# Patient Record
Sex: Male | Born: 1950 | Race: White | Hispanic: No | Marital: Single | Smoking: Former smoker
Health system: Southern US, Community
[De-identification: ages and names within clinical notes are randomized; demographics above are authoritative.]

## PROBLEM LIST (undated history)

## (undated) DIAGNOSIS — E119 Type 2 diabetes mellitus without complications: Secondary | ICD-10-CM

## (undated) DIAGNOSIS — K219 Gastro-esophageal reflux disease without esophagitis: Secondary | ICD-10-CM

## (undated) DIAGNOSIS — J449 Chronic obstructive pulmonary disease, unspecified: Secondary | ICD-10-CM

## (undated) DIAGNOSIS — J8410 Pulmonary fibrosis, unspecified: Secondary | ICD-10-CM

## (undated) DIAGNOSIS — R0600 Dyspnea, unspecified: Secondary | ICD-10-CM

## (undated) DIAGNOSIS — M069 Rheumatoid arthritis, unspecified: Secondary | ICD-10-CM

## (undated) DIAGNOSIS — I10 Essential (primary) hypertension: Secondary | ICD-10-CM

## (undated) HISTORY — PX: NECK SURGERY: SHX720

## (undated) HISTORY — DX: Dyspnea, unspecified: R06.00

## (undated) HISTORY — DX: Rheumatoid arthritis, unspecified: M06.9

## (undated) HISTORY — PX: EYE SURGERY: SHX253

## (undated) HISTORY — DX: Type 2 diabetes mellitus without complications: E11.9

## (undated) HISTORY — DX: Pulmonary fibrosis, unspecified: J84.10

---

## 2003-03-13 ENCOUNTER — Ambulatory Visit (HOSPITAL_COMMUNITY): Admission: RE | Admit: 2003-03-13 | Discharge: 2003-03-13 | Payer: Self-pay | Admitting: Orthopedic Surgery

## 2003-03-27 DIAGNOSIS — M069 Rheumatoid arthritis, unspecified: Secondary | ICD-10-CM

## 2003-03-27 HISTORY — DX: Rheumatoid arthritis, unspecified: M06.9

## 2003-04-30 ENCOUNTER — Inpatient Hospital Stay (HOSPITAL_COMMUNITY): Admission: RE | Admit: 2003-04-30 | Discharge: 2003-05-02 | Payer: Self-pay | Admitting: Neurosurgery

## 2003-05-10 ENCOUNTER — Ambulatory Visit (HOSPITAL_COMMUNITY): Admission: RE | Admit: 2003-05-10 | Discharge: 2003-05-10 | Payer: Self-pay | Admitting: Neurosurgery

## 2004-06-22 ENCOUNTER — Ambulatory Visit (HOSPITAL_BASED_OUTPATIENT_CLINIC_OR_DEPARTMENT_OTHER): Admission: RE | Admit: 2004-06-22 | Discharge: 2004-06-22 | Payer: Self-pay | Admitting: Orthopedic Surgery

## 2004-06-22 ENCOUNTER — Ambulatory Visit (HOSPITAL_COMMUNITY): Admission: RE | Admit: 2004-06-22 | Discharge: 2004-06-22 | Payer: Self-pay | Admitting: Orthopedic Surgery

## 2004-06-22 ENCOUNTER — Encounter (INDEPENDENT_AMBULATORY_CARE_PROVIDER_SITE_OTHER): Payer: Self-pay | Admitting: *Deleted

## 2004-07-03 ENCOUNTER — Ambulatory Visit: Payer: Self-pay | Admitting: Infectious Diseases

## 2007-12-19 ENCOUNTER — Ambulatory Visit (HOSPITAL_COMMUNITY): Admission: RE | Admit: 2007-12-19 | Discharge: 2007-12-19 | Payer: Self-pay | Admitting: Internal Medicine

## 2007-12-30 ENCOUNTER — Ambulatory Visit: Payer: Self-pay | Admitting: Internal Medicine

## 2007-12-30 DIAGNOSIS — J8410 Pulmonary fibrosis, unspecified: Secondary | ICD-10-CM | POA: Insufficient documentation

## 2008-02-12 ENCOUNTER — Ambulatory Visit: Payer: Self-pay | Admitting: Internal Medicine

## 2008-03-17 ENCOUNTER — Telehealth (INDEPENDENT_AMBULATORY_CARE_PROVIDER_SITE_OTHER): Payer: Self-pay | Admitting: *Deleted

## 2008-03-26 HISTORY — PX: OTHER SURGICAL HISTORY: SHX169

## 2008-06-24 ENCOUNTER — Telehealth (INDEPENDENT_AMBULATORY_CARE_PROVIDER_SITE_OTHER): Payer: Self-pay | Admitting: *Deleted

## 2008-07-29 ENCOUNTER — Ambulatory Visit: Payer: Self-pay | Admitting: Internal Medicine

## 2008-07-29 ENCOUNTER — Encounter: Payer: Self-pay | Admitting: Internal Medicine

## 2008-08-19 ENCOUNTER — Encounter (INDEPENDENT_AMBULATORY_CARE_PROVIDER_SITE_OTHER): Payer: Self-pay | Admitting: Orthopedic Surgery

## 2008-08-19 ENCOUNTER — Ambulatory Visit (HOSPITAL_BASED_OUTPATIENT_CLINIC_OR_DEPARTMENT_OTHER): Admission: RE | Admit: 2008-08-19 | Discharge: 2008-08-19 | Payer: Self-pay | Admitting: Orthopedic Surgery

## 2009-05-26 ENCOUNTER — Encounter: Payer: Self-pay | Admitting: Internal Medicine

## 2009-06-10 ENCOUNTER — Ambulatory Visit: Payer: Self-pay | Admitting: Internal Medicine

## 2009-08-02 ENCOUNTER — Encounter: Payer: Self-pay | Admitting: Internal Medicine

## 2009-10-10 ENCOUNTER — Ambulatory Visit: Payer: Self-pay | Admitting: Internal Medicine

## 2009-10-10 DIAGNOSIS — R05 Cough: Secondary | ICD-10-CM | POA: Insufficient documentation

## 2010-01-03 ENCOUNTER — Telehealth (INDEPENDENT_AMBULATORY_CARE_PROVIDER_SITE_OTHER): Payer: Self-pay | Admitting: *Deleted

## 2010-01-04 ENCOUNTER — Encounter: Payer: Self-pay | Admitting: Internal Medicine

## 2010-01-10 ENCOUNTER — Ambulatory Visit: Payer: Self-pay | Admitting: Internal Medicine

## 2010-04-04 ENCOUNTER — Encounter: Payer: Self-pay | Admitting: Internal Medicine

## 2010-04-18 ENCOUNTER — Encounter: Payer: Self-pay | Admitting: Internal Medicine

## 2010-04-18 ENCOUNTER — Ambulatory Visit
Admission: RE | Admit: 2010-04-18 | Discharge: 2010-04-18 | Payer: Self-pay | Source: Home / Self Care | Attending: Internal Medicine | Admitting: Internal Medicine

## 2010-04-25 NOTE — Assessment & Plan Note (Signed)
Summary: Pulmonary/ ext ov    Copy to:  Dr. Perrin Maltese Primary Provider/Referring Provider:  Perrin Maltese  CC:  Followup per Dr Corliss Skains.  Pt states no complaints today.  Breathing fine.  Marland Kitchen  History of Present Illness: 60   yowm  with RA quit smoking  11/09   wt between 200 -215 when  quit smoking.   60 December 30, 2007  seen at Dr Ernestene Mention request for ? PF  after being seen for a "cold" now back to normal. denies doe/  problems at work climbing up and down step but not aerobically active and has learned to take his time.     Jul 29, 2008 ov denies limiting doe no cough, arthtriits is  controlled with as needed indomethacin "a couple of times a year"    June 10, 2009 cc cough Followup per Dr Corliss Skains.  Pt states no complaints today.  Breathing fine.  Coughing is sporadic day and night with no real pattern.  Pt denies any significant sore throat, dysphagia, itching, sneezing,  nasal congestion or excess secretions,  fever, chills, sweats, unintended wt loss, pleuritic or exertional cp, hempoptysis, change in activity tolerance  orthopnea pnd or leg swelling. Pt also denies any obvious fluctuation in symptoms with weather or environmental change or other alleviating or aggravating factors.       Current Medications (verified): 1)  Humira 20 Mg/0.49ml Kit (Adalimumab) .... As Directed Every 2 Wks.  Allergies (verified): No Known Drug Allergies  Past History:  Past Medical History: Rhematoid arthritis 2005.........................................................Marland KitchenDeveshwar Pulmonary fibrosis ? 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 Nl external ear canals without cough reflex Neck without JVD/Nodes/TM Lungs subtle bronhovesicular changes in bases with minimal insp crackles  without cough on insp or exp maneuvers RRR no s3 or murmur or increase in P2. No edema Abd soft and benign with nl excursion in the supine position. 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  See instructions for specific recommendations   Medications Added to Medication List This Visit: 1)  Humira 20 Mg/0.20ml Kit (Adalimumab) .... As directed every 2 wks.  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 Patient Level IV (02725) - Signed

## 2010-04-25 NOTE — Assessment & Plan Note (Signed)
Summary: Pulmonary/ ext ov with sats = 86% p one lap just started pred    Copy to:  Dr. Perrin Mccarthy Primary Provider/Referring Provider:  Perrin Mccarthy  CC:  Cough- the same.  History of Present Illness: 59   yowm  with RA quit smoking  11/09   wt between 200 -215 when  quit smoking.   December 30, 2007  seen at Dr [name] request for ? PF  after being seen for a "cold" now back to normal. denies doe/  problems at work climbing up and down step but not aerobically active and has learned to take his time.     Jul 29, 2008 ov denies limiting doe no cough, arthtriits is  controlled with as needed indomethacin "a couple of times a year"    June 10, 2009 cc cough Followup per Dr [name].  Pt states no complaints today.  Breathing fine.  Coughing is sporadic day and night with no real pattern.    October 10, 2009 Followup with PFT's.  Pt states breathing is fine.  He states that he coughs "all the time"- non prod.  no real pattern to cough but mostly daytime.  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 says no one ever explains anything to him (see quote from instructions July 18).  Pt denies any significant sore throat, dysphagia, itching, sneezing,  nasal congestion or excess secretions,  fever, chills, sweats, unintended wt loss, pleuritic or exertional cp, hempoptysis, change in activity tolerance  orthopnea pnd or leg swelling   Current Medications (verified): 1)  Humira 20 Mg/0.57ml Kit (Adalimumab) .... As  Directed Every 2 Wks. 2)  Azathioprine 50 Mg Tabs (Azathioprine) .Marland Kitchen.. 1 Two Times A Day 3)  Prednisone 5 Mg Tabs (Prednisone) .Marland Kitchen.. 1 Once Daily  Allergies (verified): No Known Drug Allergies  Past History:  Past Medical History: Rhematoid arthritis 2005.........................................................Marland KitchenDeveshwar Pulmonary fibrosis ? secondary to RA...................................Marland KitchenWert    - CT chest 05/10/2007    - CT chest 12/19/2007    - PFTs 02/12/08 vital capacity 3.45 or 76% predicted DLCO 69% predicted    - PFT's   07/29/08  vital capacity 3.33  or 74%                DLC0   55%    - PFT's   10/10/09 vital capacity2.72 or 75% pred and DLC0   44% with desat x 1 lap to 85%    - Desat tp 86 with pulse 110 p one lap  Vital Signs:  Patient profile:   60 year old male Weight:      220 pounds O2 Sat:      93 % on Room air Temp:     97.7 degrees F oral Pulse rate:   96 / minute BP sitting:   130 / 90  (left arm)  Vitals Entered By: [name] RCP, LPN (January 10, 2010 11:41 AM)  O2 Flow:  Room air  Serial Vital Signs/Assessments:  Comments: 11:59 AM Ambulatory Pulse Oximetry  Resting; HR__94___    02 Sat__105___  Lap1 (185 feet)   HR__110___   02 Sat__86%ra___ Lap2 (185 feet)   HR_____   02 Sat_____    Lap3 (185 feet)   HR_____   02 Sat_____  ___Test Completed without Difficulty _x__Test Stopped due to: pt c/o feeling SOB and o2 sats dropped- sat increased form 86%ra to 94%ra after resting approx 20 sec.   By: [name]   CC: Cough- the same Comments Medications reviewed with patient [name] RCP, LPN  January 10, 2010 11:41 AM    Physical Exam  Additional Exam:  obese stoic wm answers questions with yes and no only or grunts with unusual affect, and  to answer a single question asked in a straightforward manner, tending to go off on tangents or answer questions with ambiguous medical terms or diagnoses and seemed upset when asked the same  question more than once for clarification.  wt 219 December 30, 2007  > 218 Jul 29, 2008 > 219 June 10, 2009 > 215 October 10, 2009 > 220 January 11, 2010  HEENT: nl dentition, turbinates, and orophanx. Nl external ear canals without cough reflex Neck without JVD/Nodes/TM Lungs subtle bronhovesicular changes in bases with minimal insp crackles  without cough on insp or exp maneuvers RRR no s3 or murmur or increase in P2. No edema Abd soft and benign with nl excursion in the supine position. 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) I had an extended discussion with the patient today lasting 15 to 20 minutes of a 25 minute visit on the following issues:   Discussed in detail all the  indications, usual  risks and alternatives  relative to the benefits with patient who agrees to proceed with conservative fu,  though he seemed to have a great deal of difficulty absorbing these details requiring [place] to go over the same issues multiple times On this visit he denied we had the same conversation last time, denied he'd been told anything about having ild related to RA (given written comments as well which calls into question whetether he has occult dementia or illiteracy).  For now will step up f/u to q 3 months with 02 sats on each ov  (slt better today and too early to tell about response to pred trial)  Problem # 2:  COUGH (ICD-786.2)  Not typical of ILD since no relation to inspiratory activity at rest or with ex  The most common causes of chronic cough in immunocompetent adults include: upper airway cough syndrome (UACS), previously referred to as postnasal drip syndrome,  caused by variety of rhinosinus conditions; (2) asthma; (3) GERD; (4) chronic bronchitis from cigarette smoking or other inhaled environmental irritants; (5)  nonasthmatic eosinophilic bronchitis; and (6) bronchiectasis. These conditions, singly or in combination, have accounted for up to 94% of the causes of chronic cough in prospective studies.   Try max gerd rx if prednisone has no effect on cough  Orders: Est. Patient Level IV (45409)  Medications Added to Medication List This Visit: 1)  Prednisone 5 Mg Tabs (Prednisone) .Marland Kitchen.. 1 once daily  Other Orders: Pulse Oximetry, Ambulatory (81191)  Patient Instructions: 1)  See first if the prednisone helps (2 weeks) and if not  I recommend you take prilosec 20 mg  Take one 30-60 min before first and last meals of the day and pepcid 20 mg at bedtime for at least two weeks if not responding  2)  Keep follow up with Deveswar 3)  January 2011 follow up office visit with cxr and pft's

## 2010-04-25 NOTE — Letter (Signed)
Summary: [facility]  [facility]   Imported By: Lester [place] 01/19/2010 08:52:50  _____________________________________________________________________  External Attachment:    Type:   Image     Comment:   External Document

## 2010-04-25 NOTE — Miscellaneous (Signed)
Summary: Orders Update pft charges  Clinical Lists Changes  Orders: Added new Service order of Carbon Monoxide diffusing w/capacity (94720) - Signed Added new Service order of Lung Volumes (94240) - Signed Added new Service order of Spirometry (Pre & Post) (94060) - Signed 

## 2010-04-25 NOTE — Assessment & Plan Note (Signed)
Summary: Pulmonary/ ext summary f/u ov for RA/PFnow desat walking 185   Copy to:  Dr. Perrin Maltese Primary Provider/Referring Provider:  Perrin Maltese  CC:  Followup with PFT's.  Pt states breathing is fine.  He states that he coughs "all the time"- non prod .  History of Present Illness: 43   yowm  with RA quit smoking  11/09   wt between 200 -215 when  quit smoking.   December 30, 2007  seen at Dr Ernestene Mention request for ? PF  after being seen for a "cold" now back to normal. denies doe/  problems at work climbing up and down step but not aerobically active and has learned to take his time.     Jul 29, 2008 ov denies limiting doe no cough, arthtriits is  controlled with as needed indomethacin "a couple of times a year"    June 10, 2009 cc cough Followup per Dr Corliss Skains.  Pt states no complaints today.  Breathing fine.  Coughing is sporadic day and night with no real pattern.    October 10, 2009 Followup with PFT's.  Pt states breathing is fine.  He states that he coughs "all the time"- non prod.  no real pattern to cough but mostly daytime. Pt denies any significant sore throat, dysphagia, itching, sneezing,  nasal congestion or excess secretions,  fever, chills, sweats, unintended wt loss, pleuritic or exertional cp, hempoptysis, change in activity tolerance  orthopnea pnd or leg swelling Pt also denies any obvious fluctuation in symptoms with weather or environmental change or other alleviating or aggravating factors.       Current Medications (verified): 1)  Humira 20 Mg/0.41ml Kit (Adalimumab) .... As Directed Every 2 Wks. 2)  Azathioprine 50 Mg Tabs (Azathioprine) .Marland Kitchen.. 1 Two Times A Day  Allergies (verified): No Known Drug Allergies  Past History:  Past Medical History: Rhematoid arthritis 2005.........................................................Marland KitchenDeveshwar Pulmonary fibrosis ? secondary to RA...................................Marland KitchenWert    - CT chest 05/10/2007    - CT chest 12/19/2007    - PFTs  02/12/08 vital capacity 3.45 or 76% predicted DLCO 69% predicted    - PFT's   07/29/08  vital capacity 3.33  or 74%                DLC0   55%    - PFT's   10/10/09 vital capacity2.72 or 75% pred and DLC0   44% with desat x 1 lap  Vital Signs:  Patient profile:   60 year old male Weight:      215 pounds O2 Sat:      95 % on Room air Temp:     98.0 degrees F oral Pulse rate:   111 / minute BP sitting:   120 / 72  (left arm)  Vitals Entered By: Vernie Murders (October 10, 2009 4:28 PM)  O2 Flow:  Room air  Serial Vital Signs/Assessments:  Comments: 4:58 PM Ambulatory Pulse Oximetry  Resting; HR__99___    02 Sat__91%ra___  Lap1 (185 feet)   HR_113____   02 Sat__85%ra___ Lap2 (185 feet)   HR_____   02 Sat_____    Lap3 (185 feet)   HR_____   02 Sat_____  ___Test Completed without Difficulty _x__Test Stopped due to: decreased o2 sats- pt SOB, placed on o2 2lpm and sat increased to 93%2lpm   By: Vernie Murders    Physical Exam  Additional Exam:  obese stoic wm answers questions with yes and no only or grunts with unusual affect wt 219 December 30, 2007  > 218 Jul 29, 2008 > 219 June 10, 2009 > 215 October 10, 2009  HEENT: nl dentition, turbinates, and orophanx. Nl external ear canals without cough reflex Neck without JVD/Nodes/TM Lungs subtle bronhovesicular changes in bases with minimal insp crackles  without cough on insp or exp maneuvers RRR no s3 or murmur or increase in P2. No edema Abd soft and benign with nl excursion in the supine position. No bruits or organomegaly Ext warm without calf tenderness, cyanosis clubbing or extensor nodules    CXR  Procedure date:  10/10/2009  Findings:        CHEST - 2 VIEW 10/10/2009:   Comparison: Two-view chest x-ray 07/29/2008 and 04/30/2003.  CT chest 05/10/2003 and 12/19/2007   Findings: Severe diffuse interstitial lung disease, basilar predominant, with associated low lung volumes consistent with pulmonary fibrosis.  Findings  have progressed since the examination from 07/2008.  No confluent airspace consolidation.  No pleural effusions.  Heart  mildly enlarged but stable dating back to 04/2003.  Hilar and mediastinal contours otherwise unremarkable. 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   IMPRESSION: Interstitial pulmonary fibrosis, progressive since May, 2010.  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)  I had an extended discussion with the patient today lasting 15 to 20 minutes of a 25 minute visit on the following issues:   Discussed in detail all the  indications, usual  risks and alternatives  relative to the benefits with patient who agrees to proceed with conservative fu,  though he seemed to have a great deal of difficulty absorbing these details requiring [place] to go over the same issues multiple times On this visit he denied we had the same conversation last time, denied he'd been told anything about having ild related to RA (given written comments as well which calls into question whetether he has occult dementia or illiteracy).  For now will step up f/u to q 3 months with 02 sats on each ov and let Dr Corliss Skains know he's slowly deteriorating ? will need a steroid trial at some point?   Problem # 2:  COUGH (ICD-786.2) Not typical of ILD since no relation to inspiratory activity.  Medications Added to Medication List This Visit: 1)  Azathioprine 50 Mg Tabs (Azathioprine) .Marland Kitchen.. 1 two times a day  Other Orders: T-2 View CXR (71020TC) Est. 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

## 2010-04-25 NOTE — Letter (Signed)
Summary: [facility]  [facility]   Imported By: [name] 08/10/2009 09:52:21  _____________________________________________________________________  External Attachment:    Type:   Image     Comment:   External Document

## 2010-04-25 NOTE — Letter (Signed)
Summary: [facility]  [facility]   Imported By: Sherian Rein 08/26/2009 09:53:26  _____________________________________________________________________  External Attachment:    Type:   Image     Comment:   External Document

## 2010-04-25 NOTE — Progress Notes (Signed)
Summary: needs rov with walking sats-  ---- Converted from flag ---- ---- 10/10/2009 7:30 PM, Nyoka Cowden MD wrote: f/u ov with walking sat due ------------------------------  ATC pt NA LMOMTCB Vernie Murders  January 04, 2010 12:48 PM Spoke with pt and advised to keep planned followup with MW for 01/10/10 at 11:45 am.  Pt states that he will keep appt. Vernie Murders  January 04, 2010 4:11 PM

## 2010-04-27 NOTE — Letter (Signed)
Summary: [facility]  [address]   [place], [place] [address]   Phone: [phone]  Fax: [phone]    04/18/2010  LADDIE MATH [address] [place], [place]  [address]  Dear Mr. BRIGG, CAPE Prilosec 20 mg Take  one 30-60 min before first meal of the day  and pepcid at bedtime.  Take these automatically daily to reduce risk of acid reflux worsening your fibrosis.  Your exercise oxygen level is no worse so ok to do paced exercise at the level you did today.  Return to office in 3 months, sooner if needed       Sincerely,   Sandrea Hughs MD

## 2010-04-27 NOTE — Letter (Signed)
Summary: [facility]  [facility]   Imported By: Lennie Odor 04/13/2010 09:21:35  _____________________________________________________________________  External Attachment:    Type:   Image     Comment:   External Document

## 2010-04-27 NOTE — Assessment & Plan Note (Signed)
Summary: Pulmonary/ f/u PF   Vital Signs:  Patient profile:   60 year old male Weight:      209 pounds BMI:     30.98 O2 Sat:      92 % on Room air Temp:     98.4 degrees F oral Pulse rate:   115 / minute BP sitting:   122 / 88  (right arm)  Vitals Entered By: Vernie Murders (April 18, 2010 5:03 PM)  O2 Flow:  Room air  Serial Vital Signs/Assessments:  Comments: 5:04 PM Ambulatory Pulse Oximetry  Resting; HR__107___    02 Sat__95%ra___  Lap1 (185 feet)   HR__115___   02 Sat__91%ra___ Lap2 (185 feet)   HR__119___   02 Sat__87%ra___    Lap3 (185 feet)   HR_____   02 Sat_____  ___Test Completed without Difficulty _x__Test Stopped due HK:[id number] o2 sats   By: Vernie Murders    Copy to:  Dr. Perrin Maltese Primary Provider/Referring Provider:  Guest  CC:  Cough- no change.  History of Present Illness: 66   yowm  with RA quit smoking  11/09   wt between 200 -215 when  quit smoking.   December 30, 2007  seen at Dr Ernestene Mention request for ? PF  after being seen for a "cold" now back to normal. denies doe/  problems at work climbing up and down step but not aerobically active and has learned to take his time.     Jul 29, 2008 ov denies limiting doe no cough, arthtriits is  controlled with as needed indomethacin "a couple of times a year"    June 10, 2009 cc cough Followup per Dr Corliss Skains.  Pt states no complaints today.  Breathing fine.  Coughing is sporadic day and night with no real pattern.    October 10, 2009 Followup with PFT's.  Pt states breathing is fine.  He states that he coughs "all the time"- non prod.  no real pattern to cough but mostly daytime.  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 says no one ever explains anything to him (see quote from instructions July 18).   rec trial of prednisone > no better, trial of gerd rx > no better  April 19, 2010 ov cough the same, ex tol the same. Pt denies any significant sore throat, dysphagia, itching, sneezing,  nasal congestion or excess secretions,  fever, chills, sweats, unintended wt loss, pleuritic or exertional cp, hempoptysis, variability in activity tolerance  orthopnea pnd or leg swelling   Current Medications (verified): 1)  Humira 20 Mg/0.78ml Kit (Adalimumab) .... As Directed Every 2 Wks.  Allergies (verified): No Known Drug Allergies  Past History:  Past Medical History: Rhematoid arthritis 2005.........................................................Marland KitchenDeveshwar Pulmonary fibrosis ? secondary to RA...................................Marland KitchenWert    - CT chest 05/10/2007    - CT chest 12/19/2007    - PFTs 02/12/08 vital capacity 3.45 or 76% predicted DLCO 69% predicted    -  PFT's   07/29/08  vital capacity 3.33  or 74%                DLC0   55%    - PFT's   10/10/09 vital capacity2.72 or 75% pred and DLC0   44% with desat x 1 lap to 85%    - April 18, 2010 desats p 2 laps RA to 87%  Physical Exam  Additional Exam:  obese stoic wm answers questions with yes and no only or grunts with unusual affect, and  to answer a single question asked in a straightforward manner, tending to go off on tangents or answer questions with ambiguous medical terms or diagnoses and seemed upset when asked the same question more than once for clarification.  wt 219 December 30, 2007  > 218 Jul 29, 2008 > 219 June 10, 2009  > 220 January 11, 2010 > 209 April 19, 2010  HEENT: nl dentition, turbinates, and orophanx. Nl external ear canals  without cough reflex Neck without JVD/Nodes/TM Lungs subtle bronhovesicular changes in bases with minimal insp crackles  without cough on insp or exp maneuvers RRR no s3 or murmur or increase in P2. No edema Abd soft and benign with nl excursion in the supine position. 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  This may not be cause and effect, but given how universally unhelpful all the otherstudy drugs have been for pf,   rec start  rx ppi / diet/ lifestyle modification.    Other Orders: Est. Patient Level III (04540) Pulse Oximetry, Ambulatory (98119)  Patient Instructions: 1)  Restart Prilosec 20 mg Take  one 30-60 min before first meal of the day  2)  and pepcid at bedtime.  Take these automatically daily to reduce risk of acid reflux worsening your fibrosis. 3)  Your exercise oxygen level is no worse so ok to do paced exercise at the level you did today. 4)  Return to office in 3 months, sooner if needed    Orders Added: 1)  Est. Patient Level III [14782] 2)  Pulse Oximetry, Ambulatory [95621]

## 2010-06-19 ENCOUNTER — Encounter: Payer: Self-pay | Admitting: Internal Medicine

## 2010-06-23 ENCOUNTER — Encounter: Payer: Self-pay | Admitting: Internal Medicine

## 2010-06-23 ENCOUNTER — Ambulatory Visit (INDEPENDENT_AMBULATORY_CARE_PROVIDER_SITE_OTHER): Payer: Managed Care, Other (non HMO) | Admitting: Internal Medicine

## 2010-06-23 VITALS — BP 112/80 | HR 96 | Temp 97.6°F | Ht 69.0 in | Wt 207.8 lb

## 2010-06-23 DIAGNOSIS — J8410 Pulmonary fibrosis, unspecified: Secondary | ICD-10-CM

## 2010-06-23 NOTE — Patient Instructions (Signed)
Pace yourself when walking and let us know if you loose ground.  Otherwise we'll see you in 3 months with PFT's

## 2010-06-23 NOTE — Assessment & Plan Note (Addendum)
I had an extended discussion with the patient today lasting 15 to 20 minutes of a 25 minute visit on the following issues:   Pt continues to show extremely limited insight into his condition stating "I'm only here because you told me to come back" despite now for the 3rd time reviewing concept of lung as an innocent bystander in setting of what appears to be Humira responsive RA with if anything improvement in symptoms and activity sats on present regimen but continued need to follow with serial cxr's pft's and sats and look for any evidence of decomp related to RA activity vs infection.

## 2010-06-23 NOTE — Progress Notes (Signed)
Subjective:    Patient ID: [name], male    DOB: 1950-07-04, 60 y.o.   MRN: [id number]  HPI   76 yowm with RA quit smoking 11/09 wt between 200 -215 when quit smoking.   December 30, 2007 seen at Dr Ernestene Mention request for ? PF after being seen for a "cold" now back to normal. denies doe/ problems at work climbing up and down step but not aerobically active and has learned to take his time.   Jul 29, 2008 ov denies limiting doe no cough, arthtriits is controlled with as needed indomethacin "a couple of times a year"   June 10, 2009 cc cough Followup per Dr Corliss Skains. Pt states no complaints today. Breathing fine. Coughing is sporadic day and night with no real pattern.   October 10, 2009 Followup with PFT's. Pt states breathing is fine. He states that he coughs "all the time"- non prod. no real pattern to cough but mostly daytime. 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 says no one ever explains anything to him (see quote from instructions July 18). rec trial of prednisone > no better, trial of gerd rx > no better   April 19, 2010 ov cough the same, ex tol the same rec no change rx > rec max gerd rx for cough > did not follow recs  06/23/2010 ov cc cough and doe no change in baseline.  No excess mucus, no noct resp c/o's.    Pt denies any significant sore throat, dysphagia, itching, sneezing,  nasal congestion or excess/ purulent secretions,  fever, chills, sweats, unintended wt loss, pleuritic or exertional cp, hempoptysis, orthopnea pnd or leg swelling.    Also denies any obvious fluctuation of symptoms with weather or  environmental changes or other aggravating or alleviating factors.       Past Medical History:  Rhematoid arthritis 2005.........................................................Marland KitchenDeveshwar  Pulmonary fibrosis ? secondary to RA...................................Marland KitchenWert  - CT chest 05/10/2007  - CT chest 12/19/2007  - PFTs 02/12/08 vital capacity 3.45 or 76% predicted DLCO 69% predicted  - PFT's 07/29/08 vital capacity 3.33 or 74% DLC0 55%  - PFT's 10/10/09 vital capacity2.72 or 75% pred and DLC0 44% with desat x 1 lap to 85%  - April 18, 2010 desats p 2 laps RA to 87% > repeat 06/23/2010 no desat p 2 laps, desat on 3rd          Review of Systems     Objective:   Physical Exam obese stoic wm answers questions with yes and no only or grunts with unusual affect,  tion.  wt 219 December 30, 2007 > 218 Jul 29, 2008 > 219 June 10, 2009 > 207 06/23/2010  HEENT: nl dentition, turbinates, and orophanx. Nl external ear canals without cough reflex  Neck without JVD/Nodes/TM  Lungs subtle bronhovesicular changes in bases with minimal insp crackles without cough on insp or exp maneuvers  RRR no s3 or murmur or increase in P2. No edema  Abd soft and benign with nl excursion in the supine position. No bruits or organomegaly  Ext warm without calf tenderness, cyanosis clubbing  or extensor nodules         Assessment & Plan:

## 2010-06-28 ENCOUNTER — Other Ambulatory Visit: Payer: Self-pay | Admitting: Family Medicine

## 2010-06-28 ENCOUNTER — Ambulatory Visit
Admission: RE | Admit: 2010-06-28 | Discharge: 2010-06-28 | Disposition: A | Discharge status: Home / Self Care | Payer: Managed Care, Other (non HMO) | Source: Ambulatory Visit | Attending: Family Medicine | Admitting: Family Medicine

## 2010-06-28 DIAGNOSIS — M0510 Rheumatoid lung disease with rheumatoid arthritis of unspecified site: Secondary | ICD-10-CM

## 2010-06-28 MED ORDER — IOHEXOL 300 MG/ML  SOLN
75.0000 mL | Freq: Once | INTRAMUSCULAR | Status: AC | PRN
  Administered 2010-06-28: 75 mL via INTRAVENOUS

## 2010-07-04 LAB — AFB CULTURE WITH SMEAR (NOT AT ARMC)
Acid Fast Smear: NONE SEEN
Acid Fast Smear: NONE SEEN

## 2010-07-04 LAB — FUNGUS CULTURE W SMEAR
Fungal Smear: NONE SEEN
Fungal Smear: NONE SEEN

## 2010-07-04 LAB — ANAEROBIC CULTURE: Gram Stain: NONE SEEN

## 2010-07-04 LAB — TISSUE CULTURE: Gram Stain: NONE SEEN

## 2010-07-04 LAB — POCT HEMOGLOBIN-HEMACUE: Hemoglobin: 15.4 g/dL (ref 13.0–17.0)

## 2010-08-08 NOTE — Op Note (Signed)
[name], [name]               ACCOUNT NO.:  [id number]   MEDICAL RECORD NO.:  [id number]          PATIENT TYPE:  AMB   LOCATION:  DSC                          FACILITY:  [facility]   PHYSICIAN:  Katy Fitch. Sypher, M.D. DATE OF BIRTH:  1950/08/26   DATE OF PROCEDURE:  08/19/2008  DATE OF DISCHARGE:                               OPERATIVE REPORT   PREOPERATIVE DIAGNOSES:  1. Mass, right olecranon, dorsal surface #1 with background history of      inflammatory lung disease and possible chronic arthritis.  2. Chronic right olecranon bursitis.   POSTOPERATIVE DIAGNOSIS:  1. Mass, right olecranon, dorsal surface #1 with background history of      inflammatory lung disease and possible chronic arthritis.  2. Chronic right olecranon bursitis.  3. Identification of subcutaneous abscess and granulomatous mass      overlying dorsal right olecranon and chronic fibrinous bursitis,      right olecranon bursa.   OPERATION:  1. Incision and drainage of abscess with cultures for aerobic,      anaerobic, and AFB.  2. Is resection of granulomatous mass, sent for histopathologic      evaluation and AFB smear.  3. Excision of olecranon bursa with culture and histopathologic      evaluation.   OPERATIONS:  Katy Fitch. Sypher, MD.   ASSISTANT:  Marveen Reeks Dasnoit, PA-C   ANESTHESIA:  Lidocaine 2% field block of right elbow supplemented by  sedation.   SUPERVISING ANESTHESIOLOGIST:  Zenon Mayo, MD   INDICATIONS:  [name] is a 60 year old gentleman well acquainted  with our practice.  He has a history of inflammatory lung disease and a  history of probable gout.  He has had a chronic right olecranon bursitis  and developed a mass in the subcutaneous region overlying the bursa.  He  had previously had nodules excised from his elbow that were  nondiagnostic.  At the present time, he appears to have 2 pathologic  prostheses.  He has an underlying olecranon bursitis what appears to be  an infected skin lesion and abscess.  His abscess was cool and might  represent an atypical infection.   We recommended incision and drainage of the abscess, culture, and  olecranon bursectomy in an effort to identify the pathologic process and  resolve the pathologic process.   Preoperatively, he was advised to undergo culture biopsy and bursal  debridement, questions were invited and answered in detail.   Preoperatively, he was evaluated by Dr. Sampson Goon and Dr. Michelle Piper.  He  was recommended local anesthesia and sedation and accepted the same.   He was brought to room 8 at this time.   PROCEDURE:  Maricela Kawahara was brought to the operating room and placed  in supine position upon the table.   Following sedation, the right arm was prepped with Betadine soap  solution and sterilely draped.   Lidocaine 2% was infiltrated beneath the olecranon bursa and beneath the  skin surrounding the abscess.   The arm was then exsanguinated with an Esmarch bandage arterial  tourniquet inflated to 250 mmHg.  Procedure commenced  with an elliptical  resection of the abscess on block.  This was taken down to the level the  bursa, however, the bursa was not entered.  The tissue containing the  abscess was removed from the operative field and placed on the table,  incised and purulent material recovered.  This cultured for aerobic,  anaerobic, and AFB.  The abscess and the granuloma surrounding it was  then bisected, park put in formalin for histopathologic evaluation.  Part put in a sterile cup for culture.  We then cleared the field and  with new instruments, meticulously resected the olecranon bursa.  This  was noted to be granulomatous as well with hypertrophic nodules within  the bursa.  This was sent for histopathologic evaluation and culture.   The wound was then inspected for bleeding points, followed by repair of  the skin with subcutaneous suture of 3-0 Vicryl and intradermal 3-0  Prolene  with Steri-Strips.   A compressive dressing was applied with Steri-Strips, sterile gauze, and  Tegaderm followed by Ace wrap.  There were no apparent complications.      Katy Fitch Sypher, M.D.  Electronically Signed     RVS/MEDQ  D:  08/19/2008  T:  08/20/2008  Job:  [id number]   cc:   Pollyann Savoy, M.D.  Jonita Albee, M.D.  Charlaine Dalton. Sherene Sires, MD, FCCP

## 2010-08-11 NOTE — H&P (Signed)
NAME:  [name], [name]                         ACCOUNT NO.:  [id number]   MEDICAL RECORD NO.:  [id number]                   PATIENT TYPE:  INP   LOCATION:  NA                                   FACILITY:  [facility]   PHYSICIAN:  Hilda Lias, M.D.                DATE OF BIRTH:  [birth year]   DATE OF ADMISSION:  DATE OF DISCHARGE:                                HISTORY & PHYSICAL   HISTORY OF PRESENT ILLNESS:  This is a gentleman who came to see me because  of weakness in the left hand. This had been going on since November last  year, and he complains of numbness and weakness, mostly involved in the  fourth and fifth finger of the left hand. He had a complete workup, and MRI  was obtained, and because of the findings, he was sent to [place] for evaluation.  He has mild pain. Most of the problem is weakness.   PAST MEDICAL HISTORY:  Negative.   ALLERGIES:  He is not allergic to medication.   SOCIAL HISTORY:  He smoked half a pack of cigarettes a day. He drinks  socially.   FAMILY HISTORY:  Negative.   REVIEW OF SYSTEMS:  Negative except for neck pain.   PHYSICAL EXAMINATION:  HEENT:  Normal.  NECK:  Normal.  LUNGS:  Clear.  HEART:  Sounds normal.  ABDOMEN:  Normal.  EXTREMITIES:  Normal pulses.  NEUROLOGICAL:  Mental status normal. Cranial nerves normal. Strength:  He  has normal deltoid. I can break both resistances of both biceps, especially  the left one. He has a normal thenar muscle but he has quite a bit of  weakness of the hypothenar muscle in the right side as well in the left. The  worse is in the left one. Tinel's negative bilaterally. Sensation:  He  complains of numbness of the fourth and fifth fingers of the left hand.  Reflexes:  No Babinski.   STUDIES:  The cervical MRI and the x-ray show that he has a herniated disk  at the C7-T1 central and to the left and spondylosis at the level of 5-6/6-7  with compromise bilaterally. He has a mild herniated disk at the level of  4-  5.   CLINICAL IMPRESSION:  Herniated disk C7-T1 central and to the left with  spondylosis 5-6/6-7.   RECOMMENDATIONS:  I talked to the patient at length. The patient wanted to  proceed with surgery. We are concerned about the amount of weakness of the  __________ level. He knows about the risks of surgery such as infection, CSF  leak, worsening pain, paralysis, need for further surgery, damage to the  esophagus, damage to the trachea, damage to the vertebral artery, and also  the possibility of damage of the lung because of C7-T1.  Hilda Lias, M.D.    EB/MEDQ  D:  04/30/2003  T:  04/30/2003  Job:  [id number]

## 2010-08-11 NOTE — Op Note (Signed)
[name]               ACCOUNT NO.:  [id number]   MEDICAL RECORD NO.:  [id number]          PATIENT TYPE:  AMB   LOCATION:  DSC                          FACILITY:  [facility]   PHYSICIAN:  Katy Fitch. Sypher Montez Hageman., M.D.DATE OF BIRTH:  [birth year]   DATE OF PROCEDURE:  06/22/2004  DATE OF DISCHARGE:                                 OPERATIVE REPORT   PREOPERATIVE DIAGNOSIS:  Irregular, firm mass left elbow and proximal ulnar  border of forearm deep to scar, rule out epidermal inclusion cyst versus  rheumatoid nodule for of granuloma annulare.   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   OPERATION:  Excision of a 4x4 centimeter irregular subdermal and fascial  adherent infiltrative mass ulnar border of proximal left forearm.   OPERATING SURGEON:  Katy Fitch. Sypher, M.D.   ASSISTANT:  Marveen Reeks. Dasnoit, P.A.C.   ANESTHESIA:  General by LMA .   SUPERVISING ANESTHESIOLOGIST:  Sheldon Silvan, M.D.   INDICATIONS:  [name] is a 60 year old man who presented for  evaluation of a mass on the proximal ulnar border of his left forearm.   This had formed insidiously and was deep to a forearm skin scar. Due to the  irregular appearance of this and its firmness, our initial impression that  this could represent a epidermal inclusion cyst, however, given the location  along the ulnar border of the forearm, this could also represent a variant  of rheumatoid nodule or granuloma annulare.   [name] requested resection. We advised to removal for excisional biopsy.   After informed consent, he is brought to the operating room at this time. He  understands that prognosis is dependent on diagnosis and as of yet we do not  have a firm diagnosis.   PROCEDURE:  [name] is brought to operating room and placed in supine  position on the operating table.   Following induction general anesthesia by LMA, left arm was prepped with  Betadine soap and solution and  sterilely  draped.   Following exsanguination of left arm with an Esmarch bandage, an arterial  tourniquet proximal brachium is inflated to 230 mmHg.   Procedure commenced with a 5 cm longitudinal incision over the apex of the  mass. Subcutaneous tissues were carefully divided revealing a very bland  grayish yellow mass that was adherent to the deep surface of the dermis.  This had the appearance of an infiltrative inflammatory type mass. It was  not particularly hypervascular. It was, however, densely fibrotic and  adherent to both the dermis and the periosteum of the ulnar subcutaneous  border as well as the fascia of the extensor carpi ulnaris and flexor carpi  ulnaris.   With great difficulty, this was circumferentially dissected, sparing as many  neurovascular structures as possible. This was ultimately removed directly  from the periosteum of the ulna and the adjacent fascial surfaces of the  extensor carpi ulnaris and flexor carpi ulnaris. Care was taken to palpate  the position of the ulnar nerve while dissecting in the region of the flexor  carpi ulnaris.   The mass did not  appear to be contiguous with the olecranon bursa. This did  have a central necrotic region that was consistent with a probable granuloma  annulare.   The entire mass was placed in formalin abdomen passed off for pathologic  exam. Bleeding points were electrocauterized with bipolar current followed  by repair of the wound with subdermal suture of 3-0 Vicryl and intradermal 3-  0 Prolene with Steri-Strips.   A compressive applied with sterile gauze, Webril and Ace wrap. There were no  apparent complications.   [name] is  provided prescriptions for Percocet 5 mg one or two tablets  p.o. q.4-6h. p.r.n. pain 30 tablets without refill. Also Motrin 600 mg one  p.o. q.6h. p.r.n. pain 30 tablets without refill and Keflex 500 mg one p.o.  q.8h. x4 days as a prophylactic antibiotic.      RVS/MEDQ  D:  06/22/2004   T:  06/22/2004  Job:  [id number]

## 2010-08-11 NOTE — Op Note (Signed)
NAME:  [name], [name]                         ACCOUNT NO.:  [id number]   MEDICAL RECORD NO.:  [id number]                   PATIENT TYPE:  INP   LOCATION:  3025                                 FACILITY:  [facility]   PHYSICIAN:  Hilda Lias, M.D.                DATE OF BIRTH:  January 09, 1951   DATE OF PROCEDURE:  04/30/2003  DATE OF DISCHARGE:                                 OPERATIVE REPORT   PREOPERATIVE DIAGNOSIS:  Chronic C7, C6 and C8 radiculopathy secondary to 5-  6, 6-7, 7-1 herniated disks, spondylosis.   POSTOPERATIVE DIAGNOSIS:  Chronic C7, C6 and C8 radiculopathy secondary to 5-  6, 6-7, 7-1 herniated disks, spondylosis.   PROCEDURE:  Anterior 5-6, 6-7, 7-1 diskectomy, decompression of spinal cord,  bilateral foraminotomies, partial corpectomy of C6. Allograft and interbody  fusion. Plate from C5 to thoracic 1. Microscope.   SURGEON:  Hilda Lias, M.D.   INDICATIONS FOR PROCEDURE:  Mr. [name] is a 32 -year-old gentleman who was  seen in my  office because of neck  pain with atrophy of the left triceps  and the hypothenar muscle. The patient by x-ray had severe case of  spondylosis and herniated disk at the level of 5-6, 6-7 and 7-1 with worse  being at the level of C6-7 because of spondylosis. The patient wanted to  proceed with surgery and the risks were explained, including  the  possibility of requiring surgery posterior. The risks were explained in the  history and physical.   DESCRIPTION OF PROCEDURE:  The patient was taken to the operating room and  after adequate sedation, the neck  was prepped with Betadine. A longitudinal  incision was made through the skin  and platysma.   We dissected all the  way to the cervical area. X-ray showed that we were at  the level of the 5-6. From then on we opened the anterior ligament. We  brought the microscope and went down for removal of the disk, decompressing  the spinal cord and bilateral foraminotomy. At the level of  C7-T1,  essentially we did the same procedure with removal of fragments going mostly  to the right side, compromising the C8 nerve root. The nerve root was found  to be swollen and reddish.   The most difficult part was the 6-7 where the patient had a right anterior  osteophyte. We had to drill our way into the disk  and we had to do a  partial corpectomy at C6. The patient had quite a bit of spondylosis with a  large bone spur central and to the left, displacing the C7 nerve root  upward. The C7 nerve root was found to be thin and whitish.   Having good decompression, we drilled the endplate and a piece of bone graft  __________ cm in height __________ was inserted between  the disk space.  This were followed by a plate using  cortical screws. A lateral C-spine  showed  the upper pole of C5-6 normal. The direction was difficult to  visualize. From then on the area was irrigated, a drain was placed and  the  wound was closed with Vicryl and Steri-Strips.                                               Hilda Lias, M.D.    EB/MEDQ  D:  04/30/2003  T:  05/01/2003  Job:  [id number]

## 2010-08-11 NOTE — Discharge Summary (Signed)
NAME:  [name], [name]                         ACCOUNT NO.:  [id number]   MEDICAL RECORD NO.:  [id number]                   PATIENT TYPE:  INP   LOCATION:  3025                                 FACILITY:  [facility]   PHYSICIAN:  Hilda Lias, M.D.                DATE OF BIRTH:  [birth year]   DATE OF ADMISSION:  04/30/2003  DATE OF DISCHARGE:  05/02/2003                                 DISCHARGE SUMMARY   ADMITTING DIAGNOSIS:  Cervical spondylosis, C5 to T1.   DISCHARGE DIAGNOSIS:  Cervical spondylosis, C5 to T1.   PROCEDURE:  Anterior cervical diskectomy and arthrodesis, C5 to T1.   COMPLICATIONS:  None.   DISCHARGE STATUS:  Alive and well.   MEDICATIONS:  Percocet for pain.   HOSPITAL COURSE:  Mr. Justiniano was admitted to the hospital secondary to  cervical stenosis and spondylosis from C5 to T1.  He had an uncomplicated  procedure.  Postoperatively, a drain was placed.  I removed that on May 02, 2003.  The patient's wound is clean, flat, dry, and without signs of  infection.  He is afebrile at discharge.  Voice is normal.  Strength is  excellent in the upper extremities.  Grip is good.  Triceps excellent, as is  biceps function.  He will be discharged home today.   DISCHARGE INSTRUCTIONS:  He will be given an instruction sheet per Dr.  Jeral Fruit for postoperative neck operations.      Coletta Memos, M.D.                        Hilda Lias, M.D.    KC/MEDQ  D:  05/02/2003  T:  05/02/2003  Job:  [id number]

## 2011-02-27 ENCOUNTER — Other Ambulatory Visit: Payer: Self-pay | Admitting: Orthopedic Surgery

## 2011-03-08 ENCOUNTER — Ambulatory Visit (HOSPITAL_BASED_OUTPATIENT_CLINIC_OR_DEPARTMENT_OTHER): Payer: Managed Care, Other (non HMO) | Admitting: Certified Registered Nurse Anesthetist

## 2011-03-08 ENCOUNTER — Encounter (HOSPITAL_BASED_OUTPATIENT_CLINIC_OR_DEPARTMENT_OTHER): Payer: Self-pay | Admitting: Orthopedic Surgery

## 2011-03-08 ENCOUNTER — Encounter (HOSPITAL_BASED_OUTPATIENT_CLINIC_OR_DEPARTMENT_OTHER): Payer: Self-pay | Admitting: Certified Registered Nurse Anesthetist

## 2011-03-08 ENCOUNTER — Encounter (HOSPITAL_BASED_OUTPATIENT_CLINIC_OR_DEPARTMENT_OTHER): Payer: Self-pay | Admitting: *Deleted

## 2011-03-08 ENCOUNTER — Other Ambulatory Visit: Payer: Self-pay | Admitting: Orthopedic Surgery

## 2011-03-08 ENCOUNTER — Ambulatory Visit (HOSPITAL_BASED_OUTPATIENT_CLINIC_OR_DEPARTMENT_OTHER)
Admission: RE | Admit: 2011-03-08 | Discharge: 2011-03-08 | Disposition: A | Discharge status: Home / Self Care | Payer: Managed Care, Other (non HMO) | Source: Ambulatory Visit | Attending: Orthopedic Surgery | Admitting: Orthopedic Surgery

## 2011-03-08 ENCOUNTER — Encounter (HOSPITAL_BASED_OUTPATIENT_CLINIC_OR_DEPARTMENT_OTHER)
Admission: RE | Disposition: A | Discharge status: Home / Self Care | Payer: Self-pay | Source: Ambulatory Visit | Attending: Orthopedic Surgery

## 2011-03-08 DIAGNOSIS — M069 Rheumatoid arthritis, unspecified: Secondary | ICD-10-CM | POA: Insufficient documentation

## 2011-03-08 DIAGNOSIS — I10 Essential (primary) hypertension: Secondary | ICD-10-CM | POA: Insufficient documentation

## 2011-03-08 DIAGNOSIS — Z01812 Encounter for preprocedural laboratory examination: Secondary | ICD-10-CM | POA: Insufficient documentation

## 2011-03-08 DIAGNOSIS — J8410 Pulmonary fibrosis, unspecified: Secondary | ICD-10-CM | POA: Insufficient documentation

## 2011-03-08 HISTORY — DX: Essential (primary) hypertension: I10

## 2011-03-08 HISTORY — DX: Gastro-esophageal reflux disease without esophagitis: K21.9

## 2011-03-08 HISTORY — PX: MASS EXCISION: SHX2000

## 2011-03-08 SURGERY — EXCISION MASS
Anesthesia: Monitor Anesthesia Care | Site: Arm Lower | Laterality: Right | Wound class: Clean

## 2011-03-08 MED ORDER — METOCLOPRAMIDE HCL 5 MG/ML IJ SOLN: 10.0000 mg | Freq: Once | INTRAMUSCULAR | Status: DC | PRN

## 2011-03-08 MED ORDER — MIDAZOLAM HCL 5 MG/5ML IJ SOLN
INTRAMUSCULAR | Status: DC | PRN
  Administered 2011-03-08: 1 mg via INTRAVENOUS

## 2011-03-08 MED ORDER — CHLORHEXIDINE GLUCONATE 4 % EX LIQD: 60.0000 mL | Freq: Once | CUTANEOUS | Status: DC

## 2011-03-08 MED ORDER — LIDOCAINE HCL (CARDIAC) 20 MG/ML IV SOLN
INTRAVENOUS | Status: DC | PRN
  Administered 2011-03-08: 60 mg via INTRAVENOUS

## 2011-03-08 MED ORDER — LACTATED RINGERS IV SOLN
INTRAVENOUS | Status: DC
  Administered 2011-03-08: 09:00:00 via INTRAVENOUS

## 2011-03-08 MED ORDER — ONDANSETRON HCL 4 MG/2ML IJ SOLN
INTRAMUSCULAR | Status: DC | PRN
  Administered 2011-03-08: 4 mg via INTRAVENOUS

## 2011-03-08 MED ORDER — ACETAMINOPHEN 10 MG/ML IV SOLN
1000.0000 mg | Freq: Once | INTRAVENOUS | Status: AC
  Administered 2011-03-08: 1000 mg via INTRAVENOUS

## 2011-03-08 MED ORDER — MIDAZOLAM HCL 2 MG/2ML IJ SOLN: 0.5000 mg | INTRAMUSCULAR | Status: DC | PRN

## 2011-03-08 MED ORDER — MORPHINE SULFATE 2 MG/ML IJ SOLN: 0.0500 mg/kg | INTRAMUSCULAR | Status: DC | PRN

## 2011-03-08 MED ORDER — OXYCODONE-ACETAMINOPHEN 5-325 MG PO TABS: 1.0000 | ORAL_TABLET | ORAL | Status: AC | PRN

## 2011-03-08 MED ORDER — PROPOFOL 10 MG/ML IV EMUL
INTRAVENOUS | Status: DC | PRN
  Administered 2011-03-08: 25 ug/kg/min via INTRAVENOUS

## 2011-03-08 MED ORDER — CEFAZOLIN SODIUM 1-5 GM-% IV SOLN
1.0000 g | INTRAVENOUS | Status: AC
  Administered 2011-03-08: 2 g via INTRAVENOUS

## 2011-03-08 MED ORDER — FENTANYL CITRATE 0.05 MG/ML IJ SOLN: 25.0000 ug | INTRAMUSCULAR | Status: DC | PRN

## 2011-03-08 MED ORDER — FENTANYL CITRATE 0.05 MG/ML IJ SOLN
INTRAMUSCULAR | Status: DC | PRN
  Administered 2011-03-08: 50 ug via INTRAVENOUS

## 2011-03-08 MED ORDER — LIDOCAINE HCL (PF) 2 % IJ SOLN
INTRAMUSCULAR | Status: DC | PRN
  Administered 2011-03-08: 4.5 mL

## 2011-03-08 SURGICAL SUPPLY — 55 items
BANDAGE ACE 4 STERILE (GAUZE/BANDAGES/DRESSINGS) ×1 IMPLANT
BANDAGE ADHESIVE 1X3 (GAUZE/BANDAGES/DRESSINGS) IMPLANT
BANDAGE ELASTIC 3 VELCRO ST LF (GAUZE/BANDAGES/DRESSINGS) IMPLANT
BLADE MINI RND TIP GREEN BEAV (BLADE) IMPLANT
BLADE SURG 15 STRL LF DISP TIS (BLADE) ×1 IMPLANT
BLADE SURG 15 STRL SS (BLADE) ×2
BNDG CMPR 9X4 STRL LF SNTH (GAUZE/BANDAGES/DRESSINGS) ×1
BNDG CMPR MD 5X2 ELC HKLP STRL (GAUZE/BANDAGES/DRESSINGS)
BNDG COHESIVE 1X5 TAN STRL LF (GAUZE/BANDAGES/DRESSINGS) ×2 IMPLANT
BNDG ELASTIC 2 VLCR STRL LF (GAUZE/BANDAGES/DRESSINGS) IMPLANT
BNDG ESMARK 4X9 LF (GAUZE/BANDAGES/DRESSINGS) ×1 IMPLANT
BRUSH SCRUB EZ PLAIN DRY (MISCELLANEOUS) ×2 IMPLANT
CLOSURE STERI STRIP 1/2 X4 (GAUZE/BANDAGES/DRESSINGS) ×1 IMPLANT
CLOTH BEACON ORANGE TIMEOUT ST (SAFETY) ×2 IMPLANT
CORDS BIPOLAR (ELECTRODE) ×1 IMPLANT
COVER MAYO STAND STRL (DRAPES) ×2 IMPLANT
COVER TABLE BACK 60X90 (DRAPES) ×2 IMPLANT
CUFF TOURNIQUET SINGLE 18IN (TOURNIQUET CUFF) IMPLANT
DECANTER SPIKE VIAL GLASS SM (MISCELLANEOUS) IMPLANT
DRAIN PENROSE 1/2X12 LTX STRL (WOUND CARE) IMPLANT
DRAIN PENROSE 1/4X12 LTX STRL (WOUND CARE) IMPLANT
DRAPE EXTREMITY T 121X128X90 (DRAPE) ×2 IMPLANT
DRAPE SURG 17X23 STRL (DRAPES) ×2 IMPLANT
DRSG TEGADERM 4X4.75 (GAUZE/BANDAGES/DRESSINGS) ×1 IMPLANT
GAUZE XEROFORM 1X8 LF (GAUZE/BANDAGES/DRESSINGS) IMPLANT
GLOVE BIOGEL M 6.5 STRL (GLOVE) ×1 IMPLANT
GLOVE BIOGEL PI IND STRL 8 (GLOVE) ×1 IMPLANT
GLOVE BIOGEL PI INDICATOR 8 (GLOVE) ×1
GLOVE INDICATOR 7.0 STRL GRN (GLOVE) ×2 IMPLANT
GLOVE ORTHO TXT STRL SZ7.5 (GLOVE) ×2 IMPLANT
GOWN PREVENTION PLUS XLARGE (GOWN DISPOSABLE) ×2 IMPLANT
GOWN STRL REIN XL XLG (GOWN DISPOSABLE) ×4 IMPLANT
NDL BLUNT 17GA (NEEDLE) ×1 IMPLANT
NEEDLE 27GAX1X1/2 (NEEDLE) ×1 IMPLANT
NEEDLE BLUNT 17GA (NEEDLE) ×2 IMPLANT
PACK BASIN DAY SURGERY FS (CUSTOM PROCEDURE TRAY) ×2 IMPLANT
PAD CAST 3X4 CTTN HI CHSV (CAST SUPPLIES) IMPLANT
PADDING CAST COTTON 3X4 STRL (CAST SUPPLIES)
PADDING UNDERCAST 2  STERILE (CAST SUPPLIES) IMPLANT
SPLINT PLASTER CAST XFAST 3X15 (CAST SUPPLIES) IMPLANT
SPLINT PLASTER XTRA FASTSET 3X (CAST SUPPLIES)
SPONGE GAUZE 4X4 12PLY (GAUZE/BANDAGES/DRESSINGS) IMPLANT
STOCKINETTE 4X48 STRL (DRAPES) ×2 IMPLANT
STRIP CLOSURE SKIN 1/2X4 (GAUZE/BANDAGES/DRESSINGS) ×1 IMPLANT
SUT ETHILON 5 0 P 3 18 (SUTURE) ×1
SUT MERSILENE 4 0 P 3 (SUTURE) IMPLANT
SUT NYLON ETHILON 5-0 P-3 1X18 (SUTURE) ×1 IMPLANT
SUT PROLENE 3 0 PS 2 (SUTURE) ×1 IMPLANT
SYR 20CC LL (SYRINGE) ×2 IMPLANT
SYR 3ML 23GX1 SAFETY (SYRINGE) IMPLANT
SYR CONTROL 10ML LL (SYRINGE) ×1 IMPLANT
TOWEL OR 17X24 6PK STRL BLUE (TOWEL DISPOSABLE) ×2 IMPLANT
TRAY DSU PREP LF (CUSTOM PROCEDURE TRAY) ×2 IMPLANT
UNDERPAD 30X30 INCONTINENT (UNDERPADS AND DIAPERS) ×2 IMPLANT
WATER STERILE IRR 1000ML POUR (IV SOLUTION) IMPLANT

## 2011-03-08 NOTE — H&P (Signed)
    RE: [name]. Natal     Seen by: [name], [name]., MD   OFFICE VISIT   02-26-11    DOB: 10/28/1950  [name] presents with a mass on the dorsal surface of his proximal right ulna. This is consistent with a probable rheumatoid nodule. [name] has background rheumatoid arthritis and is under the care of Dr. Corliss Mccarthy. He is on HUMARA and Celebrex.   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 He had I&D of his bursa followed by biopsy of granulomatous bursitis with ultimate identification of atypical organisms on his pathologic report. We referred him for an ID consult.   He now requests that we remove the mass on his forearm. I have pointed out to him that more likely than not this represents a rheumatoid nodule. He is adamant that he would like to have it removed for a tissue diagnosis.  His past history is updated. He has no drug allergies. Current medications include HUMARA 40 mg daily, Celebrex 200 mg daily, Lisinopril 10 mg daily and Pantoprozole 20 mg daily. Prior surgery includes the aforementioned elbow surgery and neck surgery in 2004. His social history reveals he is single. He is a nonsmoker and does not use alcoholic beverages. His family history is detailed and noncontributory. A 14 system review of systems is detailed and positive for rheumatoid arthritis.   Physical exam reveals a pleasant 60 year old gentleman. The aforementioned mass is obvious at the proximal olecranon. He has signs of rheumatoid nodules with small nodules along the ulnar border of his left forearm.   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   Plan: I have advised [name] that I would be willing to comply and biopsy his mass for diagnosis and hopeful resolution. If this turns out to be a rheumatoid nodule, he will likely develop more over time. He understands  this. He may benefit from modifying his arthritis medication under Dr. Fatima Mccarthy supervision. We will schedule him for excisional biopsy in December 2012. Questions regarding the surgery were invited and answered in detail.    RVS/phe  T: 03-01-11  H&P documentation: 03/09/2011  -History and Physical Reviewed  -Patient has been re-examined  -No change in the plan of care  Riley Forster, MD

## 2011-03-08 NOTE — Anesthesia Preprocedure Evaluation (Addendum)
Anesthesia Evaluation  Patient identified by MRN, date of birth, ID band Patient awake    Reviewed: Allergy & Precautions, H&P , NPO status , Patient's Chart, lab work & pertinent test results, reviewed documented beta blocker date and time   Airway Mallampati: II TM Distance: >3 FB Neck ROM: full    Dental   Pulmonary shortness of breath and with exertion,          Cardiovascular Exercise Tolerance: Poor hypertension, On Medications     Neuro/Psych Negative Neurological ROS  Negative Psych ROS   GI/Hepatic negative GI ROS, Neg liver ROS, Medicated,  Endo/Other  Negative Endocrine ROS  Renal/GU negative Renal ROS  Genitourinary negative   Musculoskeletal   Abdominal   Peds  Hematology negative hematology ROS (+)   Anesthesia Other Findings See surgeon's H&P   Reproductive/Obstetrics negative OB ROS                          Anesthesia Physical Anesthesia Plan  ASA: III  Anesthesia Plan: MAC   Post-op Pain Management:    Induction: Intravenous  Airway Management Planned: Simple Face Mask  Additional Equipment:   Intra-op Plan:   Post-operative Plan:   Informed Consent: I have reviewed the patients History and Physical, chart, labs and discussed the procedure including the risks, benefits and alternatives for the proposed anesthesia with the patient or authorized representative who has indicated his/her understanding and acceptance.     Plan Discussed with: CRNA and Surgeon  Anesthesia Plan Comments:         Anesthesia Quick Evaluation

## 2011-03-08 NOTE — Op Note (Signed)
Op note dictated 03/08/11  [id number]

## 2011-03-08 NOTE — Op Note (Signed)
NAMETYSIN, SALADA               ACCOUNT NO.:  [id number]  MEDICAL RECORD NO.:  [id number]  LOCATION:                                 FACILITY:  PHYSICIAN:  Katy Fitch. Zafira Munos, M.D.      DATE OF BIRTH:  DATE OF PROCEDURE:  03/08/2011 DATE OF DISCHARGE:                              OPERATIVE REPORT   PREOPERATIVE DIAGNOSIS:  Large firm mass, dorsal surface of right proximal ulna/olecranon region rule out rheumatoid nodule.  POSTOPERATIVE DIAGNOSIS:  Probable granuloma annulare versus rheumatoid nodule.  OPERATION:  Excision of a 3.5 cm x 3.5 cm x 1.5 cm deep mass dorsal surface of right proximal ulna and olecranon.  OPERATING SURGEON:  Katy Fitch. Roseana Rhine, MD  ASSISTANT:  Annye Rusk, PA-C  ANESTHESIA:  IV sedation supplemented by 7 mL of 2% plain lidocaine.  SUPERVISING ANESTHESIOLOGIST:  Janetta Hora. Gelene Mink, MD  INDICATIONS:  [name] is a 60 year old gentleman who receives his primary care from Dr. Murray Hodgkins.  He has history of rheumatoid arthritis, pulmonary fibrosis, hypertension and other medical predicaments.  He presented requesting management of an enlarging mass on the dorsal surface of his right proximal forearm.  With his history of rheumatoid arthritis more likely than not this represents a rheumatoid nodule.  We advised Mr. Cassatt that it would be acceptable to observe this.  He stated that this was uncomfortable, bothersome, and unsightly and wanted it removed.  We agreed to proceed with removal for confirmation of diagnosis.  He understands that if this is granuloma annulare or a rheumatoid nodule that there is a significant chance of recurrence in the same region as these are hematogenous in origin.  After informed consent, he is brought to the operating room at this time.  Preoperatively, he was reminded of the potential risks and benefits of surgery.  Questions were invited and answered in detail.  Dr. Gelene Mink provided detailed  anesthesia informed consent.  In view of his history of pulmonary fibrosis, sedation and local anesthesia was recommended and accepted by Mr. Intriago.  PROCEDURE:  Geofrey Silliman was brought to room 1 of the [facility] and placed in supine position on the operating table.  Under Dr. Thornton Dales direct supervision, IV sedation was provided.  The right proximal olecranon region was then clipped, scrubbed, prepped with Betadine, and a total of 7 mL of 2% plain lidocaine infiltrated for regional/field block anesthesia including the periosteum of the olecranon.  After routine surgical time-out, the right arm was then exsanguinated with Esmarch bandage, and arterial tourniquet inflated to 250 mmHg due to mild systolic hypertension.  Procedure commenced with a 5.6 mm longitudinal incision.  This was taken through the dermis followed by identification of a yellowish gray mass adherent to the fascia of the flexor carpi ulnaris and extensor carpi ulnaris.  With some difficulty, the mass was separated from the fascia of the flexor carpi ulnaris, the periosteum of the ulna, and the fascia of the extensor carpi ulnaris.  There were several fingers of infiltration that were most notable on the volar surface of the forearm. These were removed with scissors dissection and a rongeur.  The entire mass was placed in formalin  and passed off the pathologic evaluation.  The wounds were inspected for bleeding points which were electrocauterized with bipolar current.  The wound was then closed in layers with subcutaneous 3-0 Vicryl and intradermal 3-0 Prolene segmental sutures.  The tourniquet was released and hemostasis was achieved by direct pressure.  Mr. Schafer wound was then dressed with Steri-Strips, sterile gauze, a Tegaderm and Ace wrap.  There were no apparent complications.  For aftercare, he is provided prescriptions for Percocet 5 mg 1 p.o. q.4-6 h. p.r.n. pain, 30 tablets without  refill.  He also was provided 1 g of Ancef as an IV prophylactic antibiotic.     Katy Fitch Shakenya Stoneberg, M.D.     RVS/MEDQ  D:  03/08/2011  T:  03/08/2011  Job:  [id number]  cc:   Lenon Curt. Chilton Si, M.D.

## 2011-03-08 NOTE — H&P (Signed)
[name] is an 60 y.o. male.   Chief Complaint: Complaining of mass aspect right ulna HPI: [name] presents with a mass on the dorsal surface of his proximal right ulna. This is consistent with a probable rheumatoid nodule. [name] has background rheumatoid arthritis and is under the care of Dr. Corliss Skains. He is on HUMARA and Celebrex.   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 He had I&D of his bursa followed by biopsy of granulomatous bursitis with ultimate identification of atypical organisms on his pathologic report. We referred him for an ID consult.   He now requests that we remove the mass on his forearm. I have pointed out to him that more likely than not this represents a rheumatoid nodule. He is adamant that he would like to have it removed for a tissue diagnosis.  Past Medical History  Diagnosis Date  . Rheumatoid arthritis 2005  . Pulmonary fibrosis   . Hypertension   . GERD (gastroesophageal reflux disease)     Past Surgical History  Procedure Date  . Excision mass r lateral arm 2010 2010    History reviewed. No pertinent family history. Social History:  reports that he quit smoking about 3 years ago. He does not have any smokeless tobacco history on file. He reports that he does not drink alcohol. His drug history not on file.  Allergies: No Known Allergies  Medications Prior to Admission  Medication Dose Route Frequency Provider Last Rate Last Dose  . ceFAZolin (ANCEF) IVPB 1 g/50 mL premix  1 g Intravenous 60 min Pre-Op       . chlorhexidine (HIBICLENS) 4 % liquid 4 application  60 mL Topical Once        Medications Prior to Admission  Medication Sig Dispense Refill  . Adalimumab (HUMIRA) 20 MG/0.4ML KIT As directed every 2 wks      . celecoxib (CELEBREX) 200 MG capsule Take 200 mg by mouth 2 (two) times daily.        Marland Kitchen lisinopril (PRINIVIL,ZESTRIL) 10 MG tablet Take 10 mg by mouth daily.         . pantoprazole (PROTONIX) 20 MG tablet Take 20 mg by mouth daily.          No results found for this or any previous visit (from the past 48 hour(s)).  No results found.   Pertinent items are noted in HPI.  Blood pressure 152/111, pulse 86, temperature 97.7 F (36.5 C), temperature source Oral, resp. rate 20, height 5\' 9"  (1.753 m), weight 95.255 kg (210 lb).  General appearance: alert Head: Normocephalic, without obvious abnormality Neck: supple, symmetrical, trachea midline Resp: decreased breath sounds throughout all lung fields Cardio: regular rate and rhythm, S1, S2 normal, no murmur, click, rub or gallop GI: normal findings: bowel sounds normal Extremities:aforementioned mass is obvious at the proximal olecranon. He has signs of rheumatoid nodules with small nodules along the ulnar border forearm.   Pulses: 2+ and symmetric Skin: normal Neurologic: Grossly normal    Assessment/Plan Impression rheumatoid nodule at proximal aspect of right ulna  Plan: Patient to be taken to the operating room to undergo excisional biopsy of mass of the right proximal ulna . The procedure risks and postoperative course were discussed with the patient and he was in agreement with this plan.  DASNOIT,[name] 03/08/2011, 9:08 AM    H&P documentation: 03/08/2011  -History and Physical Reviewed  -Patient has been re-examined  -  No change in the plan of care  Wyn Forster, MD

## 2011-03-08 NOTE — Brief Op Note (Signed)
03/08/2011  10:40 AM  PATIENT:  Oda Kilts  60 y.o. male  PRE-OPERATIVE DIAGNOSIS:  mass right arm dorsal surface of olecranon  POST-OPERATIVE DIAGNOSIS:   probable rheumatoid nodule  PROCEDURE:  Procedure(s): EXCISION MASS 3.5cm x 3.5 cm proximal dorsal surface of olecranon  SURGEON:  Surgeon(s): Wyn Forster., MD  PHYSICIAN ASSISTANT:   ASSISTANTS: Mallory Shirk.A-C   ANESTHESIA:   IV sedation  EBL:  Total I/O In: 100 [I.V.:100] Out: -   BLOOD ADMINISTERED:none  DRAINS: none   LOCAL MEDICATIONS USED:  LIDOCAINE 7 CC  SPECIMEN:  Excision  DISPOSITION OF SPECIMEN:  PATHOLOGY  COUNTS:  YES  TOURNIQUET:   Total Tourniquet Time Documented: Upper Arm (Right) - 20 minutes  DICTATION: .Other Dictation: Dictation Number [id number]  PLAN OF CARE: Discharge to home after PACU  PATIENT DISPOSITION:  PACU - hemodynamically stable.

## 2011-03-08 NOTE — Anesthesia Postprocedure Evaluation (Signed)
  Anesthesia Post Note  Patient: [name]  Procedure(s) Performed:  EXCISION MASS - excision rheumatoid nodule right ulna  Anesthesia type: General  Patient location: PACU  Post pain: Pain level controlled  Post assessment: Patient's Cardiovascular Status Stable  Last Vitals:  Filed Vitals:   03/08/11 1135  BP: 161/94  Pulse: 78  Temp: 36.4 C  Resp: 22    Post vital signs: Reviewed and stable  Level of consciousness: alert  Complications: No apparent anesthesia complications

## 2011-03-08 NOTE — Transfer of Care (Signed)
Immediate Anesthesia Transfer of Care Note  Patient: [name]  Procedure(s) Performed:  EXCISION MASS - excision rheumatoid nodule right ulna  Patient Location: PACU  Anesthesia Type: MAC  Level of Consciousness: awake, alert  and patient cooperative  Airway & Oxygen Therapy: Patient Spontanous Breathing and Patient connected to face mask oxygen  Post-op Assessment: Report given to PACU RN, Post -op Vital signs reviewed and stable and Patient moving all extremities  Post vital signs: Reviewed and stable  Complications: No apparent anesthesia complications

## 2011-03-09 ENCOUNTER — Encounter (HOSPITAL_BASED_OUTPATIENT_CLINIC_OR_DEPARTMENT_OTHER): Payer: Self-pay | Admitting: Orthopedic Surgery

## 2011-04-26 ENCOUNTER — Other Ambulatory Visit: Payer: Self-pay | Admitting: Family Medicine

## 2011-04-26 MED ORDER — LISINOPRIL 10 MG PO TABS: 10.0000 mg | ORAL_TABLET | Freq: Every day | ORAL | Status: DC

## 2011-12-03 ENCOUNTER — Other Ambulatory Visit: Payer: Self-pay | Admitting: Physician Assistant

## 2011-12-03 NOTE — Telephone Encounter (Signed)
Chart pulled to PA pool at nurse station [phone]

## 2012-04-11 ENCOUNTER — Other Ambulatory Visit: Payer: Self-pay | Admitting: Family Medicine

## 2012-06-02 ENCOUNTER — Ambulatory Visit (INDEPENDENT_AMBULATORY_CARE_PROVIDER_SITE_OTHER): Payer: BC Managed Care – PPO | Admitting: Emergency Medicine

## 2012-06-02 ENCOUNTER — Ambulatory Visit: Payer: BC Managed Care – PPO

## 2012-06-02 VITALS — BP 130/78 | HR 104 | Temp 98.2°F | Resp 20 | Ht 69.5 in | Wt 182.4 lb

## 2012-06-02 DIAGNOSIS — J8410 Pulmonary fibrosis, unspecified: Secondary | ICD-10-CM

## 2012-06-02 MED ORDER — CLARITHROMYCIN ER 500 MG PO TB24: 1000.0000 mg | ORAL_TABLET | Freq: Every day | ORAL | Status: DC

## 2012-06-02 MED ORDER — IPRATROPIUM BROMIDE 0.02 % IN SOLN
0.5000 mg | Freq: Once | RESPIRATORY_TRACT | Status: AC
  Administered 2012-06-02: 0.5 mg via RESPIRATORY_TRACT

## 2012-06-02 MED ORDER — LISINOPRIL 10 MG PO TABS: 10.0000 mg | ORAL_TABLET | Freq: Every day | ORAL | Status: DC

## 2012-06-02 MED ORDER — ALBUTEROL SULFATE (2.5 MG/3ML) 0.083% IN NEBU: 2.5000 mg | INHALATION_SOLUTION | Freq: Four times a day (QID) | RESPIRATORY_TRACT | Status: DC | PRN

## 2012-06-02 MED ORDER — ALBUTEROL SULFATE (2.5 MG/3ML) 0.083% IN NEBU
2.5000 mg | INHALATION_SOLUTION | Freq: Once | RESPIRATORY_TRACT | Status: AC
  Administered 2012-06-02: 2.5 mg via RESPIRATORY_TRACT

## 2012-06-02 NOTE — Progress Notes (Addendum)
[facility] [address] [place] [address] [phone]  Date:  06/02/2012   Name:  [name]   DOB:  06-Apr-1950   MRN:  [id number]  PCP:  No primary provider on file.    Chief Complaint: Cough and Headache   History of Present Illness:  [name] is a 62 y.o. very pleasant male patient who presents with the following:  1 week history of cough productive purulent sputum, scant.  Has a mucoid nasal discharge.  Exertional shortness of breath. No wheezing.  No hemoptysis.  No fever or chills.  No chest pain, nausea or vomiting. Feels fatigued.  Denies other complaint or health concern today. No improvement with over the counter medications or other home remedies.   Patient Active Problem List  Diagnosis  . PULMONARY FIBROSIS ILD POST INFLAMMATORY CHRONIC  . COUGH    Past Medical History  Diagnosis Date  . Rheumatoid arthritis 2005  . Pulmonary fibrosis   . Hypertension   . GERD (gastroesophageal reflux disease)     Past Surgical History  Procedure Laterality Date  . Excision mass r lateral arm 2010  2010  . Mass excision  03/08/2011    Procedure: EXCISION MASS;  Surgeon: Wyn Forster., MD;  Location: [facility];  Service: Orthopedics;  Laterality: Right;  excision rheumatoid nodule right ulna    History  Substance Use Topics  . Smoking status: Former Smoker -- 0.50 packs/day for 30 years    Quit date: 01/25/2008  . Smokeless tobacco: Not on file  . Alcohol Use: No    No family history on file.  No Known Allergies  Medication list has been reviewed and updated.  Current Outpatient Prescriptions on File Prior to Visit  Medication Sig Dispense Refill  . Adalimumab (HUMIRA) 20 MG/0.4ML KIT As directed every 2 wks      . celecoxib (CELEBREX) 200 MG capsule Take 200 mg by mouth 2 (two) times daily.        Marland Kitchen lisinopril (PRINIVIL,ZESTRIL) 10 MG tablet TAKE 1 TABLET EVERY DAY  30 tablet  0   No current  facility-administered medications on file prior to visit.    Review of Systems:  As per HPI, otherwise negative.    Physical Examination: Filed Vitals:   06/02/12 0940  BP: 130/78  Pulse: 104  Temp: 98.2 F (36.8 C)  Resp: 20   Filed Vitals:   06/02/12 0940  Height: 5' 9.5" (1.765 m)  Weight: 182 lb 6.4 oz (82.736 kg)   Body mass index is 26.56 kg/(m^2). Ideal Body Weight: Weight in (lb) to have BMI = 25: 171.4  GEN: WDWN, NAD, Non-toxic, A & O x 3 HEENT: Atraumatic, Normocephalic. Neck supple. No masses, No LAD. Ears and Nose: No external deformity. CV: RRR, No M/G/R. No JVD. No thrill. No extra heart sounds. PULM: , bilateral wheezes and crackles, no rhonchi. No retractions. No resp. distress. No accessory muscle use. ABD: S, NT, ND, +BS. No rebound. No HSM. EXTR: No c/c/e NEURO Normal gait.  PSYCH: Normally interactive. Conversant. Not depressed or anxious appearing.  Calm demeanor.    Assessment and Plan: Acute exacerbation chronic bronchitis Pulmonary fibrosis biaxin albuterol  Carmelina Dane, MD  [facility] reading (PRIMARY) by  Dr. Dareen Piano.  Pulmonary fibrosis.

## 2012-06-02 NOTE — Patient Instructions (Addendum)

## 2012-11-26 ENCOUNTER — Other Ambulatory Visit: Payer: Self-pay | Admitting: Emergency Medicine

## 2012-12-05 ENCOUNTER — Encounter: Payer: Self-pay | Admitting: *Deleted

## 2012-12-28 ENCOUNTER — Other Ambulatory Visit: Payer: Self-pay | Admitting: Physician Assistant

## 2013-01-10 ENCOUNTER — Other Ambulatory Visit: Payer: Self-pay | Admitting: Physician Assistant

## 2013-01-21 ENCOUNTER — Encounter (HOSPITAL_COMMUNITY): Payer: Self-pay | Admitting: Emergency Medicine

## 2013-01-21 ENCOUNTER — Emergency Department (HOSPITAL_COMMUNITY): Payer: BC Managed Care – PPO

## 2013-01-21 ENCOUNTER — Ambulatory Visit (INDEPENDENT_AMBULATORY_CARE_PROVIDER_SITE_OTHER): Payer: BC Managed Care – PPO | Admitting: Emergency Medicine

## 2013-01-21 ENCOUNTER — Emergency Department (HOSPITAL_COMMUNITY)
Admission: EM | Admit: 2013-01-21 | Discharge: 2013-01-21 | Disposition: A | Discharge status: Home / Self Care | Payer: BC Managed Care – PPO | Attending: Emergency Medicine | Admitting: Emergency Medicine

## 2013-01-21 ENCOUNTER — Encounter: Payer: Self-pay | Admitting: Emergency Medicine

## 2013-01-21 VITALS — BP 80/62 | HR 124 | Temp 97.3°F | Resp 18 | Ht 68.5 in | Wt 187.0 lb

## 2013-01-21 DIAGNOSIS — Z8719 Personal history of other diseases of the digestive system: Secondary | ICD-10-CM | POA: Insufficient documentation

## 2013-01-21 DIAGNOSIS — Z8709 Personal history of other diseases of the respiratory system: Secondary | ICD-10-CM | POA: Insufficient documentation

## 2013-01-21 DIAGNOSIS — I959 Hypotension, unspecified: Secondary | ICD-10-CM

## 2013-01-21 DIAGNOSIS — R000 Tachycardia, unspecified: Secondary | ICD-10-CM

## 2013-01-21 DIAGNOSIS — R05 Cough: Secondary | ICD-10-CM | POA: Insufficient documentation

## 2013-01-21 DIAGNOSIS — J8410 Pulmonary fibrosis, unspecified: Secondary | ICD-10-CM

## 2013-01-21 DIAGNOSIS — Z791 Long term (current) use of non-steroidal anti-inflammatories (NSAID): Secondary | ICD-10-CM | POA: Insufficient documentation

## 2013-01-21 DIAGNOSIS — M069 Rheumatoid arthritis, unspecified: Secondary | ICD-10-CM | POA: Insufficient documentation

## 2013-01-21 DIAGNOSIS — Z792 Long term (current) use of antibiotics: Secondary | ICD-10-CM | POA: Insufficient documentation

## 2013-01-21 DIAGNOSIS — Z87891 Personal history of nicotine dependence: Secondary | ICD-10-CM | POA: Insufficient documentation

## 2013-01-21 DIAGNOSIS — R059 Cough, unspecified: Secondary | ICD-10-CM | POA: Insufficient documentation

## 2013-01-21 DIAGNOSIS — I951 Orthostatic hypotension: Secondary | ICD-10-CM

## 2013-01-21 DIAGNOSIS — Z79899 Other long term (current) drug therapy: Secondary | ICD-10-CM | POA: Insufficient documentation

## 2013-01-21 DIAGNOSIS — E119 Type 2 diabetes mellitus without complications: Secondary | ICD-10-CM

## 2013-01-21 LAB — CBC WITH DIFFERENTIAL/PLATELET
Basophils Absolute: 0.1 10*3/uL (ref 0.0–0.1)
Basophils Relative: 1 % (ref 0–1)
Eosinophils Absolute: 0.2 10*3/uL (ref 0.0–0.7)
Eosinophils Relative: 2 % (ref 0–5)
HCT: 41.3 % (ref 39.0–52.0)
Hemoglobin: 14.5 g/dL (ref 13.0–17.0)
Lymphocytes Relative: 27 % (ref 12–46)
Lymphs Abs: 2.9 10*3/uL (ref 0.7–4.0)
MCH: 31.7 pg (ref 26.0–34.0)
MCHC: 35.1 g/dL (ref 30.0–36.0)
MCV: 90.2 fL (ref 78.0–100.0)
Monocytes Absolute: 0.6 10*3/uL (ref 0.1–1.0)
Monocytes Relative: 6 % (ref 3–12)
Platelets: 264 10*3/uL (ref 150–400)
RBC: 4.58 MIL/uL (ref 4.22–5.81)
RDW: 12.5 % (ref 11.5–15.5)
WBC: 11 10*3/uL — ABNORMAL HIGH (ref 4.0–10.5)

## 2013-01-21 LAB — BASIC METABOLIC PANEL
BUN: 15 mg/dL (ref 6–23)
Calcium: 9.4 mg/dL (ref 8.4–10.5)
Chloride: 96 mEq/L (ref 96–112)
Creatinine, Ser: 0.78 mg/dL (ref 0.50–1.35)
GFR calc Af Amer: >90 mL/min (ref >90)
Potassium: 3.8 mEq/L (ref 3.5–5.1)

## 2013-01-21 LAB — TROPONIN I: Troponin I: <0.3 ng/mL (ref <0.30)

## 2013-01-21 MED ORDER — METFORMIN HCL 500 MG PO TABS: 500.0000 mg | ORAL_TABLET | Freq: Two times a day (BID) | ORAL | Status: DC

## 2013-01-21 MED ORDER — SODIUM CHLORIDE 0.9 % IV BOLUS (SEPSIS)
1000.0000 mL | Freq: Once | INTRAVENOUS | Status: AC
  Administered 2013-01-21: 1000 mL via INTRAVENOUS

## 2013-01-21 NOTE — ED Notes (Signed)
Bed: WA17 Expected date:  Expected time:  Means of arrival:  Comments: ems 

## 2013-01-21 NOTE — Progress Notes (Signed)
Subjective:    Patient ID: [name], male    DOB: 05/19/1950, 62 y.o.   MRN: [id number]  HPI This chart was scribed for Viviann Spare Joniel Graumann-MD, by Ladona Ridgel Day, Scribe. This patient was seen in room 14 and the patient's care was started at 2:24 PM.   HPI Comments:  [name] is a 62 y.o. male who presents to the [facility] for a checkup on his blood pressure and refill on his BP medicine. He reports no recent illnesses but states he does have a mild cough. He took his blood pressure medicine today. He states feels hydrated well and reports that he usually is somewhat tachycardic. He has been taking his blood pressure medicine for about 6 months.   He usually sees Dr. Chilton Si  Medical Hx of pulmonary fibrosis and RA  Currently on lisinopril, celebrex, humira   Past Medical History  Diagnosis Date  . Rheumatoid arthritis(714.0) 2005  . Pulmonary fibrosis   . Hypertension   . GERD (gastroesophageal reflux disease)     Past Surgical History  Procedure Laterality Date  . Excision mass r lateral arm 2010  2010  . Mass excision  03/08/2011    Procedure: EXCISION MASS;  Surgeon: Wyn Forster., MD;  Location: [facility];  Service: Orthopedics;  Laterality: Right;  excision rheumatoid nodule right ulna    History reviewed. No pertinent family history.  History   Social History  . Marital Status: Single    Spouse Name: N/A    Number of Children: N/A  . Years of Education: N/A   Occupational History  . 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 Smoking status: Former Smoker -- 0.50 packs/day for 30 years    Quit date: 01/25/2008  . Smokeless tobacco: Not on file  . Alcohol Use: No  . Drug Use: Not on file  . Sexual Activity: Not on file   Other Topics Concern  . Not on file   Social History Narrative  . No narrative on file    No Known Allergies  Patient Active Problem List   Diagnosis Date Noted  . COUGH 10/10/2009   . PULMONARY FIBROSIS ILD POST INFLAMMATORY CHRONIC 12/30/2007    Results for orders placed during the hospital encounter of 03/08/11  GLUCOSE, CAPILLARY      Result Value Range   Glucose-Capillary 121 (*) 70 - 99 mg/dL  POCT HEMOGLOBIN-HEMACUE      Result Value Range   Hemoglobin 13.0  13.0 - 17.0 g/dL    1. Tachycardia     No orders of the defined types were placed in this encounter.    BP 80/62  Pulse 124  Temp(Src) 97.3 F (36.3 C) (Oral)  Resp 18  Ht 5' 8.5" (1.74 m)  Wt 187 lb (84.823 kg)  BMI 28.02 kg/m2  SpO2 90%        Review of Systems     Objective:   Physical Exam  Nursing note and vitals reviewed. Constitutional: He is oriented to person, place, and time. He appears well-developed and well-nourished. No distress.  HENT:  Head: Normocephalic and atraumatic.  Eyes: EOM are normal.  Neck: Neck supple. No tracheal deviation present.  Musculoskeletal: Normal range of motion.  Neurological: He is alert and oriented to person, place, and time.  Skin: Skin is warm and dry.  Psychiatric: He has a normal mood and affect. His behavior is normal.   Chest exam reveals  dry rales are present in the apices bilaterally. These dry rales are symmetrical. Breath sounds are symmetrical. His cardiac exam reveals no gallop but there is a significant tachycardia.  EKG there T wave changes laterally there is no sign of LVH on this tracing.     Assessment & Plan:   patient presents with evaluation for refill of his blood pressure medications. On arrival he was tachycardic. He has a mild hypoxia consistent with his pulmonary fibrosis. On orthostatic testing he is definitely orthostatic with a drop in blood pressure to a systolic pressure of 80 . His EKG shows some lateral T changes. He denies chest pain or shortness of breath he did have a near syncopal episode about a month ago but denies any recent episodes

## 2013-01-21 NOTE — ED Notes (Signed)
Patient transported to X-ray 

## 2013-01-21 NOTE — ED Provider Notes (Signed)
CSN: [id number]     Arrival date & time 01/21/13  1531 History   First MD Initiated Contact with Patient 01/21/13 1552     Chief Complaint  Patient presents with  . Tachycardia  . Hypotension    HPI  [name] is a 62 y.o. male with a PMH of RA, pulmonary fibrosis, HTN, and GERD who presents to the ED for evaluation of tachycardia and hypotention.  History was provided by the patient.  Patient states he was at the [facility] center today for a refill of his lisinopril.  He states he has been out of his BP medications for the past 6 months.  He was found to be tachycardic and hypotensive on exam and sent to the ED.  He denies any symptoms currently.  He denies any SOB or chest pain.  He has a chronic everyday non-productive cough.  He uses an inhaler as needed with no increase in use over the past few days.  He has pulmonary fibrosis from his RA.  He take Humira.  He is a former smoker.  He otherwise has been well with no fevers, chills, change in appetite/activity, congestion, abdominal pain, nausea, emesis, diarrhea, constipation, dysuria, leg edema, calf tenderness/pain.  He denies any hx of cardiac disease or clotting/DVT/PE.     Past Medical History  Diagnosis Date  . Rheumatoid arthritis(714.0) 2005  . Pulmonary fibrosis   . Hypertension   . GERD (gastroesophageal reflux disease)    Past Surgical History  Procedure Laterality Date  . Excision mass r lateral arm 2010  2010  . Mass excision  03/08/2011    Procedure: EXCISION MASS;  Surgeon: Wyn Forster., MD;  Location: [facility];  Service: Orthopedics;  Laterality: Right;  excision rheumatoid nodule right ulna   History reviewed. No pertinent family history. History  Substance Use Topics  . Smoking status: Former Smoker -- 0.50 packs/day for 30 years    Quit date: 01/25/2008  . Smokeless tobacco: Not on file  . Alcohol Use: No    Review of Systems  Constitutional: Negative for fever, chills, activity  change, appetite change and fatigue.  HENT: Negative for congestion, ear pain, rhinorrhea and sore throat.   Eyes: Negative for visual disturbance.  Respiratory: Negative for cough, shortness of breath and wheezing.   Cardiovascular: Negative for chest pain, palpitations and leg swelling.  Gastrointestinal: Negative for nausea, vomiting, abdominal pain, diarrhea and constipation.  Genitourinary: Negative for dysuria.  Musculoskeletal: Negative for back pain, gait problem, myalgias and neck pain.  Skin: Negative for color change, rash and wound.  Neurological: Negative for dizziness, weakness, light-headedness, numbness and headaches.    Allergies  Review of patient's allergies indicates no known allergies.  Home Medications   Current Outpatient Rx  Name  Route  Sig  Dispense  Refill  . Adalimumab (HUMIRA) 20 MG/0.4ML KIT      As directed every 2 wks         . albuterol (PROVENTIL) (2.5 MG/3ML) 0.083% nebulizer solution   Nebulization   Take 3 mLs (2.5 mg total) by nebulization every 6 (six) hours as needed for wheezing.   150 mL   1   . celecoxib (CELEBREX) 200 MG capsule   Oral   Take 200 mg by mouth 2 (two) times daily.           . clarithromycin (BIAXIN XL) 500 MG 24 hr tablet   Oral   Take 2 tablets (1,000 mg total)  by mouth daily.   20 tablet   0   . lisinopril (PRINIVIL,ZESTRIL) 10 MG tablet      TAKE 1 TABLET (10 MG TOTAL) BY MOUTH DAILY. NEEDS OFFICE VISIT!!   7 tablet   0    BP 128/88  Pulse 113  Temp(Src) 97.6 F (36.4 C) (Oral)  Resp 27  SpO2 98%  Filed Vitals:   01/21/13 1638 01/21/13 1640 01/21/13 1700 01/21/13 1800  BP: 116/72 101/66 103/68 105/71  Pulse: 106 113 88   Temp:      TempSrc:      Resp:   18 18  SpO2:   92% 94%     Physical Exam  Nursing note and vitals reviewed. Constitutional: He is oriented to person, place, and time. He appears well-developed and well-nourished. No distress.  HENT:  Head: Normocephalic and  atraumatic.  Right Ear: External ear normal.  Left Ear: External ear normal.  Nose: Nose normal.  Mouth/Throat: Oropharynx is clear and moist. No oropharyngeal exudate.  Eyes: Conjunctivae are normal. Pupils are equal, round, and reactive to light. Right eye exhibits no discharge. Left eye exhibits no discharge.  Neck: Normal range of motion. Neck supple.  Cardiovascular: Normal rate, regular rhythm, normal heart sounds and intact distal pulses.  Exam reveals no gallop and no friction rub.   No murmur heard. Dorsalis pedis pulses present bilaterally  Pulmonary/Chest: No respiratory distress. He has no wheezes. He has no rales. He exhibits no tenderness.  Coarse breath sounds throughout.  No wheezing.  Mild tachypnea.    Abdominal: Soft. Bowel sounds are normal. He exhibits no distension and no mass. There is no tenderness. There is no rebound and no guarding.  Musculoskeletal: Normal range of motion. He exhibits no edema and no tenderness.  No pedal edema or calf tenderness bilaterally  Neurological: He is alert and oriented to person, place, and time.  Skin: Skin is warm and dry. He is not diaphoretic.    ED Course  Procedures (including critical care time) Labs Review Labs Reviewed - No data to display Imaging Review No results found.  EKG Interpretation     Ventricular Rate:  110 PR Interval:  172 QRS Duration: 78 QT Interval:  312 QTC Calculation: 422 R Axis:   59 Text Interpretation:  Sinus tachycardia Probable left atrial enlargement Nonspecific T abnormalities, diffuse leads           Results for orders placed during the hospital encounter of 01/21/13  CBC WITH DIFFERENTIAL      Result Value Range   WBC 11.0 (*) 4.0 - 10.5 K/uL   RBC 4.58  4.22 - 5.81 MIL/uL   Hemoglobin 14.5  13.0 - 17.0 g/dL   HCT 16.1  09.6 - 04.5 %   MCV 90.2  78.0 - 100.0 fL   MCH 31.7  26.0 - 34.0 pg   MCHC 35.1  30.0 - 36.0 g/dL   RDW 40.9  81.1 - 91.4 %   Platelets 264  150 - 400  K/uL   Neutrophils Relative % 65  43 - 77 %   Neutro Abs 7.1  1.7 - 7.7 K/uL   Lymphocytes Relative 27  12 - 46 %   Lymphs Abs 2.9  0.7 - 4.0 K/uL   Monocytes Relative 6  3 - 12 %   Monocytes Absolute 0.6  0.1 - 1.0 K/uL   Eosinophils Relative 2  0 - 5 %   Eosinophils Absolute 0.2  0.0 - 0.7 K/uL  Basophils Relative 1  0 - 1 %   Basophils Absolute 0.1  0.0 - 0.1 K/uL  BASIC METABOLIC PANEL      Result Value Range   Sodium 130 (*) 135 - 145 mEq/L   Potassium 3.8  3.5 - 5.1 mEq/L   Chloride 96  96 - 112 mEq/L   CO2 23  19 - 32 mEq/L   Glucose, Bld 372 (*) 70 - 99 mg/dL   BUN 15  6 - 23 mg/dL   Creatinine, Ser 1.61  0.50 - 1.35 mg/dL   Calcium 9.4  8.4 - 09.6 mg/dL   GFR calc non Af Amer >90  >90 mL/min   GFR calc Af Amer >90  >90 mL/min  TROPONIN I      Result Value Range   Troponin I <0.30  <0.30 ng/mL  LACTIC ACID, PLASMA      Result Value Range   Lactic Acid, Venous 2.0  0.5 - 2.2 mmol/L      DG Chest 2 View (Final result)  Result time: 01/21/13 16:58:03    Final result by Rad Results In Interface (01/21/13 16:58:03)    Narrative:   CLINICAL DATA: Hypertension, shortness of breath. Pulmonary fibrosis.  EXAM: CHEST 2 VIEW  COMPARISON: 06/02/2012  FINDINGS: Severe chronic interstitial lung disease/fibrosis. No definite acute superimposed process. Heart is normal size. No effusions or acute bony abnormality.  IMPRESSION: Severe chronic lung disease/ fibrosis. No definite acute process.   Electronically Signed By: Charlett Nose M.D. On: 01/21/2013 16:58    MDM   1. Tachycardia   2. Orthostatic hypotension   3. Diabetes mellitus, new onset     [name] is a 62 y.o. male with a PMH of RA, pulmonary fibrosis, HTN, and GERD who presents to the ED for evaluation of tachycardia and hypotention.  Chest x-ray, lactic acid, troponin, CBC, BMP, and orthostatic vitals ordered.  1L normal saline will be given.     Rechecks  5:48 PM = Patient resting  comfortably.  Asymptomatic.    6:20 PM = Spoke with patient about discharge and new onset diabetes.  Went over diet, new medication, and answered questions about DM diagnosis.     Etiology of tachycardia possibly due to dehydration.  Patient's HR improved after a normal saline bolus in the ED.  His orthostatic vital signs were positive.  Previous records were reviewed which shows that he has had tachycardia in the past.  Patient had no complaints or pain.  His EKG was negative for acute ischemic changes, chest x-ray negative for an acute cardiopulmonary process, and troponin was negative.  His chest x-ray shows severe chronic lung disease which is likely due to his pulmonary fibrosis.  His labs revealed a glucose of 372 with no anion gap (11).  Patient likely has DM.  He was started on metformin and instructed to follow-up with a PCP as soon as possible. BP medications not restarted as he is hypotensive and normotensive.  Patient instructed to follow-up with PCP regarding this as well.  Patient was instructed to return to the ED if he has any syncope, dizziness, lightheadedness, chest pain, shortness of breath, fever, difficulty breathing, weakness, or other concerns.  Patient in agreement with discharge and plan.     Final impressions: 1. Tachycardia  2. Orthostatic hypotension 3. DM, new onset     Luiz Iron PA-C   This patient was discussed with Dr. Clayborne Dana, PA-C  01/23/13 0922 

## 2013-01-21 NOTE — Progress Notes (Deleted)
Subjective:    Patient ID: [name], male    DOB: 02/06/1951, 62 y.o.   MRN: [id number]  HPI This chart was scribed for Viviann Spare Daub-MD, by Ladona Ridgel Madie Cahn, Scribe. This patient was seen in room 14 and the patient's care was started at 2:24 PM.  HPI Comments: [name] is a 62 y.o. male who presents to the [facility] for a checkup on his blood pressure and refill on his BP medicine. He reports no recent illnesses but states he does have a mild cough. He took his blood pressure medicine today. He states feels hydrated well and reports that he usually is somewhat tachycardic. He has been taking his blood pressure medicine for about 6 months.   He usually sees Dr. [facility] Hx of pulmonary fibrosis and RA Currently on lisinopril, celebrex, humira  Past Medical History  Diagnosis Date  . Rheumatoid arthritis(714.0) 2005  . Pulmonary fibrosis   . Hypertension   . GERD (gastroesophageal reflux disease)     Past Surgical History  Procedure Laterality Date  . Excision mass r lateral arm 2010  2010  . Mass excision  03/08/2011    Procedure: EXCISION MASS;  Surgeon: Wyn Forster., MD;  Location: [facility];  Service: Orthopedics;  Laterality: Right;  excision rheumatoid nodule right ulna    History reviewed. No pertinent family history.  History   Social History  . Marital Status: Single    Spouse Name: N/A    Number of Children: N/A  . Years of Education: N/A   Occupational History  . 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 Smoking status: Former Smoker -- 0.50 packs/Gloriann Riede for 30 years    Quit date: 01/25/2008  . Smokeless tobacco: Not on file  . Alcohol Use: No  . Drug Use: Not on file  . Sexual Activity: Not on file   Other Topics Concern  . Not on file   Social History Narrative  . No narrative on file    No Known Allergies  Patient Active Problem List   Diagnosis Date Noted  . COUGH 10/10/2009  .  PULMONARY FIBROSIS ILD POST INFLAMMATORY CHRONIC 12/30/2007    Results for orders placed during the hospital encounter of 03/08/11  GLUCOSE, CAPILLARY      Result Value Range   Glucose-Capillary 121 (*) 70 - 99 mg/dL  POCT HEMOGLOBIN-HEMACUE      Result Value Range   Hemoglobin 13.0  13.0 - 17.0 g/dL    No diagnosis found.  No orders of the defined types were placed in this encounter.      Review of Systems  Triage Vitals: BP 110/62  Pulse 123  Temp(Src) 97.3 F (36.3 C) (Oral)  Resp 18  Ht 5' 8.5" (1.74 m)  Wt 187 lb (84.823 kg)  BMI 28.02 kg/m2  SpO2 90%     Objective:   Physical Exam  Nursing note and vitals reviewed. Constitutional: He is oriented to person, place, and time. He appears well-developed and well-nourished. No distress.  HENT:  Head: Normocephalic and atraumatic.  Eyes: EOM are normal.  Neck: Neck supple. No tracheal deviation present.  Cardiovascular: Normal rate.   Pulmonary/Chest: Effort normal. No respiratory distress.  Musculoskeletal: Normal range of motion.  Neurological: He is alert and oriented to person, place, and time.  Skin: Skin is warm and dry.  Psychiatric: He has a normal mood and affect. His behavior is normal.  Assessment & Plan:

## 2013-01-21 NOTE — ED Notes (Addendum)
Pt sent here form [facility] via [facility].  Pt seen at [facility] for a refill on his bp meds (lisinopril) .Pt himself has no complaints. According to [facility] they found tachycardia and orthostatic hypotension. PT is alert and oriented and NAD noted. Breaths sounds clear

## 2013-01-22 ENCOUNTER — Telehealth: Payer: Self-pay

## 2013-01-22 NOTE — Telephone Encounter (Signed)
Opened in error/rcoe

## 2013-01-23 NOTE — ED Provider Notes (Signed)
Medical screening examination/treatment/procedure(s) were performed by non-physician practitioner and as supervising physician I was immediately available for consultation/collaboration.    Temima Kutsch L Philmore Lepore, MD 01/23/13 1216 

## 2013-01-26 ENCOUNTER — Ambulatory Visit (INDEPENDENT_AMBULATORY_CARE_PROVIDER_SITE_OTHER): Payer: BC Managed Care – PPO | Admitting: Family Medicine

## 2013-01-26 VITALS — BP 106/64 | HR 120 | Temp 97.4°F | Resp 20 | Ht 70.0 in | Wt 187.0 lb

## 2013-01-26 DIAGNOSIS — I10 Essential (primary) hypertension: Secondary | ICD-10-CM

## 2013-01-26 DIAGNOSIS — E785 Hyperlipidemia, unspecified: Secondary | ICD-10-CM

## 2013-01-26 DIAGNOSIS — I498 Other specified cardiac arrhythmias: Secondary | ICD-10-CM

## 2013-01-26 DIAGNOSIS — R000 Tachycardia, unspecified: Secondary | ICD-10-CM

## 2013-01-26 DIAGNOSIS — E119 Type 2 diabetes mellitus without complications: Secondary | ICD-10-CM

## 2013-01-26 DIAGNOSIS — Z79899 Other long term (current) drug therapy: Secondary | ICD-10-CM

## 2013-01-26 LAB — POCT UA - MICROSCOPIC ONLY
Casts, Ur, LPF, POC: NEGATIVE
Crystals, Ur, HPF, POC: NEGATIVE
Yeast, UA: NEGATIVE

## 2013-01-26 LAB — POCT URINALYSIS DIPSTICK
Blood, UA: NEGATIVE
Nitrite, UA: NEGATIVE
Spec Grav, UA: 1.02
Urobilinogen, UA: 0.2
pH, UA: 5.5

## 2013-01-26 LAB — POCT GLYCOSYLATED HEMOGLOBIN (HGB A1C): Hemoglobin A1C: 12.8

## 2013-01-26 LAB — GLUCOSE, POCT (MANUAL RESULT ENTRY): POC Glucose: 149 mg/dl — AB (ref 70–99)

## 2013-01-26 MED ORDER — METFORMIN HCL 500 MG PO TABS: 1000.0000 mg | ORAL_TABLET | Freq: Two times a day (BID) | ORAL | Status: DC

## 2013-01-26 NOTE — Progress Notes (Signed)
[address]   [place], [place]  [address]   [phone] Subjective:    Patient ID: [name], male    DOB: 22-Apr-1950, 62 y.o.   MRN: [id number]  Chief Complaint  Patient presents with  . Follow-up   This chart was scribed for Norberto Sorenson, MD by Blanchard Kelch, ED Scribe. The patient was seen in room 8. Patient's care was started at 5:08 PM.   HPI  [name] is a 62 y.o. male who presents to office for a follow up for new diagnosis of DM. At the last visit here 5 days prev he was found to be tachycardic to 120s with orthostatic hypotension (BP 80/62) as well as mildly hypoxemia so he was transferred to ER for further evaluation. He was asymptomatic and had come to clinic for a routine refill of his Lisinopril.   He was given IV fluids in the ER and cbg was found to be 370. Therefore,  his symptoms were attributed to dehydration from new onset of Diabetes. He was started on Metformin 500 BID and told to follow up w/ PCP. His Lisinopril was held.He states that he has been doing well on the medication. He denies any side effects from the medication, including nausea or diarrhea.   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 He still has a meter, strips, and lancets at home from this and knows how to check his cbgs. However, after he was taken off prednisone, he states that the DM resolved.   His last meal was this morning approx 8 hrs ago.   He denies any dizziness, palpitations, shortness of breath, chest pain, abdominal pain, changes in eating habits or abormal urinary symptoms.   Past Medical History  Diagnosis Date  . Rheumatoid arthritis(714.0) 2005  . Pulmonary fibrosis   . Hypertension   . GERD (gastroesophageal reflux disease)    No Known Allergies Current Outpatient Prescriptions on File Prior to Visit  Medication Sig Dispense Refill  . Adalimumab (HUMIRA) 20 MG/0.4ML KIT As directed  every 2 wks      . celecoxib (CELEBREX) 200 MG capsule Take 200 mg by mouth daily.       . metFORMIN (GLUCOPHAGE) 500 MG tablet Take 1 tablet (500 mg total) by mouth 2 (two) times daily with a meal.  60 tablet  0  . albuterol (PROVENTIL) (2.5 MG/3ML) 0.083% nebulizer solution Take 3 mLs (2.5 mg total) by nebulization every 6 (six) hours as needed for wheezing.  150 mL  1  . lisinopril (PRINIVIL,ZESTRIL) 10 MG tablet Take 10 mg by mouth daily.       No current facility-administered medications on file prior to visit.     Review of Systems  Constitutional: Negative for fever and chills.  HENT: Negative for congestion and rhinorrhea.   Eyes: Negative for visual disturbance.  Respiratory: Negative for cough and shortness of breath.   Cardiovascular: Negative for chest pain and palpitations.  Gastrointestinal: Negative for nausea, abdominal pain and diarrhea.  Genitourinary: Negative for dysuria, urgency, frequency and hematuria.  Musculoskeletal: Negative for gait problem.  Skin: Negative for rash.  Neurological: Negative for dizziness and light-headedness.  Hematological: Negative for adenopathy.  Psychiatric/Behavioral: Negative for confusion.      BP 106/64  Pulse 120  Temp(Src) 97.4 F (36.3 C)  Resp 20  Ht 5\' 10"  (1.778 m)  Wt 187 lb (84.823 kg)  BMI 26.83 kg/m2  SpO2 94% Objective:   Physical Exam  Constitutional: He is oriented to person, place, and time. He appears well-developed and well-nourished. No distress.  Strong odor of urine  HENT:  Head: Normocephalic and atraumatic.  Eyes: Conjunctivae and EOM are normal. Pupils are equal, round, and reactive to light. No scleral icterus.  Neck: Normal range of motion. Neck supple. No thyromegaly present.  Cardiovascular: Regular rhythm, normal heart sounds and intact distal pulses.  Tachycardia present.   Pulmonary/Chest: Effort normal. No respiratory distress. He has rales.  Bibasilar inspiratory and expiratory rales    Musculoskeletal: Normal range of motion. He exhibits no edema.  Lymphadenopathy:    He has no cervical adenopathy.  Neurological: He is alert and oriented to person, place, and time.  Skin: Skin is warm and dry. He is not diaphoretic.  Psychiatric: He has a normal mood and affect. His behavior is normal.   Results for orders placed in visit on 01/26/13  GLUCOSE, POCT (MANUAL RESULT ENTRY)      Result Value Range   POC Glucose 149 (*) 70 - 99 mg/dl  POCT GLYCOSYLATED HEMOGLOBIN (HGB A1C)      Result Value Range   Hemoglobin A1C 12.8    POCT URINALYSIS DIPSTICK      Result Value Range   Color, UA yellow     Clarity, UA clear     Glucose, UA 100     Bilirubin, UA neg     Ketones, UA trace     Spec Grav, UA 1.020     Blood, UA neg     pH, UA 5.5     Protein, UA trace     Urobilinogen, UA 0.2     Nitrite, UA neg     Leukocytes, UA Negative        Assessment & Plan:   Type II or unspecified type diabetes mellitus without mention of complication, not stated as uncontrolled - Plan: Ambulatory referral to diabetic education, POCT glucose (manual entry), POCT glycosylated hemoglobin (Hb A1C), POCT UA - Microscopic Only, Lipid panel, TSH, Comprehensive metabolic panel, Microalbumin, urine, POCT urinalysis dipstick.  Patient given paper prescription for glucometer strips and lancets. Increase metformin from 500 bid to 1000 bid. Start checking cbgs qam fasting and 2 hrs postprandial, record and bring to f/u in 3-4d. Refer for diabetic education.  Essential hypertension, benign - Plan: POCT glucose (manual entry), POCT glycosylated hemoglobin (Hb A1C), POCT UA - Microscopic Only, Lipid panel, TSH, Comprehensive metabolic panel, Microalbumin, urine, POCT urinalysis dipstick  Sinus tachycardia - Plan: POCT glucose (manual entry), POCT glycosylated hemoglobin (Hb A1C), POCT UA - Microscopic Only, Lipid panel, TSH, Comprehensive metabolic panel, Microalbumin, urine, POCT urinalysis dipstick -  suspect pt is still dehydrated from his hyperglycemia. + orthostatic VS today but pt asymptomatic. Advised IVF today but pt declines, he will push fluids - esp water intake - and recheck in 3-4d.  Cont to hold lisinopril until dehydration is completely resolved.   Encounter for long-term (current) use of other medications - Plan: POCT glucose (manual entry), POCT glycosylated hemoglobin (Hb A1C), POCT UA - Microscopic Only, Lipid panel, TSH, Comprehensive metabolic panel, Microalbumin, urine, POCT urinalysis dipstick  Meds ordered this encounter  Medications  . metFORMIN (GLUCOPHAGE) 500 MG tablet    Sig: Take 2 tablets (1,000 mg total) by mouth 2 (two) times daily with a meal.    Dispense:  60 tablet    Refill:  0    Order Specific  Question:  Supervising Provider    Answer:  Eber Hong D [[id number]]    I personally performed the services described in this documentation, which was scribed in my presence. The recorded information has been reviewed and considered, and addended by me as needed.  Norberto Sorenson, MD MPH

## 2013-01-26 NOTE — Patient Instructions (Addendum)
Increase your metformin to 2 tabs in the morning with breakfast and 2 tabs with supper. Recheck here in 4 days as you are still dehydrated due to your high sugars. You need to drink tons of water to correct the dehydration and we will need to keep a very close eye on your and your blood work until this resolves.  If you develop any symptoms of chest pain, palpitations, headaches, lightheadedness, or feeling like you are going to pass out, please return to clinic immediately for further evaluation. Continue to NOT take your lisinopril for the current time. Diabetes, Frequently Asked Questions WHAT IS DIABETES? Most of the food we eat is turned into glucose (sugar). Our bodies use it for energy. The pancreas makes a hormone called insulin. It helps glucose get into the cells of our bodies. When you have diabetes, your body either does not make enough insulin or cannot use its own insulin as well as it should. This causes sugars to build up in your blood. WHAT ARE THE SYMPTOMS OF DIABETES?  Frequent urination.  Excessive thirst.  Unexplained weight loss.  Extreme hunger.  Blurred vision.  Tingling or numbness in hands or feet.  Feeling very tired much of the time.  Dry, itchy skin.  Sores that are slow to heal.  Yeast infections. WHAT ARE THE TYPES OF DIABETES? Type 1 Diabetes   About 10% of affected people have this type.  Usually occurs before the age of 4.  Usually occurs in thin to normal weight people. Type 2 Diabetes  About 90% of affected people have this type.  Usually occurs after the age of 64.  Usually occurs in overweight people.  More likely to have:  A family history of diabetes.  A history of diabetes during pregnancy (gestational diabetes).  High blood pressure.  High cholesterol and triglycerides. Gestational Diabetes  Occurs in about 4% of pregnancies.  Usually goes away after the baby is born.  More likely to occur in women with:  Family  history of diabetes.  Previous gestational diabetes.  Obese.  Over 48 years old. WHAT IS PRE-DIABETES? Pre-diabetes means your blood glucose is higher than normal, but lower than the diabetes range. It also means you are at risk of getting type 2 diabetes and heart disease. If you are told you have pre-diabetes, have your blood glucose checked again in 1 to 2 years. WHAT IS THE TREATMENT FOR DIABETES? Treatment is aimed at keeping blood glucose near normal levels at all times. Learning how to manage this yourself is important in treating diabetes. Depending on the type of diabetes you have, your treatment will include one or more of the following:  Monitoring your blood glucose.  Meal planning.  Exercise.  Oral medicine (pills) or insulin. CAN DIABETES BE PREVENTED? With type 1 diabetes, prevention is more difficult, because the triggers that cause it are not yet known. With type 2 diabetes, prevention is more likely, with lifestyle changes:  Maintain a healthy weight.  Eat healthy.  Exercise. IS THERE A CURE FOR DIABETES? No, there is no cure for diabetes. There is a lot of research going on that is looking for a cure, and progress is being made. Diabetes can be treated and controlled. People with diabetes can manage their diabetes and lead normal, active lives. SHOULD I BE TESTED FOR DIABETES? If you are at least 64 years old, you should be tested for diabetes. You should be tested again every 3 years. If you are 45 or  older and overweight, you may want to get tested more often. If you are younger than 45, overweight, and have one or more of the following risk factors, you should be tested:  Family history of diabetes.  Inactive lifestyle.  High blood pressure. WHAT ARE SOME OTHER SOURCES FOR INFORMATION ON DIABETES? The following organizations may help in your search for more information on diabetes: National Diabetes Education Program (NDEP) Internet:  SolarDiscussions.es American Diabetes Association Internet: http://www.diabetes.org  Juvenile Diabetes Foundation International Internet: [employer] Document Released: 03/15/2003 Document Revised: 06/04/2011 Document Reviewed: 01/07/2009 [facility] Patient Information 2014 [place], [place]. Blood Sugar Monitoring, Adult GLUCOSE METERS FOR SELF-MONITORING OF BLOOD GLUCOSE  It is important to be able to correctly measure your blood sugar (glucose). You can use a blood glucose monitor (a small battery-operated device) to check your glucose level at any time. This allows you and your caregiver to monitor your diabetes and to determine how well your treatment plan is working. The process of monitoring your blood glucose with a glucose meter is called self-monitoring of blood glucose (SMBG). When people with diabetes control their blood sugar, they have better health. To test for glucose with a typical glucose meter, place the disposable strip in the meter. Then place a small sample of blood on the "test strip." The test strip is coated with chemicals that combine with glucose in blood. The meter measures how much glucose is present. The meter displays the glucose level as a number. Several new models can record and store a number of test results. Some models can connect to personal computers to store test results or print them out.  Newer meters are often easier to use than older models. Some meters allow you to get blood from places other than your fingertip. Some new models have automatic timing, error codes, signals, or barcode readers to help with proper adjustment (calibration). Some meters have a large display screen or spoken instructions for people with visual impairments.  INSTRUCTIONS FOR USING GLUCOSE METERS  Wash your hands with soap and warm water, or clean the area with alcohol. Dry your hands completely.  Prick the side of your fingertip with a lancet (a sharp-pointed  tool used by hand).  Hold the hand down and gently milk the finger until a small drop of blood appears. Catch the blood with the test strip.  Follow the instructions for inserting the test strip and using the SMBG meter. Most meters require the meter to be turned on and the test strip to be inserted before applying the blood sample.  Record the test result.  Read the instructions carefully for both the meter and the test strips that go with it. Meter instructions are found in the user manual. Keep this manual to help you solve any problems that may arise. Many meters use "error codes" when there is a problem with the meter, the test strip, or the blood sample on the strip. You will need the manual to understand these error codes and fix the problem.  New devices are available such as laser lancets and meters that can test blood taken from "alternative sites" of the body, other than fingertips. However, you should use standard fingertip testing if your glucose changes rapidly. Also, use standard testing if:  You have eaten, exercised, or taken insulin in the past 2 hours.  You think your glucose is low.  You tend to not feel symptoms of low blood glucose (hypoglycemia).  You are ill or under stress.  Clean the  meter as directed by the manufacturer.  Test the meter for accuracy as directed by the manufacturer.  Take your meter with you to your caregiver's office. This way, you can test your glucose in front of your caregiver to make sure you are using the meter correctly. Your caregiver can also take a sample of blood to test using a routine lab method. If values on the glucose meter are close to the lab results, you and your caregiver will see that your meter is working well and you are using good technique. Your caregiver will advise you about what to do if the results do not match. FREQUENCY OF TESTING  Your caregiver will tell you how often you should check your blood glucose. This will  depend on your type of diabetes, your current level of diabetes control, and your types of medicines. The following are general guidelines, but your care plan may be different. Record all your readings and the time of day you took them for review with your caregiver.   Diabetes type 1.  When you are using insulin with good diabetic control (either multiple daily injections or via a pump), you should check your glucose 4 times a day.  If your diabetes is not well controlled, you may need to monitor more frequently, including before meals and 2 hours after meals, at bedtime, and occasionally between 2 a.m. and 3 a.m.  You should always check your glucose before a dose of insulin or before changing the rate on your insulin pump.  Diabetes type 2.  Guidelines for SMBG in diabetes type 2 are not as well defined.  If you are on insulin, follow the guidelines above.  If you are on medicines, but not insulin, and your glucose is not well controlled, you should test at least twice daily.  If you are not on insulin, and your diabetes is controlled with medicines or diet alone, you should test at least once daily, usually before breakfast.  A weekly profile will help your caregiver advise you on your care plan. The week before your visit, check your glucose before a meal and 2 hours after a meal at least daily. You may want to test before and after a different meal each day so you and your caregiver can tell how well controlled your blood sugars are throughout the course of a 24 hour period.  Gestational diabetes (diabetes during pregnancy).  Frequent testing is often necessary. Accurate timing is important.  If you are not on insulin, check your glucose 4 times a day. Check it before breakfast and 1 hour after the start of each meal.  If you are on insulin, check your glucose 6 times a day. Check it before each meal and 1 hour after the first bite of each meal.  General guidelines.  More  frequent testing is required at the start of insulin treatment. Your caregiver will instruct you.  Test your glucose any time you suspect you have low blood sugar (hypoglycemia).  You should test more often when you change medicines, when you have unusual stress or illness, or in other unusual circumstances. OTHER THINGS TO KNOW ABOUT GLUCOSE METERS  Measurement Range. Most glucose meters are able to read glucose levels over a broad range of values from as low as 0 to as high as 600 mg/dL. If you get an extremely high or low reading from your meter, you should first confirm it with another reading. Report very high or very low readings to your  caregiver.  Whole Blood Glucose versus Plasma Glucose. Some older home glucose meters measure glucose in your whole blood. In a lab or when using some newer home glucose meters, the glucose is measured in your plasma (one component of blood). The difference can be important. It is important for you and your caregiver to know whether your meter gives its results as "whole blood equivalent" or "plasma equivalent."  Display of High and Low Glucose Values. Part of learning how to operate a meter is understanding what the meter results mean. Know how high and low glucose concentrations are displayed on your meter.  Factors that Affect Glucose Meter Performance. The accuracy of your test results depends on many factors and varies depending on the brand and type of meter. These factors include:  Low red blood cell count (anemia).  Substances in your blood (such as uric acid, vitamin C, and others).  Environmental factors (temperature, humidity, altitude).  Name-brand versus generic test strips.  Calibration. Make sure your meter is set up properly. It is a good idea to do a calibration test with a control solution recommended by the manufacturer of your meter whenever you begin using a fresh bottle of test strips. This will help verify the accuracy of your  meter.  Improperly stored, expired, or defective test strips. Keep your strips in a dry place with the lid on.  Soiled meter.  Inadequate blood sample. NEW TECHNOLOGIES FOR GLUCOSE TESTING Alternative site testing Some glucose meters allow testing blood from alternative sites. These include the:  Upper arm.  Forearm.  Base of the thumb.  Thigh. Sampling blood from alternative sites may be desirable. However, it may have some limitations. Blood in the fingertips show changes in glucose levels more quickly than blood in other parts of the body. This means that alternative site test results may be different from fingertip test results, not because of the meter's ability to test accurately, but because the actual glucose concentration can be different.  Continuous Glucose Monitoring Devices to measure your blood glucose continuously are available, and others are in development. These methods can be more expensive than self-monitoring with a glucose meter. However, it is uncertain how effective and reliable these devices are. Your caregiver will advise you if this approach makes sense for you. IF BLOOD SUGARS ARE CONTROLLED, PEOPLE WITH DIABETES REMAIN HEALTHIER.  SMBG is an important part of the treatment plan of patients with diabetes mellitus. Below are reasons for using SMBG:   It confirms that your glucose is at a specific, healthy level.  It detects hypoglycemia and severe hyperglycemia.  It allows you and your caregiver to make adjustments in response to changes in lifestyle for individuals requiring medicine.  It determines the need for starting insulin therapy in temporary diabetes that happens during pregnancy (gestational diabetes). Document Released: 03/15/2003 Document Revised: 06/04/2011 Document Reviewed: 07/06/2010 [facility] Patient Information 2014 [place], [place]. Diabetes Meal Planning Guide The diabetes meal planning guide is a tool to help you plan your meals and  snacks. It is important for people with diabetes to manage their blood glucose (sugar) levels. Choosing the right foods and the right amounts throughout your day will help control your blood glucose. Eating right can even help you improve your blood pressure and reach or maintain a healthy weight. CARBOHYDRATE COUNTING MADE EASY When you eat carbohydrates, they turn to sugar. This raises your blood glucose level. Counting carbohydrates can help you control this level so you feel better. When you plan your  meals by counting carbohydrates, you can have more flexibility in what you eat and balance your medicine with your food intake. Carbohydrate counting simply means adding up the total amount of carbohydrate grams in your meals and snacks. Try to eat about the same amount at each meal. Foods with carbohydrates are listed below. Each portion below is 1 carbohydrate serving or 15 grams of carbohydrates. Ask your dietician how many grams of carbohydrates you should eat at each meal or snack. Grains and Starches  1 slice bread.   English muffin or hotdog/hamburger bun.   cup cold cereal (unsweetened).   cup cooked pasta or rice.   cup starchy vegetables (corn, potatoes, peas, beans, winter squash).  1 tortilla (6 inches).   bagel.  1 waffle or pancake (size of a CD).   cup cooked cereal.  4 to 6 small crackers. *Whole grain is recommended. Fruit  1 cup fresh unsweetened berries, melon, papaya, pineapple.  1 small fresh fruit.   banana or mango.   cup fruit juice (4 oz unsweetened).   cup canned fruit in natural juice or water.  2 tbs dried fruit.  12 to 15 grapes or cherries. Milk and Yogurt  1 cup fat-free or 1% milk.  1 cup soy milk.  6 oz light yogurt with sugar-free sweetener.  6 oz low-fat soy yogurt.  6 oz plain yogurt. Vegetables  1 cup raw or  cup cooked is counted as 0 carbohydrates or a "free" food.  If you eat 3 or more servings at 1 meal, count  them as 1 carbohydrate serving. Other Carbohydrates   oz chips or pretzels.   cup ice cream or frozen yogurt.   cup sherbet or sorbet.  2 inch square cake, no frosting.  1 tbs honey, sugar, jam, jelly, or syrup.  2 small cookies.  3 squares of graham crackers.  3 cups popcorn.  6 crackers.  1 cup broth-based soup.  Count 1 cup casserole or other mixed foods as 2 carbohydrate servings.  Foods with less than 20 calories in a serving may be counted as 0 carbohydrates or a "free" food. You may want to purchase a book or computer software that lists the carbohydrate gram counts of different foods. In addition, the nutrition facts panel on the labels of the foods you eat are a good source of this information. The label will tell you how big the serving size is and the total number of carbohydrate grams you will be eating per serving. Divide this number by 15 to obtain the number of carbohydrate servings in a portion. Remember, 1 carbohydrate serving equals 15 grams of carbohydrate. SERVING SIZES Measuring foods and serving sizes helps you make sure you are getting the right amount of food. The list below tells how big or small some common serving sizes are.  1 oz.........4 stacked dice.  3 oz........Marland KitchenDeck of cards.  1 tsp.......Marland KitchenTip of little finger.  1 tbs......Marland KitchenMarland KitchenThumb.  2 tbs.......Marland KitchenGolf ball.   cup......Marland KitchenHalf of a fist.  1 cup.......Marland KitchenA fist. SAMPLE DIABETES MEAL PLAN Below is a sample meal plan that includes foods from the grain and starches, dairy, vegetable, fruit, and meat groups. A dietician can individualize a meal plan to fit your calorie needs and tell you the number of servings needed from each food group. However, controlling the total amount of carbohydrates in your meal or snack is more important than making sure you include all of the food groups at every meal. You may interchange carbohydrate containing foods (  dairy, starches, and fruits). The meal plan  below is an example of a 2000 calorie diet using carbohydrate counting. This meal plan has 17 carbohydrate servings. Breakfast  1 cup oatmeal (2 carb servings).   cup light yogurt (1 carb serving).  1 cup blueberries (1 carb serving).   cup almonds. Snack  1 large apple (2 carb servings).  1 low-fat string cheese stick. Lunch  Chicken breast salad.  1 cup spinach.   cup chopped tomatoes.  2 oz chicken breast, sliced.  2 tbs low-fat [place] dressing.  12 whole-wheat crackers (2 carb servings).  12 to 15 grapes (1 carb serving).  1 cup low-fat milk (1 carb serving). Snack  1 cup carrots.   cup hummus (1 carb serving). Dinner  3 oz broiled salmon.  1 cup brown rice (3 carb servings). Snack  1  cups steamed broccoli (1 carb serving) drizzled with 1 tsp olive oil and lemon juice.  1 cup light pudding (2 carb servings). DIABETES MEAL PLANNING WORKSHEET Your dietician can use this worksheet to help you decide how many servings of foods and what types of foods are right for you.  BREAKFAST Food Group and Servings / Carb Servings Grain/Starches __________________________________ Dairy __________________________________________ Vegetable ______________________________________ Fruit ___________________________________________ Meat __________________________________________ Fat ____________________________________________ LUNCH Food Group and Servings / Carb Servings Grain/Starches ___________________________________ Dairy ___________________________________________ Fruit ____________________________________________ Meat ___________________________________________ Fat _____________________________________________ Laural Golden Food Group and Servings / Carb Servings Grain/Starches ___________________________________ Dairy ___________________________________________ Fruit ____________________________________________ Meat  ___________________________________________ Fat _____________________________________________ SNACKS Food Group and Servings / Carb Servings Grain/Starches ___________________________________ Dairy ___________________________________________ Vegetable _______________________________________ Fruit ____________________________________________ Meat ___________________________________________ Fat _____________________________________________ DAILY TOTALS Starches _________________________ Vegetable ________________________ Fruit ____________________________ Dairy ____________________________ Meat ____________________________ Fat ______________________________ Document Released: 12/07/2004 Document Revised: 06/04/2011 Document Reviewed: 10/18/2008 [employer] Patient Information 2014 [place] [place].

## 2013-01-27 DIAGNOSIS — I10 Essential (primary) hypertension: Secondary | ICD-10-CM | POA: Insufficient documentation

## 2013-01-27 DIAGNOSIS — E1165 Type 2 diabetes mellitus with hyperglycemia: Secondary | ICD-10-CM | POA: Insufficient documentation

## 2013-01-27 DIAGNOSIS — IMO0002 Reserved for concepts with insufficient information to code with codable children: Secondary | ICD-10-CM | POA: Insufficient documentation

## 2013-01-27 LAB — COMPREHENSIVE METABOLIC PANEL
ALT: 23 U/L (ref 0–53)
Albumin: 4.4 g/dL (ref 3.5–5.2)
CO2: 26 mEq/L (ref 19–32)
Calcium: 10.2 mg/dL (ref 8.4–10.5)
Chloride: 101 mEq/L (ref 96–112)
Creat: 0.79 mg/dL (ref 0.50–1.35)
Glucose, Bld: 147 mg/dL — ABNORMAL HIGH (ref 70–99)
Potassium: 5.1 mEq/L (ref 3.5–5.3)
Total Protein: 8.2 g/dL (ref 6.0–8.3)

## 2013-01-27 LAB — LIPID PANEL
HDL: 36 mg/dL — ABNORMAL LOW (ref >39)
Triglycerides: 177 mg/dL — ABNORMAL HIGH (ref <150)

## 2013-01-27 LAB — MICROALBUMIN, URINE: Microalb, Ur: 4.56 mg/dL — ABNORMAL HIGH (ref 0.00–1.89)

## 2013-01-27 LAB — TSH: TSH: 0.674 u[IU]/mL (ref 0.350–4.500)

## 2013-01-28 ENCOUNTER — Encounter: Payer: Self-pay | Admitting: Family Medicine

## 2013-01-28 DIAGNOSIS — E785 Hyperlipidemia, unspecified: Secondary | ICD-10-CM | POA: Insufficient documentation

## 2013-01-28 MED ORDER — PRAVASTATIN SODIUM 40 MG PO TABS: 40.0000 mg | ORAL_TABLET | Freq: Every day | ORAL | Status: DC

## 2013-01-28 NOTE — Addendum Note (Signed)
Addended by: Norberto Sorenson on: 01/28/2013 09:09 AM   Modules accepted: Orders

## 2013-01-29 ENCOUNTER — Ambulatory Visit (INDEPENDENT_AMBULATORY_CARE_PROVIDER_SITE_OTHER): Payer: BC Managed Care – PPO | Admitting: Family Medicine

## 2013-01-29 VITALS — BP 122/66 | HR 103 | Temp 97.8°F | Resp 20 | Ht 70.0 in | Wt 186.4 lb

## 2013-01-29 DIAGNOSIS — E785 Hyperlipidemia, unspecified: Secondary | ICD-10-CM

## 2013-01-29 DIAGNOSIS — IMO0001 Reserved for inherently not codable concepts without codable children: Secondary | ICD-10-CM

## 2013-01-29 DIAGNOSIS — I10 Essential (primary) hypertension: Secondary | ICD-10-CM

## 2013-01-29 DIAGNOSIS — J8410 Pulmonary fibrosis, unspecified: Secondary | ICD-10-CM

## 2013-01-29 DIAGNOSIS — R05 Cough: Secondary | ICD-10-CM

## 2013-01-29 DIAGNOSIS — R059 Cough, unspecified: Secondary | ICD-10-CM

## 2013-01-29 MED ORDER — LOSARTAN POTASSIUM 25 MG PO TABS: 25.0000 mg | ORAL_TABLET | Freq: Every day | ORAL | Status: DC

## 2013-01-29 MED ORDER — METFORMIN HCL 1000 MG PO TABS: 1000.0000 mg | ORAL_TABLET | Freq: Two times a day (BID) | ORAL | Status: DC

## 2013-01-29 NOTE — Patient Instructions (Addendum)
Start losartan instead of lisinopril as it is much less likely to make you cough and start the pravastatin cholesterol medication.  Come back in 3 months for a check-up and labs.  If you have any questions, please call.  Diabetes Meal Planning Guide The diabetes meal planning guide is a tool to help you plan your meals and snacks. It is important for people with diabetes to manage their blood glucose (sugar) levels. Choosing the right foods and the right amounts throughout your day will help control your blood glucose. Eating right can even help you improve your blood pressure and reach or maintain a healthy weight. CARBOHYDRATE COUNTING MADE EASY When you eat carbohydrates, they turn to sugar. This raises your blood glucose level. Counting carbohydrates can help you control this level so you feel better. When you plan your meals by counting carbohydrates, you can have more flexibility in what you eat and balance your medicine with your food intake. Carbohydrate counting simply means adding up the total amount of carbohydrate grams in your meals and snacks. Try to eat about the same amount at each meal. Foods with carbohydrates are listed below. Each portion below is 1 carbohydrate serving or 15 grams of carbohydrates. Ask your dietician how many grams of carbohydrates you should eat at each meal or snack. Grains and Starches  1 slice bread.   English muffin or hotdog/hamburger bun.   cup cold cereal (unsweetened).   cup cooked pasta or rice.   cup starchy vegetables (corn, potatoes, peas, beans, winter squash).  1 tortilla (6 inches).   bagel.  1 waffle or pancake (size of a CD).   cup cooked cereal.  4 to 6 small crackers. *Whole grain is recommended. Fruit  1 cup fresh unsweetened berries, melon, papaya, pineapple.  1 small fresh fruit.   banana or mango.   cup fruit juice (4 oz unsweetened).   cup canned fruit in natural juice or water.  2 tbs dried fruit.  12  to 15 grapes or cherries. Milk and Yogurt  1 cup fat-free or 1% milk.  1 cup soy milk.  6 oz light yogurt with sugar-free sweetener.  6 oz low-fat soy yogurt.  6 oz plain yogurt. Vegetables  1 cup raw or  cup cooked is counted as 0 carbohydrates or a "free" food.  If you eat 3 or more servings at 1 meal, count them as 1 carbohydrate serving. Other Carbohydrates   oz chips or pretzels.   cup ice cream or frozen yogurt.   cup sherbet or sorbet.  2 inch square cake, no frosting.  1 tbs honey, sugar, jam, jelly, or syrup.  2 small cookies.  3 squares of graham crackers.  3 cups popcorn.  6 crackers.  1 cup broth-based soup.  Count 1 cup casserole or other mixed foods as 2 carbohydrate servings.  Foods with less than 20 calories in a serving may be counted as 0 carbohydrates or a "free" food. You may want to purchase a book or computer software that lists the carbohydrate gram counts of different foods. In addition, the nutrition facts panel on the labels of the foods you eat are a good source of this information. The label will tell you how big the serving size is and the total number of carbohydrate grams you will be eating per serving. Divide this number by 15 to obtain the number of carbohydrate servings in a portion. Remember, 1 carbohydrate serving equals 15 grams of carbohydrate. SERVING SIZES Measuring foods  and serving sizes helps you make sure you are getting the right amount of food. The list below tells how big or small some common serving sizes are.  1 oz.........4 stacked dice.  3 oz........Marland KitchenDeck of cards.  1 tsp.......Marland KitchenTip of little finger.  1 tbs......Marland KitchenMarland KitchenThumb.  2 tbs.......Marland KitchenGolf ball.   cup......Marland KitchenHalf of a fist.  1 cup.......Marland KitchenA fist. SAMPLE DIABETES MEAL PLAN Below is a sample meal plan that includes foods from the grain and starches, dairy, vegetable, fruit, and meat groups. A dietician can individualize a meal plan to fit your calorie  needs and tell you the number of servings needed from each food group. However, controlling the total amount of carbohydrates in your meal or snack is more important than making sure you include all of the food groups at every meal. You may interchange carbohydrate containing foods (dairy, starches, and fruits). The meal plan below is an example of a 2000 calorie diet using carbohydrate counting. This meal plan has 17 carbohydrate servings. Breakfast  1 cup oatmeal (2 carb servings).   cup light yogurt (1 carb serving).  1 cup blueberries (1 carb serving).   cup almonds. Snack  1 large apple (2 carb servings).  1 low-fat string cheese stick. Lunch  Chicken breast salad.  1 cup spinach.   cup chopped tomatoes.  2 oz chicken breast, sliced.  2 tbs low-fat [place] dressing.  12 whole-wheat crackers (2 carb servings).  12 to 15 grapes (1 carb serving).  1 cup low-fat milk (1 carb serving). Snack  1 cup carrots.   cup hummus (1 carb serving). Dinner  3 oz broiled salmon.  1 cup brown rice (3 carb servings). Snack  1  cups steamed broccoli (1 carb serving) drizzled with 1 tsp olive oil and lemon juice.  1 cup light pudding (2 carb servings). DIABETES MEAL PLANNING WORKSHEET Your dietician can use this worksheet to help you decide how many servings of foods and what types of foods are right for you.  BREAKFAST Food Group and Servings / Carb Servings Grain/Starches __________________________________ Dairy __________________________________________ Vegetable ______________________________________ Fruit ___________________________________________ Meat __________________________________________ Fat ____________________________________________ LUNCH Food Group and Servings / Carb Servings Grain/Starches ___________________________________ Dairy ___________________________________________ Fruit ____________________________________________ Meat  ___________________________________________ Fat _____________________________________________ Laural Golden Food Group and Servings / Carb Servings Grain/Starches ___________________________________ Dairy ___________________________________________ Fruit ____________________________________________ Meat ___________________________________________ Fat _____________________________________________ SNACKS Food Group and Servings / Carb Servings Grain/Starches ___________________________________ Dairy ___________________________________________ Vegetable _______________________________________ Fruit ____________________________________________ Meat ___________________________________________ Fat _____________________________________________ DAILY TOTALS Starches _________________________ Vegetable ________________________ Fruit ____________________________ Dairy ____________________________ Meat ____________________________ Fat ______________________________ Document Released: 12/07/2004 Document Revised: 06/04/2011 Document Reviewed: 10/18/2008 [employer] Patient Information 2014 [employer]. Diabetes, Eating Away From Home Sometimes, you might eat in a restaurant or have meals that are prepared by someone else. You can enjoy eating out. However, the portions in restaurants may be much larger than needed. Listed below are some ideas to help you choose foods that will keep your blood glucose (sugar) in better control.  TIPS FOR EATING OUT  Know your meal plan and how many carbohydrate servings you should have at each meal. You may wish to carry a copy of your meal plan in your purse or wallet. Learn the foods included in each food group.  Make a list of restaurants near you that offer healthy choices. Take a copy of the carry-out menus to see what they offer. Then, you can plan what you will order ahead of time.  Become familiar with serving sizes by practicing them at home using measuring  cups  and spoons. Once you learn to recognize portion sizes, you will be able to correctly estimate the amount of total carbohydrate you are allowed to eat at the restaurant. Ask for a takeout box if the portion is more than you should have. When your food comes, leave the amount you should have on the plate, and put the rest in the takeout box before you start eating.  Plan ahead if your mealtime will be different from usual. Check with your caregiver to find out how to time meals and medicine if you are taking insulin.  Avoid high-fat foods, such as fried foods, cream sauces, high-fat salad dressings, or any added butter or margarine.  Do not be afraid to ask questions. Ask your server about the portion size, cooking methods, ingredients and if items can be substituted. Restaurants do not list all available items on the menu. You can ask for your main entree to be prepared using skim milk, oil instead of butter or margarine, and without gravy or sauces. Ask your waiter or waitress to serve salad dressings, gravy, sauces, margarine, and sour cream on the side. You can then add the amount your meal plan suggests.  Add more vegetables whenever possible.  Avoid items that are labeled "jumbo," "giant," "deluxe," or "supersized."  You may want to split an entre with someone and order an extra side salad.  Watch for hidden calories in foods like croutons, bacon, or cheese.  Ask your server to take away the bread basket or chips from your table.  Order a dinner salad as an appetizer. You can eat most foods served in a restaurant. Some foods are better choices than others. Breads and Starches  Recommended: All kinds of bread (wheat, rye, white, oatmeal, [place], [place], raisin), hard or soft dinner rolls, frankfurter or hamburger buns, small bagels, small corn or whole-wheat flour tortillas.  Avoid: Frosted or glazed breads, butter rolls, egg or cheese breads, croissants, sweet rolls, pastries,  coffee cake, glazed or frosted doughnuts, muffins. Crackers  Recommended: Animal crackers, graham, rye, saltine, oyster, and matzoth crackers. Bread sticks, melba toast, rusks, pretzels, popcorn (without fat), zwieback toast.  Avoid: High-fat snack crackers or chips. Buttered popcorn. Cereals  Recommended: Hot and cold cereals. Whole grains such as oatmeal or shredded wheat are good choices.  Avoid: Sugar-coated or granola type cereals. Potatoes/Pasta/Rice/Beans  Recommended: Order baked, boiled, or mashed potatoes, rice or noodles without added fat, whole beans. Order gravies, butter, margarine, or sauces on the side so you can control the amount you add.  Avoid: Hash browns or fried potatoes. Potatoes, pasta, or rice prepared with cream or cheese sauce. Potato or pasta salads prepared with large amounts of dressing. Fried beans or fried rice. Vegetables  Recommended: Order steamed, baked, boiled, or stewed vegetables without sauces or extra fat. Ask that sauce be served on the side. If vegetables are not listed on the menu, ask what is available.  Avoid: Vegetables prepared with cream, butter, or cheese sauce. Fried vegetables. Salad Bars  Recommended: Many of the vegetables at a salad bar are considered "free." Use lemon juice, vinegar, or low-calorie salad dressing (fewer than 20 calories per serving) as "free" dressings for your salad. Look for salad bar ingredients that have no added fat or sugar such as tomatoes, lettuce, cucumbers, broccoli, carrots, onions, and mushrooms.  Avoid: Prepared salads with large amounts of dressing, such as coleslaw, caesar salad, macaroni salad, bean salad, or carrot salad. Fruit  Recommended: Eat fresh fruit or fresh fruit  salad without added dressing. A salad bar often offers fresh fruit choices, but canned fruit at a restaurant is usually packed in sugar or syrup.  Avoid: Sweetened canned or frozen fruits, plain or sweetened fruit juice. Fruit  salads with dressing, sour cream, or sugar added to them. Meat and Meat Substitutes  Recommended: Order broiled, baked, roasted, or grilled meat, poultry, or fish. Trim off all visible fat. Do not eat the skin of poultry. The size stated on the menu is the raw weight. Meat shrinks by  in cooking (for example, 4 oz raw equals 3 oz cooked meat).  Avoid: Deep-fat fried meat, poultry, or fish. Breaded meats. Eggs  Recommended: Order soft, hard-cooked, poached, or scrambled eggs. Omelets may be okay, depending on what ingredients are added. Egg substitutes are also a good choice.  Avoid: Fried eggs, eggs prepared with cream or cheese sauce. Milk  Recommended: Order low-fat or fat-free milk according to your meal plan. Plain, nonfat yogurt or flavored yogurt with no sugar added may be used as a substitute for milk. Soy milk may also be used.  Avoid: Milk shakes or sweetened milk beverages. Soups and Combination Foods  Recommended: Clear broth or consomm are "free" foods and may be used as an appetizer. Broth-based soups with fat removed count as a starch serving and are preferred over cream soups. Soups made with beans or split peas may be eaten but count as a starch.  Avoid: Fatty soups, soup made with cream, cheese soup. Combination foods prepared with excessive amounts of fat or with cream or cheese sauces. Desserts and Sweets  Recommended: Ask for fresh fruit. Sponge or angel food cake without icing, ice milk, no sugar added ice cream, sherbet, or frozen yogurt may fit into your meal plan occasionally.  Avoid: Pastries, puddings, pies, cakes with icing, custard, gelatin desserts. Fats and Oils  Recommended: Choose healthy fats such as olive oil, canola oil, or tub margarine, reduced fat or fat-free sour cream, cream cheese, avocado, or nuts.  Avoid: Any fats in excess of your allowed portion. Deep-fried foods or any food with a large amount of fat. Note: Ask for all fats to be served  on the side, and limit your portion sizes according to your meal plan. Document Released: 03/12/2005 Document Revised: 06/04/2011 Document Reviewed: 09/30/2008 [facility] Patient Information 2014 [place], [place].

## 2013-01-29 NOTE — Progress Notes (Signed)
Subjective:    Patient ID: [name], male    DOB: 1950-06-05, 62 y.o.   MRN: [id number] Chief Complaint  Patient presents with  . Follow-up    diabetes    HPI   Has been checking cbgs 101-105 after dinner, 110-113 fasting qam. Tolerating 1000mg  bid of metformin w/o side effects.  Has lots of questions today about what he can and cannot eat.  Has not started pravastatin yet.  Doesn't want to restart lisinopril as it makes his chronic cough from pulmonary fibrosis much worse.  No lightheadedness or dizziness w/ position change. Has been drinking 3-4 20 oz bottles of water daily and trying to keep urine clear.  Past Medical History  Diagnosis Date  . Rheumatoid arthritis(714.0) 2005  . Pulmonary fibrosis   . Hypertension   . GERD (gastroesophageal reflux disease)   . Diabetes mellitus without complication    Current Outpatient Prescriptions on File Prior to Visit  Medication Sig Dispense Refill  . Adalimumab (HUMIRA) 20 MG/0.4ML KIT As directed every 2 wks      . albuterol (PROVENTIL) (2.5 MG/3ML) 0.083% nebulizer solution Take 3 mLs (2.5 mg total) by nebulization every 6 (six) hours as needed for wheezing.  150 mL  1  . celecoxib (CELEBREX) 200 MG capsule Take 200 mg by mouth daily.       . pravastatin (PRAVACHOL) 40 MG tablet Take 1 tablet (40 mg total) by mouth at bedtime.  90 tablet  1   No current facility-administered medications on file prior to visit.   No Known Allergies  Review of Systems  Constitutional: Negative for fever and chills.  Eyes: Negative for visual disturbance.  Respiratory: Positive for cough.   Cardiovascular: Negative for chest pain and leg swelling.  Musculoskeletal: Positive for arthralgias and joint swelling.  Neurological: Negative for dizziness, syncope, facial asymmetry, weakness, light-headedness and headaches.      BP 122/66  Pulse 103  Temp(Src) 97.8 F (36.6 C) (Oral)  Resp 20  Ht 5\' 10"  (1.778 m)  Wt 186 lb 6.4 oz (84.55 kg)   BMI 26.75 kg/m2  SpO2 98% Objective:   Physical Exam  Constitutional: He is oriented to person, place, and time. He appears well-developed and well-nourished. No distress.  HENT:  Head: Normocephalic and atraumatic.  Eyes: Conjunctivae are normal. Pupils are equal, round, and reactive to light. No scleral icterus.  Neck: Normal range of motion. Neck supple. No thyromegaly present.  Cardiovascular: Normal rate, regular rhythm, normal heart sounds and intact distal pulses.   Pulmonary/Chest: Effort normal and breath sounds normal. No respiratory distress.  Musculoskeletal: He exhibits no edema.  Lymphadenopathy:    He has no cervical adenopathy.  Neurological: He is alert and oriented to person, place, and time.  Skin: Skin is warm and dry. He is not diaphoretic.  Psychiatric: He has a normal mood and affect. His behavior is normal.      Assessment & Plan:   Type II or unspecified type diabetes mellitus without mention of complication, uncontrolled - new diagnosis, diet reviewed in detail today, diabetic education referral pending, pt checking sugars at home and recorded for my review today - all are around 100 - fantastic. Seems to be doing really well on metformin 1000 bid so recheck in 3 months. Urine microalb mildly elev so will restart arb (lisinopril worsened cough).  At f/u will need referral to optho, monofilament foot exam, and discussion of starting daily asa.  PULMONARY FIBROSIS ILD POST INFLAMMATORY CHRONIC  Other and unspecified hyperlipidemia - start statin as LDL 134, check lfts at f/u.  Essential hypertension, benign - start low-dose losartan. Cont to increase water as still mildly tachycardic though improving.  Cough - from pulmonary fibrosis but was worse when pt was on lisinopril - got much better now that he has been of lisinopril x 2 wks. Will try losartan instead.  Meds ordered this encounter  Medications  . metFORMIN (GLUCOPHAGE) 1000 MG tablet    Sig: Take 1  tablet (1,000 mg total) by mouth 2 (two) times daily with a meal.    Dispense:  180 tablet    Refill:  0    Order Specific Question:  Supervising Provider    Answer:  Eber Hong D [[id number]]  . losartan (COZAAR) 25 MG tablet    Sig: Take 1 tablet (25 mg total) by mouth daily.    Dispense:  90 tablet    Refill:  1    Norberto Sorenson, MD MPH

## 2013-02-05 ENCOUNTER — Other Ambulatory Visit: Payer: Self-pay | Admitting: Family Medicine

## 2013-04-01 ENCOUNTER — Ambulatory Visit: Payer: BC Managed Care – PPO

## 2013-04-08 ENCOUNTER — Ambulatory Visit: Payer: BC Managed Care – PPO

## 2013-04-15 ENCOUNTER — Ambulatory Visit: Payer: BC Managed Care – PPO

## 2013-04-17 ENCOUNTER — Ambulatory Visit (INDEPENDENT_AMBULATORY_CARE_PROVIDER_SITE_OTHER): Payer: BC Managed Care – PPO | Admitting: Family Medicine

## 2013-04-17 VITALS — BP 134/92 | HR 102 | Temp 97.5°F | Resp 20 | Ht 68.5 in | Wt 190.0 lb

## 2013-04-17 DIAGNOSIS — E785 Hyperlipidemia, unspecified: Secondary | ICD-10-CM

## 2013-04-17 DIAGNOSIS — J8410 Pulmonary fibrosis, unspecified: Secondary | ICD-10-CM

## 2013-04-17 DIAGNOSIS — Z79899 Other long term (current) drug therapy: Secondary | ICD-10-CM

## 2013-04-17 DIAGNOSIS — I10 Essential (primary) hypertension: Secondary | ICD-10-CM

## 2013-04-17 DIAGNOSIS — E119 Type 2 diabetes mellitus without complications: Secondary | ICD-10-CM

## 2013-04-17 DIAGNOSIS — E1165 Type 2 diabetes mellitus with hyperglycemia: Secondary | ICD-10-CM

## 2013-04-17 DIAGNOSIS — IMO0001 Reserved for inherently not codable concepts without codable children: Secondary | ICD-10-CM

## 2013-04-17 LAB — POCT GLYCOSYLATED HEMOGLOBIN (HGB A1C): Hemoglobin A1C: 6

## 2013-04-17 LAB — COMPREHENSIVE METABOLIC PANEL
ALT: 14 U/L (ref 0–53)
AST: 17 U/L (ref 0–37)
Albumin: 4 g/dL (ref 3.5–5.2)
Alkaline Phosphatase: 84 U/L (ref 39–117)
BILIRUBIN TOTAL: 0.4 mg/dL (ref 0.3–1.2)
BUN: 20 mg/dL (ref 6–23)
CALCIUM: 9.9 mg/dL (ref 8.4–10.5)
CHLORIDE: 103 meq/L (ref 96–112)
CO2: 26 meq/L (ref 19–32)
Creat: 0.89 mg/dL (ref 0.50–1.35)
Glucose, Bld: 110 mg/dL — ABNORMAL HIGH (ref 70–99)
POTASSIUM: 4.1 meq/L (ref 3.5–5.3)
Sodium: 139 mEq/L (ref 135–145)
Total Protein: 7.2 g/dL (ref 6.0–8.3)

## 2013-04-17 MED ORDER — PANTOPRAZOLE SODIUM 20 MG PO TBEC: 20.0000 mg | DELAYED_RELEASE_TABLET | Freq: Every day | ORAL | Status: DC

## 2013-04-17 MED ORDER — METFORMIN HCL 1000 MG PO TABS: 1000.0000 mg | ORAL_TABLET | Freq: Two times a day (BID) | ORAL | Status: DC

## 2013-04-17 NOTE — Progress Notes (Deleted)
Subjective:    Patient ID: [name], male    DOB: [birth year], 63 y.o.   MRN: [id number]  Chief Complaint  Patient presents with   Follow-up    DM   Medication Refill   This chart was scribed for Shawnee Knapp, MD by Zettie Pho, ED Scribe.   HPI [name] is a 63 y.o. Male with a history of type II diabetes mellitus, benign hypertension, and hyperlipidemia who presents to [facility] for a diabetes follow-up. Patient was diagnosed with Diabetes in November, 2014 (3 months prior), and was started on metformin 1000 mg BID twice daily. At that time, patient's hemoglobin A1c was 12.8. Patient states that his blood glucose usually runs around 100 in the morning and 125 in the early afternoon. He states that he does not check his blood glucose as regularly as he should, but tries to check it 3 times a day around mealtimes, but has not been consistently keeping track of his numbers when he does check. He states his lowest blood glucose measurement was down to 70 one evening and high at 150 at another time. He denies any dizziness, lightheadedness, or diaphoresis secondary to low blood sugar so no hypoglycemic episodes. He denies any issues with his feet.   He states that his vision was worsening about a year ago prior to his diabetes diagnosis and he started wearing glasses, but that his vision has since improved after managing his diabetes and he no longer needs to wear glasses. Patient was last seen by an optometrist about 2 months ago and his exam was normal.  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 He states that his cough has been improving since starting this medication.  He was also started on pravastatin due to an LDL of 134. Patient states that he has also started taking a baby aspirin daily, but has recently run out and plans to get some more soon.  He denies abdominal pain or diarrhea.  Patient reports that he has also run out of his Pantoprazol, which is effective at managing is GERD.   Patient is also requesting some free samples of Viagra.  Past Medical History  Diagnosis Date   Rheumatoid arthritis(714.0) 2005   Pulmonary fibrosis    Hypertension    GERD (gastroesophageal reflux disease)    Diabetes mellitus without complication    Current Outpatient Prescriptions on File Prior to Visit  Medication Sig Dispense Refill   Adalimumab (HUMIRA) 20 MG/0.4ML KIT As directed every 2 wks       celecoxib (CELEBREX) 200 MG capsule Take 200 mg by mouth daily.        losartan (COZAAR) 25 MG tablet Take 1 tablet (25 mg total) by mouth daily.  90 tablet  1   metFORMIN (GLUCOPHAGE) 1000 MG tablet Take 1 tablet (1,000 mg total) by mouth 2 (two) times daily with a meal.  180 tablet  0   pravastatin (PRAVACHOL) 40 MG tablet Take 1 tablet (40 mg total) by mouth at bedtime.  90 tablet  1   VENTOLIN HFA 108 (90 BASE) MCG/ACT inhaler USE 2 PUFFS EVERY 4 TO 6 HOURS AS NEEDED  8.5 each  5   No current facility-administered medications on file prior to visit.   No Known Allergies  Review of Systems  Constitutional: Negative for fever, chills, diaphoresis, appetite change and unexpected  weight change.  Respiratory: Positive for cough (improving).   Gastrointestinal: Negative for vomiting, abdominal pain, diarrhea and constipation.  Musculoskeletal: Negative for arthralgias, gait problem and joint swelling.  Skin: Negative for color change, rash and wound.  Neurological: Negative for dizziness, seizures, light-headedness and numbness.      Vitals: BP 134/92   Pulse 102   Temp(Src) 97.5 F (36.4 C)   Resp 20   Ht 5' 8.5" (1.74 m)   Wt 190 lb (86.183 kg)   BMI 28.47 kg/m2   SpO2 91% Objective:   Physical Exam  Nursing note and vitals reviewed. Constitutional: He is oriented to person, place, and time. He appears well-developed and  well-nourished. No distress.  HENT:  Head: Normocephalic and atraumatic.  Eyes: Conjunctivae are normal.  Neck: Normal range of motion. Neck supple. No thyromegaly present.  Cardiovascular: Regular rhythm and normal heart sounds.  Tachycardia present.   No murmur heard. Pulmonary/Chest: Effort normal. No respiratory distress. He has wheezes in the right lower field and the left lower field. He has rales.  Diffuse inspiratory and expiratory rales, heard best in right upper lobe. Bibasilar wheezes.  Abdominal: He exhibits no distension.  Musculoskeletal: Normal range of motion.  Lymphadenopathy:    He has no cervical adenopathy.  Neurological: He is alert and oriented to person, place, and time.  Skin: Skin is warm and dry.  Psychiatric: He has a normal mood and affect. His behavior is normal.      Results for orders placed in visit on 04/17/13  POCT GLYCOSYLATED HEMOGLOBIN (HGB A1C)      Result Value Range   Hemoglobin A1C 6.0     Assessment & Plan:  8:33 AM-  Type II or unspecified type diabetes mellitus without mention of complication, not stated as uncontrolled - Plan: [facility] Diabetes Foot Exam, POCT glycosylated hemoglobin (Hb A1C) - MUCH improved from last hgba1c at time of treatment of >12.  Continue on metformin, cont to work on diet and exercise. Could consider decreasing dose at f/u in 4 mos if a1c is even lower. Cont asa 2m qd.  PULMONARY FIBROSIS ILD POST INFLAMMATORY CHRONIC  Other and unspecified hyperlipidemia  Essential hypertension, benign  Encounter for long-term (current) use of other medications - Plan: Comprehensive metabolic panel  ED - pt wanted samples or coupons for viagra, cialis, or other. None avail, pt declined rx due to cost, encouraged pt to ask again at f/u.  Meds ordered this encounter  Medications   pantoprazole (PROTONIX) 20 MG tablet    Sig: Take 1 tablet (20 mg total) by mouth daily.    Dispense:  60 tablet    Refill:  3   metFORMIN  (GLUCOPHAGE) 1000 MG tablet    Sig: Take 1 tablet (1,000 mg total) by mouth 2 (two) times daily with a meal.    Dispense:  180 tablet    Refill:  1    Order Specific Question:  Supervising Provider    Answer:  MNoemi ChapelD [[id number]]   aspirin 81 MG tablet    Sig: Take 81 mg by mouth daily.   Discussed treatment plan with patient at bedside and patient verbalized agreement.   I personally performed the services described in this documentation, which was scribed in my presence. The recorded information has been reviewed and considered, and addended by me as needed.  EDelman Cheadle MD MPH

## 2013-04-17 NOTE — Patient Instructions (Signed)
Diabetes and Standards of Medical Care  Diabetes is complicated. You may find that your diabetes team includes a dietitian, nurse, diabetes educator, eye doctor, and more. To help everyone know what is going on and to help you get the care you deserve, the following schedule of care was developed to help keep you on track. Below are the tests, exams, vaccines, medicines, education, and plans you will need. HbA1c test This test shows how well you have controlled your glucose over the past 2 3 months. It is used to see if your diabetes management plan needs to be adjusted.   It is performed at least 2 times a year if you are meeting treatment goals.  It is performed 4 times a year if therapy has changed or if you are not meeting treatment goals. Blood pressure test  This test is performed at every routine medical visit. The goal is less than 140/90 mmHg for most people, but 130/80 mmHg in some cases. Ask your health care provider about your goal. Dental exam  Follow up with the dentist regularly. Eye exam  If you are diagnosed with type 1 diabetes as a child, get an exam upon reaching the age of 10 years or older and have had diabetes for 3 5 years. Yearly eye exams are recommended after that initial eye exam.  If you are diagnosed with type 1 diabetes as an adult, get an exam within 5 years of diagnosis and then yearly.  If you are diagnosed with type 2 diabetes, get an exam as soon as possible after the diagnosis and then yearly. Foot care exam  Visual foot exams are performed at every routine medical visit. The exams check for cuts, injuries, or other problems with the feet.  A comprehensive foot exam should be done yearly. This includes visual inspection as well as assessing foot pulses and testing for loss of sensation.  Check your feet nightly for cuts, injuries, or other problems with your feet. Tell your health care provider if anything is not healing. Kidney function test (urine  microalbumin)  This test is performed once a year.  Type 1 diabetes: The first test is performed 5 years after diagnosis.  Type 2 diabetes: The first test is performed at the time of diagnosis.  A serum creatinine and estimated glomerular filtration rate (eGFR) test is done once a year to assess the level of chronic kidney disease (CKD), if present. Lipid profile (cholesterol, HDL, LDL, triglycerides)  Performed every 5 years for most people.  The goal for LDL is less than 100 mg/dL. If you are at high risk, the goal is less than 70 mg/dL.  The goal for HDL is 40 mg/dL 50 mg/dL for men and 50 mg/dL 60 mg/dL for women. An HDL cholesterol of 60 mg/dL or higher gives some protection against heart disease.  The goal for triglycerides is less than 150 mg/dL. Influenza vaccine, pneumococcal vaccine, and hepatitis B vaccine  The influenza vaccine is recommended yearly.  The pneumococcal vaccine is generally given once in a lifetime. However, there are some instances when another vaccination is recommended. Check with your health care provider.  The hepatitis B vaccine is also recommended for adults with diabetes. Diabetes self-management education  Education is recommended at diagnosis and ongoing as needed. Treatment plan  Your treatment plan is reviewed at every medical visit. Document Released: 01/07/2009 Document Revised: 11/12/2012 Document Reviewed: 08/12/2012 Exit[employer] Patient Information 2014 [employer].  Diabetes Meal Planning Guide The diabetes meal planning   guide is a tool to help you plan your meals and snacks. It is important for people with diabetes to manage their blood glucose (sugar) levels. Choosing the right foods and the right amounts throughout your day will help control your blood glucose. Eating right can even help you improve your blood pressure and reach or maintain a healthy weight. CARBOHYDRATE COUNTING MADE EASY When you eat carbohydrates, they turn to  sugar. This raises your blood glucose level. Counting carbohydrates can help you control this level so you feel better. When you plan your meals by counting carbohydrates, you can have more flexibility in what you eat and balance your medicine with your food intake. Carbohydrate counting simply means adding up the total amount of carbohydrate grams in your meals and snacks. Try to eat about the same amount at each meal. Foods with carbohydrates are listed below. Each portion below is 1 carbohydrate serving or 15 grams of carbohydrates. Ask your dietician how many grams of carbohydrates you should eat at each meal or snack. Grains and Starches  1 slice bread.   English muffin or hotdog/hamburger bun.   cup cold cereal (unsweetened).   cup cooked pasta or rice.   cup starchy vegetables (corn, potatoes, peas, beans, winter squash).  1 tortilla (6 inches).   bagel.  1 waffle or pancake (size of a CD).   cup cooked cereal.  4 to 6 small crackers. *Whole grain is recommended. Fruit  1 cup fresh unsweetened berries, melon, papaya, pineapple.  1 small fresh fruit.   banana or mango.   cup fruit juice (4 oz unsweetened).   cup canned fruit in natural juice or water.  2 tbs dried fruit.  12 to 15 grapes or cherries. Milk and Yogurt  1 cup fat-free or 1% milk.  1 cup soy milk.  6 oz light yogurt with sugar-free sweetener.  6 oz low-fat soy yogurt.  6 oz plain yogurt. Vegetables  1 cup raw or  cup cooked is counted as 0 carbohydrates or a "free" food.  If you eat 3 or more servings at 1 meal, count them as 1 carbohydrate serving. Other Carbohydrates   oz chips or pretzels.   cup ice cream or frozen yogurt.   cup sherbet or sorbet.  2 inch square cake, no frosting.  1 tbs honey, sugar, jam, jelly, or syrup.  2 small cookies.  3 squares of graham crackers.  3 cups popcorn.  6 crackers.  1 cup broth-based soup.  Count 1 cup casserole or other  mixed foods as 2 carbohydrate servings.  Foods with less than 20 calories in a serving may be counted as 0 carbohydrates or a "free" food. You may want to purchase a book or computer software that lists the carbohydrate gram counts of different foods. In addition, the nutrition facts panel on the labels of the foods you eat are a good source of this information. The label will tell you how big the serving size is and the total number of carbohydrate grams you will be eating per serving. Divide this number by 15 to obtain the number of carbohydrate servings in a portion. Remember, 1 carbohydrate serving equals 15 grams of carbohydrate. SERVING SIZES Measuring foods and serving sizes helps you make sure you are getting the right amount of food. The list below tells how big or small some common serving sizes are.  1 oz.........4 stacked dice.  3 oz.........Deck of cards.  1 tsp........Tip of little finger.  1 tbs........Thumb.    2 tbs........Golf ball.   cup.......Half of a fist.  1 cup........A fist. SAMPLE DIABETES MEAL PLAN Below is a sample meal plan that includes foods from the grain and starches, dairy, vegetable, fruit, and meat groups. A dietician can individualize a meal plan to fit your calorie needs and tell you the number of servings needed from each food group. However, controlling the total amount of carbohydrates in your meal or snack is more important than making sure you include all of the food groups at every meal. You may interchange carbohydrate containing foods (dairy, starches, and fruits). The meal plan below is an example of a 2000 calorie diet using carbohydrate counting. This meal plan has 17 carbohydrate servings. Breakfast  1 cup oatmeal (2 carb servings).   cup light yogurt (1 carb serving).  1 cup blueberries (1 carb serving).   cup almonds. Snack  1 large apple (2 carb servings).  1 low-fat string cheese stick. Lunch  Chicken breast salad.  1 cup  spinach.   cup chopped tomatoes.  2 oz chicken breast, sliced.  2 tbs low-fat Italian dressing.  12 whole-wheat crackers (2 carb servings).  12 to 15 grapes (1 carb serving).  1 cup low-fat milk (1 carb serving). Snack  1 cup carrots.   cup hummus (1 carb serving). Dinner  3 oz broiled salmon.  1 cup brown rice (3 carb servings). Snack  1  cups steamed broccoli (1 carb serving) drizzled with 1 tsp olive oil and lemon juice.  1 cup light pudding (2 carb servings). DIABETES MEAL PLANNING WORKSHEET Your dietician can use this worksheet to help you decide how many servings of foods and what types of foods are right for you.  BREAKFAST Food Group and Servings / Carb Servings Grain/Starches __________________________________ Dairy __________________________________________ Vegetable ______________________________________ Fruit ___________________________________________ Meat __________________________________________ Fat ____________________________________________ LUNCH Food Group and Servings / Carb Servings Grain/Starches ___________________________________ Dairy ___________________________________________ Fruit ____________________________________________ Meat ___________________________________________ Fat _____________________________________________ DINNER Food Group and Servings / Carb Servings Grain/Starches ___________________________________ Dairy ___________________________________________ Fruit ____________________________________________ Meat ___________________________________________ Fat _____________________________________________ SNACKS Food Group and Servings / Carb Servings Grain/Starches ___________________________________ Dairy ___________________________________________ Vegetable _______________________________________ Fruit ____________________________________________ Meat ___________________________________________ Fat  _____________________________________________ DAILY TOTALS Starches _________________________ Vegetable ________________________ Fruit ____________________________ Dairy ____________________________ Meat ____________________________ Fat ______________________________ Document Released: 12/07/2004 Document Revised: 06/04/2011 Document Reviewed: 10/18/2008 Exit[employer] Patient Information 2014 [employer].  

## 2013-04-19 ENCOUNTER — Encounter: Payer: Self-pay | Admitting: Family Medicine

## 2013-04-19 NOTE — Progress Notes (Signed)
Subjective:    Patient ID: [name], male    DOB: 01/21/1951, 63 y.o.   MRN: [id number]  Chief Complaint  Patient presents with  . Follow-up    DM  . Medication Refill   This chart was scribed for Riley Knapp, MD by Zettie Pho, ED Scribe.   HPI [name] is a 63 y.o. Male with a history of type II diabetes mellitus, benign hypertension, and hyperlipidemia who presents to [facility] for a diabetes follow-up. Patient was diagnosed with Diabetes in November, 2014 (3 months prior), and was started on metformin 1000 mg BID twice daily. At that time, patient's hemoglobin A1c was 12.8. Patient states that his blood glucose usually runs around 100 in the morning and 125 in the early afternoon. He states that he does not check his blood glucose as regularly as he should, but tries to check it 3 times a day around mealtimes, but has not been consistently keeping track of his numbers when he does check. He states his lowest blood glucose measurement was down to 70 one evening and high at 150 at another time. He denies any dizziness, lightheadedness, or diaphoresis secondary to low blood sugar so no hypoglycemic episodes. He denies any issues with his feet.   He states that his vision was worsening about a year ago prior to his diabetes diagnosis and he started wearing glasses, but that his vision has since improved after managing his diabetes and he no longer needs to wear glasses. Patient was last seen by an optometrist about 2 months ago and his exam was normal.  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 He states that his cough has been improving since starting this medication.  He was also started on pravastatin due to an LDL of 134. Patient states that he has also started taking a baby aspirin daily, but has recently run out and plans to get some more soon.  He denies abdominal pain or diarrhea.  Patient reports that he has also run out of his Pantoprazol, which is effective at managing is GERD.   Patient is also requesting some free samples of Viagra.  Past Medical History  Diagnosis Date  . Rheumatoid arthritis(714.0) 2005  . Pulmonary fibrosis   . Hypertension   . GERD (gastroesophageal reflux disease)   . Diabetes mellitus without complication    Current Outpatient Prescriptions on File Prior to Visit  Medication Sig Dispense Refill  . Adalimumab (HUMIRA) 20 MG/0.4ML KIT As directed every 2 wks      . celecoxib (CELEBREX) 200 MG capsule Take 200 mg by mouth daily.       Marland Kitchen losartan (COZAAR) 25 MG tablet Take 1 tablet (25 mg total) by mouth daily.  90 tablet  1  . pravastatin (PRAVACHOL) 40 MG tablet Take 1 tablet (40 mg total) by mouth at bedtime.  90 tablet  1  . VENTOLIN HFA 108 (90 BASE) MCG/ACT inhaler USE 2 PUFFS EVERY 4 TO 6 HOURS AS NEEDED  8.5 each  5   No current facility-administered medications on file prior to visit.   No Known Allergies  Review of Systems  Constitutional: Negative for fever, chills, diaphoresis, appetite change and unexpected weight change.  Respiratory: Positive for cough (improving).   Gastrointestinal: Negative for vomiting, abdominal pain, diarrhea and constipation.  Musculoskeletal: Negative for arthralgias, gait problem and  joint swelling.  Skin: Negative for color change, rash and wound.  Neurological: Negative for dizziness, seizures, light-headedness and numbness.      Vitals: BP 134/92  Pulse 102  Temp(Src) 97.5 F (36.4 C)  Resp 20  Ht 5' 8.5" (1.74 m)  Wt 190 lb (86.183 kg)  BMI 28.47 kg/m2  SpO2 91% Objective:   Physical Exam  Nursing note and vitals reviewed. Constitutional: He is oriented to person, place, and time. He appears well-developed and well-nourished. No distress.  HENT:  Head: Normocephalic and atraumatic.  Eyes: Conjunctivae are normal.  Neck: Normal range of  motion. Neck supple. No thyromegaly present.  Cardiovascular: Regular rhythm and normal heart sounds.  Tachycardia present.   No murmur heard. Pulmonary/Chest: Effort normal. No respiratory distress. He has wheezes in the right lower field and the left lower field. He has rales.  Diffuse inspiratory and expiratory rales, heard best in right upper lobe. Bibasilar wheezes.  Abdominal: He exhibits no distension.  Musculoskeletal: Normal range of motion.  Lymphadenopathy:    He has no cervical adenopathy.  Neurological: He is alert and oriented to person, place, and time.  Skin: Skin is warm and dry.  Psychiatric: He has a normal mood and affect. His behavior is normal.      Results for orders placed in visit on 04/17/13  COMPREHENSIVE METABOLIC PANEL      Result Value Range   Sodium 139  135 - 145 mEq/L   Potassium 4.1  3.5 - 5.3 mEq/L   Chloride 103  96 - 112 mEq/L   CO2 26  19 - 32 mEq/L   Glucose, Bld 110 (*) 70 - 99 mg/dL   BUN 20  6 - 23 mg/dL   Creat 0.89  0.50 - 1.35 mg/dL   Total Bilirubin 0.4  0.3 - 1.2 mg/dL   Alkaline Phosphatase 84  39 - 117 U/L   AST 17  0 - 37 U/L   ALT 14  0 - 53 U/L   Total Protein 7.2  6.0 - 8.3 g/dL   Albumin 4.0  3.5 - 5.2 g/dL   Calcium 9.9  8.4 - 10.5 mg/dL  POCT GLYCOSYLATED HEMOGLOBIN (HGB A1C)      Result Value Range   Hemoglobin A1C 6.0     Assessment & Plan:  8:33 AM-  Type II or unspecified type diabetes mellitus without mention of complication, not stated as uncontrolled - Plan: [facility] Diabetes Foot Exam, POCT glycosylated hemoglobin (Hb A1C) - MUCH improved from last hgba1c at time of treatment of >12.  Continue on metformin, cont to work on diet and exercise. Could consider decreasing dose at f/u in 4 mos if a1c is even lower. Cont asa 61m qd.  PULMONARY FIBROSIS ILD POST INFLAMMATORY CHRONIC  Other and unspecified hyperlipidemia  Essential hypertension, benign  Encounter for long-term (current) use of other medications - Plan:  Comprehensive metabolic panel  ED - pt wanted samples or coupons for viagra, cialis, or other. None avail, pt declined rx due to cost, encouraged pt to ask again at f/u.  Meds ordered this encounter  Medications  . pantoprazole (PROTONIX) 20 MG tablet    Sig: Take 1 tablet (20 mg total) by mouth daily.    Dispense:  60 tablet    Refill:  3  . metFORMIN (GLUCOPHAGE) 1000 MG tablet    Sig: Take 1 tablet (1,000 mg total) by mouth 2 (two) times daily with a meal.    Dispense:  180 tablet  Refill:  1    Order Specific Question:  Supervising Provider    Answer:  [name] D [[id number]]  . aspirin 81 MG tablet    Sig: Take 81 mg by mouth daily.   Discussed treatment plan with patient at bedside and patient verbalized agreement.   I personally performed the services described in this documentation, which was scribed in my presence. The recorded information has been reviewed and considered, and addended by me as needed.  Riley Cheadle, MD MPH

## 2013-06-26 ENCOUNTER — Ambulatory Visit (INDEPENDENT_AMBULATORY_CARE_PROVIDER_SITE_OTHER): Payer: BC Managed Care – PPO | Admitting: Physician Assistant

## 2013-06-26 VITALS — BP 122/70 | HR 116 | Temp 98.0°F | Resp 18 | Ht 69.0 in | Wt 199.0 lb

## 2013-06-26 DIAGNOSIS — B029 Zoster without complications: Secondary | ICD-10-CM

## 2013-06-26 DIAGNOSIS — M069 Rheumatoid arthritis, unspecified: Secondary | ICD-10-CM | POA: Insufficient documentation

## 2013-06-26 MED ORDER — VALACYCLOVIR HCL 1 G PO TABS: 1.0000 g | ORAL_TABLET | Freq: Three times a day (TID) | ORAL | Status: DC

## 2013-06-26 MED ORDER — HYDROCODONE-ACETAMINOPHEN 5-325 MG PO TABS: 1.0000 | ORAL_TABLET | Freq: Four times a day (QID) | ORAL | Status: DC | PRN

## 2013-06-26 MED ORDER — GABAPENTIN 300 MG PO CAPS: ORAL_CAPSULE | ORAL | Status: DC

## 2013-06-26 NOTE — Patient Instructions (Signed)
Recheck in 2 weeks if you are still having pain.  Shingles Shingles (herpes zoster) is an infection that is caused by the same virus that causes chickenpox (varicella). The infection causes a painful skin rash and fluid-filled blisters, which eventually break open, crust over, and heal. It may occur in any area of the body, but it usually affects only one side of the body or face. The pain of shingles usually lasts about 1 month. However, some people with shingles may develop long-term (chronic) pain in the affected area of the body. Shingles often occurs many years after the person had chickenpox. It is more common:  In people older than 50 years.  In people with weakened immune systems, such as those with HIV, AIDS, or cancer.  In people taking medicines that weaken the immune system, such as transplant medicines.  In people under great stress. CAUSES  Shingles is caused by the varicella zoster virus (VZV), which also causes chickenpox. After a person is infected with the virus, it can remain in the person's body for years in an inactive state (dormant). To cause shingles, the virus reactivates and breaks out as an infection in a nerve root. The virus can be spread from person to person (contagious) through contact with open blisters of the shingles rash. It will only spread to people who have not had chickenpox. When these people are exposed to the virus, they may develop chickenpox. They will not develop shingles. Once the blisters scab over, the person is no longer contagious and cannot spread the virus to others. SYMPTOMS  Shingles shows up in stages. The initial symptoms may be pain, itching, and tingling in an area of the skin. This pain is usually described as burning, stabbing, or throbbing.In a few days or weeks, a painful red rash will appear in the area where the pain, itching, and tingling were felt. The rash is usually on one side of the body in a band or belt-like pattern. Then, the  rash usually turns into fluid-filled blisters. They will scab over and dry up in approximately 2 3 weeks. Flu-like symptoms may also occur with the initial symptoms, the rash, or the blisters. These may include:  Fever.  Chills.  Headache.  Upset stomach. DIAGNOSIS  Your caregiver will perform a skin exam to diagnose shingles. Skin scrapings or fluid samples may also be taken from the blisters. This sample will be examined under a microscope or sent to a lab for further testing. TREATMENT  There is no specific cure for shingles. Your caregiver will likely prescribe medicines to help you manage the pain, recover faster, and avoid long-term problems. This may include antiviral drugs, anti-inflammatory drugs, and pain medicines. HOME CARE INSTRUCTIONS   Take a cool bath or apply cool compresses to the area of the rash or blisters as directed. This may help with the pain and itching.   Only take over-the-counter or prescription medicines as directed by your caregiver.   Rest as directed by your caregiver.  Keep your rash and blisters clean with mild soap and cool water or as directed by your caregiver.  Do not pick your blisters or scratch your rash. Apply an anti-itch cream or numbing creams to the affected area as directed by your caregiver.  Keep your shingles rash covered with a loose bandage (dressing).  Avoid skin contact with:  Babies.   Pregnant women.   Children with eczema.   Elderly people with transplants.   People with chronic illnesses, such as  leukemia or AIDS.   Wear loose-fitting clothing to help ease the pain of material rubbing against the rash.  Keep all follow-up appointments with your caregiver.If the area involved is on your face, you may receive a referral for follow-up to a specialist, such as an eye doctor (ophthalmologist) or an ear, nose, and throat (ENT) doctor. Keeping all follow-up appointments will help you avoid eye complications,  chronic pain, or disability.  SEEK IMMEDIATE MEDICAL CARE IF:   You have facial pain, pain around the eye area, or loss of feeling on one side of your face.  You have ear pain or ringing in your ear.  You have loss of taste.  Your pain is not relieved with prescribed medicines.   Your redness or swelling spreads.   You have more pain and swelling.  Your condition is worsening or has changed.   You have a feveror persistent symptoms for more than 2 3 days.  You have a fever and your symptoms suddenly get worse. MAKE SURE YOU:  Understand these instructions.  Will watch your condition.  Will get help right away if you are not doing well or get worse. Document Released: 03/12/2005 Document Revised: 12/05/2011 Document Reviewed: 10/25/2011 [facility] Patient Information 2014 [place].

## 2013-06-26 NOTE — Progress Notes (Signed)
   Subjective:    Patient ID: [name], male    DOB: 1951-03-09, 63 y.o.   MRN: [id number]  HPI Pt presents to clinic with 2 day h/o rash and pain on his left side - he has never had a rash like this before.  The pain is about a 5/10 and hurts to have a shirt touch his skin.  Review of Systems  Skin: Positive for rash.       Objective:   Physical Exam  Vitals reviewed. Constitutional: He is oriented to person, place, and time. He appears well-developed and well-nourished.  HENT:  Head: Normocephalic and atraumatic.  Right Ear: External ear normal.  Left Ear: External ear normal.  Pulmonary/Chest: Effort normal.  Neurological: He is alert and oriented to person, place, and time.  Skin: Skin is warm and dry. Rash noted. Rash is papular and vesicular (in the T 12 dermatome only on the L side - rash wraps anterior and posterior of the dermatome). There is erythema.  Psychiatric: He has a normal mood and affect. His behavior is normal. Judgment and thought content normal.       Assessment & Plan:  Shingles - Plan: valACYclovir (VALTREX) 1000 MG tablet, HYDROcodone-acetaminophen (NORCO/VICODIN) 5-325 MG per tablet, gabapentin (NEURONTIN) 300 MG capsule  Pt has classic shingles and due to his pain level we will start neurontin today and give him a 2 week supply - if he is still having he pain he will return to clinic and we will titrate up the neurontin if he is tolerating. He is unable to have the shingles vaccine due to his humira for his RA.  Windell Hummingbird PA-C  [facility] 06/26/2013 9:05 AM

## 2013-07-10 ENCOUNTER — Ambulatory Visit (INDEPENDENT_AMBULATORY_CARE_PROVIDER_SITE_OTHER): Payer: BC Managed Care – PPO | Admitting: Family Medicine

## 2013-07-10 ENCOUNTER — Encounter: Payer: Self-pay | Admitting: Family Medicine

## 2013-07-10 VITALS — BP 134/72 | HR 116 | Temp 98.1°F | Resp 20 | Ht 68.5 in | Wt 198.0 lb

## 2013-07-10 DIAGNOSIS — D839 Common variable immunodeficiency, unspecified: Secondary | ICD-10-CM

## 2013-07-10 DIAGNOSIS — B029 Zoster without complications: Secondary | ICD-10-CM

## 2013-07-10 MED ORDER — GABAPENTIN 300 MG PO CAPS: ORAL_CAPSULE | ORAL | Status: DC

## 2013-07-10 MED ORDER — HYDROCODONE-ACETAMINOPHEN 5-325 MG PO TABS: 1.0000 | ORAL_TABLET | Freq: Four times a day (QID) | ORAL | Status: DC | PRN

## 2013-07-10 NOTE — Progress Notes (Signed)
   Subjective:    Patient ID: [name], male    DOB: July 08, 1950, 63 y.o.   MRN: [id number]  HPI This chart was scribed for Synetta Shadow Lauenstein-MD, by Lovena Le Hjalmar Ballengee, Scribe. This patient was seen in room 3 and the patient's care was started at 8:45 AM.  HPI Comments: [name] is a 63 y.o. male who presents to the [facility] for follow up of his left sided shingles and reports that he is still having pain. For this problem, he was started on pain medicine and valtrex. He states that the pain keeps him from sleeping well at night.   Past Medical History  Diagnosis Date  . Rheumatoid arthritis(714.0) 2005  . Pulmonary fibrosis   . Hypertension   . GERD (gastroesophageal reflux disease)   . Diabetes mellitus without complication     No Known Allergies  No orders of the defined types were placed in this encounter.    Review of Systems  Constitutional: Negative for fever and chills.  Respiratory: Negative for cough and shortness of breath.   Cardiovascular: Negative for chest pain.  Gastrointestinal: Negative for abdominal pain.  Musculoskeletal: Negative for back pain.  Skin: Positive for rash.      Objective:   Physical Exam Nursing note and vitals reviewed. Constitutional: Patient is oriented to person, place, and time. Patient appears well-developed and well-nourished. No distress.  HENT:  Head: Normocephalic and atraumatic.  Neck: Neck supple. No tracheal deviation present.  Cardiovascular: Normal rate, regular rhythm and normal heart sounds.   No murmur heard. Pulmonary/Chest: Effort normal and breath sounds normal. No respiratory distress. Patient has no wheezes. Patient has no rales.  Musculoskeletal: Normal range of motion.  Neurological: Patient is alert and oriented to person, place, and time.  Skin: Skin is warm and dry. Healing left chest shingles rash with mild tenderness. Psychiatric: Patient has a normal mood and affect. Patient's  behavior is normal.   Triage Vitals: BP 134/72  Pulse 116  Temp(Src) 98.1 F (36.7 C) (Oral)  Resp 20  Ht 5' 8.5" (1.74 m)  Wt 198 lb (89.812 kg)  BMI 29.66 kg/m2  SpO2 93%     Assessment & Plan:  DIAGNOSTIC STUDIES: Oxygen Saturation is 93% on room air, adequate by my interpretation.    COORDINATION OF CARE: At 845 AM Discussed treatment plan with patient. Patient agrees.   I personally performed the services described in this documentation, which was scribed in my presence. The recorded information has been reviewed and is accurate.    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

## 2013-07-10 NOTE — Patient Instructions (Signed)
Shingles Shingles (herpes zoster) is an infection that is caused by the same virus that causes chickenpox (varicella). The infection causes a painful skin rash and fluid-filled blisters, which eventually break open, crust over, and heal. It may occur in any area of the body, but it usually affects only one side of the body or face. The pain of shingles usually lasts about 1 month. However, some people with shingles may develop long-term (chronic) pain in the affected area of the body. Shingles often occurs many years after the person had chickenpox. It is more common:  In people older than 50 years.  In people with weakened immune systems, such as those with HIV, AIDS, or cancer.  In people taking medicines that weaken the immune system, such as transplant medicines.  In people under great stress. CAUSES  Shingles is caused by the varicella zoster virus (VZV), which also causes chickenpox. After a person is infected with the virus, it can remain in the person's body for years in an inactive state (dormant). To cause shingles, the virus reactivates and breaks out as an infection in a nerve root. The virus can be spread from person to person (contagious) through contact with open blisters of the shingles rash. It will only spread to people who have not had chickenpox. When these people are exposed to the virus, they may develop chickenpox. They will not develop shingles. Once the blisters scab over, the person is no longer contagious and cannot spread the virus to others. SYMPTOMS  Shingles shows up in stages. The initial symptoms may be pain, itching, and tingling in an area of the skin. This pain is usually described as burning, stabbing, or throbbing.In a few days or weeks, a painful red rash will appear in the area where the pain, itching, and tingling were felt. The rash is usually on one side of the body in a band or belt-like pattern. Then, the rash usually turns into fluid-filled blisters. They  will scab over and dry up in approximately 2 3 weeks. Flu-like symptoms may also occur with the initial symptoms, the rash, or the blisters. These may include:  Fever.  Chills.  Headache.  Upset stomach. DIAGNOSIS  Your caregiver will perform a skin exam to diagnose shingles. Skin scrapings or fluid samples may also be taken from the blisters. This sample will be examined under a microscope or sent to a lab for further testing. TREATMENT  There is no specific cure for shingles. Your caregiver will likely prescribe medicines to help you manage the pain, recover faster, and avoid long-term problems. This may include antiviral drugs, anti-inflammatory drugs, and pain medicines. HOME CARE INSTRUCTIONS   Take a cool bath or apply cool compresses to the area of the rash or blisters as directed. This may help with the pain and itching.   Only take over-the-counter or prescription medicines as directed by your caregiver.   Rest as directed by your caregiver.  Keep your rash and blisters clean with mild soap and cool water or as directed by your caregiver.  Do not pick your blisters or scratch your rash. Apply an anti-itch cream or numbing creams to the affected area as directed by your caregiver.  Keep your shingles rash covered with a loose bandage (dressing).  Avoid skin contact with:  Babies.   Pregnant women.   Children with eczema.   Elderly people with transplants.   People with chronic illnesses, such as leukemia or AIDS.   Wear loose-fitting clothing to help ease   the pain of material rubbing against the rash.  Keep all follow-up appointments with your caregiver.If the area involved is on your face, you may receive a referral for follow-up to a specialist, such as an eye doctor (ophthalmologist) or an ear, nose, and throat (ENT) doctor. Keeping all follow-up appointments will help you avoid eye complications, chronic pain, or disability.  SEEK IMMEDIATE MEDICAL  CARE IF:   You have facial pain, pain around the eye area, or loss of feeling on one side of your face.  You have ear pain or ringing in your ear.  You have loss of taste.  Your pain is not relieved with prescribed medicines.   Your redness or swelling spreads.   You have more pain and swelling.  Your condition is worsening or has changed.   You have a feveror persistent symptoms for more than 2 3 days.  You have a fever and your symptoms suddenly get worse. MAKE SURE YOU:  Understand these instructions.  Will watch your condition.  Will get help right away if you are not doing well or get worse. Document Released: 03/12/2005 Document Revised: 12/05/2011 Document Reviewed: 10/25/2011 [employer] Patient Information 2014 [employer].  

## 2013-07-25 ENCOUNTER — Ambulatory Visit (INDEPENDENT_AMBULATORY_CARE_PROVIDER_SITE_OTHER): Payer: BC Managed Care – PPO | Admitting: Family Medicine

## 2013-07-25 VITALS — BP 158/96 | HR 116 | Temp 97.7°F | Resp 24 | Ht 68.0 in | Wt 198.0 lb

## 2013-07-25 DIAGNOSIS — B029 Zoster without complications: Secondary | ICD-10-CM

## 2013-07-25 MED ORDER — OXYCODONE-ACETAMINOPHEN 7.5-325 MG PO TABS: 1.0000 | ORAL_TABLET | ORAL | Status: DC | PRN

## 2013-07-25 NOTE — Progress Notes (Signed)
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 The pain is still quite intense and he feels that whenever he touches the side, coughs, or sneezes.  Objective: Healing skin along the 10th thoracic left dermatome. This is very sensitive to touch.  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 Aref return if pain continues more than 3-4 weeks  Signed, Carola Frost.D.

## 2013-07-25 NOTE — Patient Instructions (Signed)
Take the gabapentin three times a day.   Shingles Shingles (herpes zoster) is an infection that is caused by the same virus that causes chickenpox (varicella). The infection causes a painful skin rash and fluid-filled blisters, which eventually break open, crust over, and heal. It may occur in any area of the body, but it usually affects only one side of the body or face. The pain of shingles usually lasts about 1 month. However, some people with shingles may develop long-term (chronic) pain in the affected area of the body. Shingles often occurs many years after the person had chickenpox. It is more common:  In people older than 50 years.  In people with weakened immune systems, such as those with HIV, AIDS, or cancer.  In people taking medicines that weaken the immune system, such as transplant medicines.  In people under great stress. CAUSES  Shingles is caused by the varicella zoster virus (VZV), which also causes chickenpox. After a person is infected with the virus, it can remain in the person's body for years in an inactive state (dormant). To cause shingles, the virus reactivates and breaks out as an infection in a nerve root. The virus can be spread from person to person (contagious) through contact with open blisters of the shingles rash. It will only spread to people who have not had chickenpox. When these people are exposed to the virus, they may develop chickenpox. They will not develop shingles. Once the blisters scab over, the person is no longer contagious and cannot spread the virus to others. SYMPTOMS  Shingles shows up in stages. The initial symptoms may be pain, itching, and tingling in an area of the skin. This pain is usually described as burning, stabbing, or throbbing.In a few days or weeks, a painful red rash will appear in the area where the pain, itching, and tingling were felt. The rash is usually on one side of the body in a band or belt-like pattern. Then, the rash  usually turns into fluid-filled blisters. They will scab over and dry up in approximately 2 3 weeks. Flu-like symptoms may also occur with the initial symptoms, the rash, or the blisters. These may include:  Fever.  Chills.  Headache.  Upset stomach. DIAGNOSIS  Your caregiver will perform a skin exam to diagnose shingles. Skin scrapings or fluid samples may also be taken from the blisters. This sample will be examined under a microscope or sent to a lab for further testing. TREATMENT  There is no specific cure for shingles. Your caregiver will likely prescribe medicines to help you manage the pain, recover faster, and avoid long-term problems. This may include antiviral drugs, anti-inflammatory drugs, and pain medicines. HOME CARE INSTRUCTIONS   Take a cool bath or apply cool compresses to the area of the rash or blisters as directed. This may help with the pain and itching.   Only take over-the-counter or prescription medicines as directed by your caregiver.   Rest as directed by your caregiver.  Keep your rash and blisters clean with mild soap and cool water or as directed by your caregiver.  Do not pick your blisters or scratch your rash. Apply an anti-itch cream or numbing creams to the affected area as directed by your caregiver.  Keep your shingles rash covered with a loose bandage (dressing).  Avoid skin contact with:  Babies.   Pregnant women.   Children with eczema.   Elderly people with transplants.   People with chronic illnesses, such as leukemia or  AIDS.   Wear loose-fitting clothing to help ease the pain of material rubbing against the rash.  Keep all follow-up appointments with your caregiver.If the area involved is on your face, you may receive a referral for follow-up to a specialist, such as an eye doctor (ophthalmologist) or an ear, nose, and throat (ENT) doctor. Keeping all follow-up appointments will help you avoid eye complications, chronic  pain, or disability.  SEEK IMMEDIATE MEDICAL CARE IF:   You have facial pain, pain around the eye area, or loss of feeling on one side of your face.  You have ear pain or ringing in your ear.  You have loss of taste.  Your pain is not relieved with prescribed medicines.   Your redness or swelling spreads.   You have more pain and swelling.  Your condition is worsening or has changed.   You have a feveror persistent symptoms for more than 2 3 days.  You have a fever and your symptoms suddenly get worse. MAKE SURE YOU:  Understand these instructions.  Will watch your condition.  Will get help right away if you are not doing well or get worse. Document Released: 03/12/2005 Document Revised: 12/05/2011 Document Reviewed: 10/25/2011 [facility] Patient Information 2014 [place].

## 2013-07-26 ENCOUNTER — Other Ambulatory Visit: Payer: Self-pay | Admitting: Family Medicine

## 2013-08-14 ENCOUNTER — Ambulatory Visit (INDEPENDENT_AMBULATORY_CARE_PROVIDER_SITE_OTHER): Payer: BC Managed Care – PPO | Admitting: Family Medicine

## 2013-08-14 ENCOUNTER — Encounter: Payer: Self-pay | Admitting: Family Medicine

## 2013-08-14 VITALS — BP 139/94 | HR 97 | Temp 98.4°F | Resp 16 | Ht 69.0 in | Wt 198.0 lb

## 2013-08-14 DIAGNOSIS — B029 Zoster without complications: Secondary | ICD-10-CM

## 2013-08-14 DIAGNOSIS — B0229 Other postherpetic nervous system involvement: Secondary | ICD-10-CM

## 2013-08-14 DIAGNOSIS — Z23 Encounter for immunization: Secondary | ICD-10-CM

## 2013-08-14 DIAGNOSIS — E119 Type 2 diabetes mellitus without complications: Secondary | ICD-10-CM

## 2013-08-14 DIAGNOSIS — E1165 Type 2 diabetes mellitus with hyperglycemia: Secondary | ICD-10-CM

## 2013-08-14 DIAGNOSIS — IMO0001 Reserved for inherently not codable concepts without codable children: Secondary | ICD-10-CM

## 2013-08-14 DIAGNOSIS — I10 Essential (primary) hypertension: Secondary | ICD-10-CM

## 2013-08-14 DIAGNOSIS — E785 Hyperlipidemia, unspecified: Secondary | ICD-10-CM

## 2013-08-14 LAB — COMPREHENSIVE METABOLIC PANEL
ALT: 16 U/L (ref 0–53)
AST: 18 U/L (ref 0–37)
Albumin: 4.2 g/dL (ref 3.5–5.2)
Alkaline Phosphatase: 88 U/L (ref 39–117)
BUN: 20 mg/dL (ref 6–23)
CO2: 26 meq/L (ref 19–32)
Calcium: 9.7 mg/dL (ref 8.4–10.5)
Chloride: 102 mEq/L (ref 96–112)
Creat: 1.11 mg/dL (ref 0.50–1.35)
Glucose, Bld: 139 mg/dL — ABNORMAL HIGH (ref 70–99)
Potassium: 4.7 mEq/L (ref 3.5–5.3)
Sodium: 140 mEq/L (ref 135–145)
Total Bilirubin: 0.4 mg/dL (ref 0.2–1.2)
Total Protein: 8.2 g/dL (ref 6.0–8.3)

## 2013-08-14 LAB — HEMOGLOBIN A1C
HEMOGLOBIN A1C: 6 % — AB (ref <5.7)
Mean Plasma Glucose: 126 mg/dL — ABNORMAL HIGH (ref <117)

## 2013-08-14 MED ORDER — LOSARTAN POTASSIUM 50 MG PO TABS: ORAL_TABLET | ORAL | Status: DC

## 2013-08-14 MED ORDER — PRAVASTATIN SODIUM 40 MG PO TABS: 40.0000 mg | ORAL_TABLET | Freq: Every day | ORAL | Status: DC

## 2013-08-14 MED ORDER — METFORMIN HCL 1000 MG PO TABS: 1000.0000 mg | ORAL_TABLET | Freq: Two times a day (BID) | ORAL | Status: DC

## 2013-08-14 MED ORDER — GABAPENTIN 300 MG PO CAPS: 600.0000 mg | ORAL_CAPSULE | Freq: Three times a day (TID) | ORAL | Status: DC

## 2013-08-14 NOTE — Patient Instructions (Signed)
Try going off on your protonix - you may be able to go a few months without it so that you can replinish the good bacteria in your stomach and absorb more vitamins. If you need some additional otc zantac or pepcid during that time, that is fine. If your indigestion symptoms return, you can always restart your protonix if needed. If you keep on having pain from the shingles and need more pain medicine, please come back to clinic so we can discuss the best medication for you. Take 2 of your gabapentin 3x/day. Make sure you come back to your next visit FASTING so we can check your cholesterol. We are going to double your losartan - take 2 tabs of your current pills until gone and you get the new prescription.  Managing Your High Blood Pressure Blood pressure is a measurement of how forceful your blood is pressing against the walls of the arteries. Arteries are muscular tubes within the circulatory system. Blood pressure does not stay the same. Blood pressure rises when you are active, excited, or nervous; and it lowers during sleep and relaxation. If the numbers measuring your blood pressure stay above normal most of the time, you are at risk for health problems. High blood pressure (hypertension) is a long-term (chronic) condition in which blood pressure is elevated. A blood pressure reading is recorded as two numbers, such as 120 over 80 (or 120/80). The first, higher number is called the systolic pressure. It is a measure of the pressure in your arteries as the heart beats. The second, lower number is called the diastolic pressure. It is a measure of the pressure in your arteries as the heart relaxes between beats.  Keeping your blood pressure in a normal range is important to your overall health and prevention of health problems, such as heart disease and stroke. When your blood pressure is uncontrolled, your heart has to work harder than normal. High blood pressure is a very common condition in adults  because blood pressure tends to rise with age. Men and women are equally likely to have hypertension but at different times in life. Before age 93, men are more likely to have hypertension. After 65 years of age, women are more likely to have it. Hypertension is especially common in African Americans. This condition often has no signs or symptoms. The cause of the condition is usually not known. Your caregiver can help you come up with a plan to keep your blood pressure in a normal, healthy range. BLOOD PRESSURE STAGES Blood pressure is classified into four stages: normal, prehypertension, stage 1, and stage 2. Your blood pressure reading will be used to determine what type of treatment, if any, is necessary. Appropriate treatment options are tied to these four stages:  Normal  Systolic pressure (mm Hg): below 120.  Diastolic pressure (mm Hg): below 80. Prehypertension  Systolic pressure (mm Hg): 120 to 139.  Diastolic pressure (mm Hg): 80 to 89. Stage1  Systolic pressure (mm Hg): 140 to 159.  Diastolic pressure (mm Hg): 90 to 99. Stage2  Systolic pressure (mm Hg): 160 or above.  Diastolic pressure (mm Hg): 100 or above. RISKS RELATED TO HIGH BLOOD PRESSURE Managing your blood pressure is an important responsibility. Uncontrolled high blood pressure can lead to:  A heart attack.  A stroke.  A weakened blood vessel (aneurysm).  Heart failure.  Kidney damage.  Eye damage.  Metabolic syndrome.  Memory and concentration problems. HOW TO MANAGE YOUR BLOOD PRESSURE Blood pressure can  be managed effectively with lifestyle changes and medicines (if needed). Your caregiver will help you come up with a plan to bring your blood pressure within a normal range. Your plan should include the following: Education  Read all information provided by your caregivers about how to control blood pressure.  Educate yourself on the latest guidelines and treatment recommendations. New research  is always being done to further define the risks and treatments for high blood pressure. Lifestylechanges  Control your weight.  Avoid smoking.  Stay physically active.  Reduce the amount of salt in your diet.  Reduce stress.  Control any chronic conditions, such as high cholesterol or diabetes.  Reduce your alcohol intake. Medicines  Several medicines (antihypertensive medicines) are available, if needed, to bring blood pressure within a normal range. Communication  Review all the medicines you take with your caregiver because there may be side effects or interactions.  Talk with your caregiver about your diet, exercise habits, and other lifestyle factors that may be contributing to high blood pressure.  See your caregiver regularly. Your caregiver can help you create and adjust your plan for managing high blood pressure. RECOMMENDATIONS FOR TREATMENT AND FOLLOW-UP  The following recommendations are based on current guidelines for managing high blood pressure in nonpregnant adults. Use these recommendations to identify the proper follow-up period or treatment option based on your blood pressure reading. You can discuss these options with your caregiver.  Systolic pressure of 824 to 235 or diastolic pressure of 80 to 89: Follow up with your caregiver as directed.  Systolic pressure of 361 to 443 or diastolic pressure of 90 to 100: Follow up with your caregiver within 2 months.  Systolic pressure above 154 or diastolic pressure above 008: Follow up with your caregiver within 1 month.  Systolic pressure above 676 or diastolic pressure above 195: Consider antihypertensive therapy; follow up with your caregiver within 1 week.  Systolic pressure above 093 or diastolic pressure above 267: Begin antihypertensive therapy; follow up with your caregiver within 1 week. Document Released: 12/05/2011 Document Reviewed: 12/05/2011 [facility] Patient Information 2014 [place], [place].

## 2013-10-09 ENCOUNTER — Ambulatory Visit (INDEPENDENT_AMBULATORY_CARE_PROVIDER_SITE_OTHER): Payer: BC Managed Care – PPO | Admitting: Emergency Medicine

## 2013-10-09 ENCOUNTER — Ambulatory Visit (INDEPENDENT_AMBULATORY_CARE_PROVIDER_SITE_OTHER): Payer: BC Managed Care – PPO

## 2013-10-09 ENCOUNTER — Telehealth: Payer: Self-pay | Admitting: Internal Medicine

## 2013-10-09 VITALS — BP 116/60 | HR 122 | Temp 98.7°F | Resp 18 | Ht 69.0 in | Wt 197.4 lb

## 2013-10-09 DIAGNOSIS — B029 Zoster without complications: Secondary | ICD-10-CM

## 2013-10-09 DIAGNOSIS — J8410 Pulmonary fibrosis, unspecified: Secondary | ICD-10-CM

## 2013-10-09 DIAGNOSIS — B0229 Other postherpetic nervous system involvement: Secondary | ICD-10-CM | POA: Insufficient documentation

## 2013-10-09 MED ORDER — ALBUTEROL SULFATE (2.5 MG/3ML) 0.083% IN NEBU
5.0000 mg | INHALATION_SOLUTION | Freq: Once | RESPIRATORY_TRACT | Status: AC
  Administered 2013-10-09: 5 mg via RESPIRATORY_TRACT

## 2013-10-09 MED ORDER — GABAPENTIN 400 MG PO CAPS: 800.0000 mg | ORAL_CAPSULE | Freq: Three times a day (TID) | ORAL | Status: DC

## 2013-10-09 MED ORDER — AZITHROMYCIN 250 MG PO TABS: ORAL_TABLET | ORAL | Status: DC

## 2013-10-09 MED ORDER — IPRATROPIUM BROMIDE 0.02 % IN SOLN
0.5000 mg | Freq: Once | RESPIRATORY_TRACT | Status: AC
  Administered 2013-10-09: 0.5 mg via RESPIRATORY_TRACT

## 2013-10-09 MED ORDER — HYDROCODONE-ACETAMINOPHEN 5-325 MG PO TABS: 1.0000 | ORAL_TABLET | Freq: Four times a day (QID) | ORAL | Status: DC | PRN

## 2013-10-09 NOTE — Progress Notes (Signed)
[facility] [address] [place] [address] [phone]  Date:  10/09/2013   Name:  [name]   DOB:  1950/04/13   MRN:  [id number]  PCP:  Reginia Forts, MD    Chief Complaint: Shingles, Cough, congestion and Wheezing   History of Present Illness:  JAVID KEMLER is a 63 y.o. very pleasant male patient who presents with the following:  Patient developed shingles in April and is still experiencing pain in the distribution of the T12 dermatome.  He says the pain is tolerable when he lays on that side but is still problematic.  No improvement with over the counter medications or other home remedies.  Has a cough productive of green sputum.  With increased shortness of breath.  No nausea or vomiting.  Has increased wheezing and watery nasal drainage.  Not compliant with MDI use.  Denies other complaint or health concern today.   Patient Active Problem List   Diagnosis Date Noted  . Shingles 10/09/2013  . Post herpetic neuralgia 10/09/2013  . Rheumatoid arthritis 06/26/2013  . Other and unspecified hyperlipidemia 01/28/2013  . Type II or unspecified type diabetes mellitus without mention of complication, uncontrolled 01/27/2013  . Essential hypertension, benign 01/27/2013  . COUGH 10/10/2009  . PULMONARY FIBROSIS ILD POST INFLAMMATORY CHRONIC 12/30/2007    Past Medical History  Diagnosis Date  . Rheumatoid arthritis(714.0) 2005  . Pulmonary fibrosis   . Hypertension   . GERD (gastroesophageal reflux disease)   . Diabetes mellitus without complication     Past Surgical History  Procedure Laterality Date  . Excision mass r lateral arm 2010  2010  . Mass excision  03/08/2011    Procedure: EXCISION MASS;  Surgeon: Cammie Sickle., MD;  Location: [facility];  Service: Orthopedics;  Laterality: Right;  excision rheumatoid nodule right ulna    History  Substance Use Topics  . Smoking status: Former Smoker -- 0.50 packs/day for 30  years    Quit date: 01/25/2008  . Smokeless tobacco: Not on file  . Alcohol Use: No    History reviewed. No pertinent family history.  No Known Allergies  Medication list has been reviewed and updated.  Current Outpatient Prescriptions on File Prior to Visit  Medication Sig Dispense Refill  . Adalimumab (HUMIRA) 20 MG/0.4ML KIT As directed every 2 wks      . aspirin 81 MG tablet Take 81 mg by mouth daily.      . celecoxib (CELEBREX) 200 MG capsule Take 200 mg by mouth daily.       Marland Kitchen gabapentin (NEURONTIN) 300 MG capsule Take 2 capsules (600 mg total) by mouth 3 (three) times daily.  180 capsule  3  . HYDROcodone-acetaminophen (NORCO/VICODIN) 5-325 MG per tablet Take 1 tablet by mouth every 6 (six) hours as needed for moderate pain.  30 tablet  0  . losartan (COZAAR) 50 MG tablet TAKE 1 TABLET BY MOUTH ONCE DAILY  90 tablet  1  . metFORMIN (GLUCOPHAGE) 1000 MG tablet Take 1 tablet (1,000 mg total) by mouth 2 (two) times daily with a meal.  180 tablet  1  . oxyCODONE-acetaminophen (PERCOCET) 7.5-325 MG per tablet Take 1 tablet by mouth every 4 (four) hours as needed for pain.  30 tablet  0  . pantoprazole (PROTONIX) 20 MG tablet Take 1 tablet (20 mg total) by mouth daily.  60 tablet  3  . pravastatin (PRAVACHOL) 40 MG tablet Take 1 tablet (  40 mg total) by mouth at bedtime.  90 tablet  1  . VENTOLIN HFA 108 (90 BASE) MCG/ACT inhaler USE 2 PUFFS EVERY 4 TO 6 HOURS AS NEEDED  8.5 each  5   No current facility-administered medications on file prior to visit.    Review of Systems:  As per HPI, otherwise negative.    Physical Examination: Filed Vitals:   10/09/13 1440  BP: 116/60  Pulse: 122  Temp: 98.7 F (37.1 C)  Resp: 18   Filed Vitals:   10/09/13 1440  Height: '5\' 9"'  (1.753 m)  Weight: 197 lb 6.4 oz (89.54 kg)   Body mass index is 29.14 kg/(m^2). Ideal Body Weight: Weight in (lb) to have BMI = 25: 168.9  GEN: WDWN, NAD, Non-toxic, A & O x 3 HEENT: Atraumatic,  Normocephalic. Neck supple. No masses, No LAD. Ears and Nose: No external deformity. CV: RRR, No M/G/R. No JVD. No thrill. No extra heart sounds. PULM: CTA B, no wheezes, crackles, rhonchi. No retractions. No resp. distress. No accessory muscle use. ABD: S, NT, ND, +BS. No rebound. No HSM. EXTR: No c/c/e NEURO Normal gait.  PSYCH: Normally interactive. Conversant. Not depressed or anxious appearing.  Calm demeanor.  SKIN;  Scarring and hypersensitivity in the left posterolateral chest wall.  Assessment and Plan: Pulmonary fibrosis with increased shortness of breath Post herpetic neuralgia Use inhaler as directed q4h Add hydrocodone Increase gabapentin  Signed,  Ellison Carwin, MD   [facility] reading (PRIMARY) by  Dr. Ouida Sills  unchanged.

## 2013-10-09 NOTE — Telephone Encounter (Signed)
[name]

## 2013-10-12 NOTE — Telephone Encounter (Signed)
lmomtcb x 2  

## 2013-10-13 NOTE — Telephone Encounter (Signed)
ATC PT NA WCB 

## 2013-10-14 NOTE — Telephone Encounter (Signed)
ATC NA line d/c wcb

## 2013-10-14 NOTE — Telephone Encounter (Signed)
ATC PT. Line rang then was d/c x 3 wcb

## 2013-10-15 NOTE — Telephone Encounter (Signed)
ATC line after ringing several times line would d/c x 3 wcb

## 2013-10-16 NOTE — Telephone Encounter (Signed)
[email or web address]. Jennifer Castillo, CMA  

## 2013-10-19 NOTE — Telephone Encounter (Signed)
Will sign off of the message at this time and wait for pt to call back.

## 2013-11-04 NOTE — Progress Notes (Signed)
Subjective:    Patient ID: [name], male    DOB: [birth year], 63 y.o.   MRN: [id number] Chief Complaint  Patient presents with  . Diabetes     HPI  Past Medical History  Diagnosis Date  . Rheumatoid arthritis(714.0) 2005  . Pulmonary fibrosis   . Hypertension   . GERD (gastroesophageal reflux disease)   . Diabetes mellitus without complication    Current Outpatient Prescriptions on File Prior to Visit  Medication Sig Dispense Refill  . Adalimumab (HUMIRA) 20 MG/0.4ML KIT As directed every 2 wks      . aspirin 81 MG tablet Take 81 mg by mouth daily.      . celecoxib (CELEBREX) 200 MG capsule Take 200 mg by mouth daily.       Marland Kitchen oxyCODONE-acetaminophen (PERCOCET) 7.5-325 MG per tablet Take 1 tablet by mouth every 4 (four) hours as needed for pain.  30 tablet  0  . pantoprazole (PROTONIX) 20 MG tablet Take 1 tablet (20 mg total) by mouth daily.  60 tablet  3  . VENTOLIN HFA 108 (90 BASE) MCG/ACT inhaler USE 2 PUFFS EVERY 4 TO 6 HOURS AS NEEDED  8.5 each  5   No current facility-administered medications on file prior to visit.   No Known Allergies   Review of Systems  Constitutional: Negative for fever, chills, diaphoresis, activity change and appetite change.  Eyes: Negative for visual disturbance.  Respiratory: Negative for shortness of breath.   Cardiovascular: Negative for chest pain and leg swelling.  Musculoskeletal: Positive for back pain and myalgias. Negative for arthralgias, gait problem and joint swelling.  Skin: Positive for rash. Negative for color change and wound.  Neurological: Negative for dizziness, syncope, facial asymmetry, weakness, light-headedness, numbness and headaches.  Hematological: Negative for adenopathy. Does not bruise/bleed easily.  Psychiatric/Behavioral: Positive for sleep disturbance.      BP 139/94  Pulse 97  Temp(Src) 98.4 F (36.9 C)  Resp 16  Ht _0  (1.753 m)  Wt 198 lb (89.812 kg)  BMI 29.23 kg/m2  SpO2 92% Objective:   Physical Exam  Constitutional: He is oriented to person, place, and time. He appears well-developed and well-nourished. No distress.  HENT:  Head: Normocephalic and atraumatic.  Eyes: Conjunctivae are normal. Pupils are equal, round, and reactive to light. No scleral icterus.  Neck: Normal range of motion. Neck supple. No thyromegaly present.  Cardiovascular: Normal rate, regular rhythm, normal heart sounds and intact distal pulses.   Pulmonary/Chest: Effort normal and breath sounds normal. No respiratory distress.  Musculoskeletal: He exhibits no edema.  Lymphadenopathy:    He has no cervical adenopathy.  Neurological: He is alert and oriented to person, place, and time.  Skin: Skin is warm and dry. He is not diaphoretic.  Psychiatric: He has a normal mood and affect. His behavior is normal.          Assessment & Plan:   Post zoster neuralgia  Type II or unspecified type diabetes mellitus without mention of complication, uncontrolled - Plan: Comprehensive metabolic panel, Hemoglobin A1c  Other and unspecified hyperlipidemia - Plan: Comprehensive metabolic panel  Essential hypertension, benign - Plan: Comprehensive metabolic panel  Shingles - Plan: DISCONTINUED: gabapentin (NEURONTIN) 300 MG capsule  Need for prophylactic vaccination with combined diphtheria-tetanus-pertussis (DTP) vaccine - Plan: Tdap vaccine greater than or equal to 7yo IM  Meds ordered this encounter  Medications  . gabapentin (NEURONTIN) 300 MG capsule    Sig: Take 2 capsules (600 mg total)  by mouth 3 (three) times daily.    Dispense:  180 capsule    Refill:  3    Order Specific Question:  Supervising Provider    Answer:  DOOLITTLE, ROBERT P [[id number]]  . losartan (COZAAR) 50 MG tablet    Sig: TAKE 1 TABLET BY MOUTH ONCE DAILY    Dispense:  90 tablet    Refill:  1  . pravastatin (PRAVACHOL) 40 MG tablet    Sig: Take 1 tablet (40 mg total) by mouth at bedtime.    Dispense:  90 tablet    Refill:  1    . metFORMIN (GLUCOPHAGE) 1000 MG tablet    Sig: Take 1 tablet (1,000 mg total) by mouth 2 (two) times daily with a meal.    Dispense:  180 tablet    Refill:  1    Order Specific Question:  Supervising Provider    Answer:  Johnna Acosta [[id number]]    [name], MD MPH   Results for orders placed in visit on 08/14/13  COMPREHENSIVE METABOLIC PANEL      Result Value Ref Range   Sodium 140  135 - 145 mEq/L   Potassium 4.7  3.5 - 5.3 mEq/L   Chloride 102  96 - 112 mEq/L   CO2 26  19 - 32 mEq/L   Glucose, Bld 139 (*) 70 - 99 mg/dL   BUN 20  6 - 23 mg/dL   Creat 1.11  0.50 - 1.35 mg/dL   Total Bilirubin 0.4  0.2 - 1.2 mg/dL   Alkaline Phosphatase 88  39 - 117 U/L   AST 18  0 - 37 U/L   ALT 16  0 - 53 U/L   Total Protein 8.2  6.0 - 8.3 g/dL   Albumin 4.2  3.5 - 5.2 g/dL   Calcium 9.7  8.4 - 10.5 mg/dL  HEMOGLOBIN A1C      Result Value Ref Range   Hemoglobin A1C 6.0 (*) <5.7 %   Mean Plasma Glucose 126 (*) <117 mg/dL

## 2013-11-17 ENCOUNTER — Institutional Professional Consult (permissible substitution): Payer: BC Managed Care – PPO | Admitting: Internal Medicine

## 2013-11-20 ENCOUNTER — Ambulatory Visit (INDEPENDENT_AMBULATORY_CARE_PROVIDER_SITE_OTHER): Payer: BC Managed Care – PPO | Admitting: Family Medicine

## 2013-11-20 ENCOUNTER — Encounter: Payer: Self-pay | Admitting: Family Medicine

## 2013-11-20 VITALS — BP 112/64 | HR 105 | Temp 97.6°F | Resp 18 | Ht 68.0 in | Wt 197.4 lb

## 2013-11-20 DIAGNOSIS — IMO0001 Reserved for inherently not codable concepts without codable children: Secondary | ICD-10-CM

## 2013-11-20 DIAGNOSIS — I10 Essential (primary) hypertension: Secondary | ICD-10-CM

## 2013-11-20 DIAGNOSIS — E119 Type 2 diabetes mellitus without complications: Secondary | ICD-10-CM

## 2013-11-20 DIAGNOSIS — R000 Tachycardia, unspecified: Secondary | ICD-10-CM

## 2013-11-20 DIAGNOSIS — Z79899 Other long term (current) drug therapy: Secondary | ICD-10-CM

## 2013-11-20 DIAGNOSIS — M069 Rheumatoid arthritis, unspecified: Secondary | ICD-10-CM

## 2013-11-20 DIAGNOSIS — J8410 Pulmonary fibrosis, unspecified: Secondary | ICD-10-CM

## 2013-11-20 DIAGNOSIS — B0229 Other postherpetic nervous system involvement: Secondary | ICD-10-CM

## 2013-11-20 DIAGNOSIS — E1165 Type 2 diabetes mellitus with hyperglycemia: Principal | ICD-10-CM

## 2013-11-20 DIAGNOSIS — E785 Hyperlipidemia, unspecified: Secondary | ICD-10-CM

## 2013-11-20 LAB — COMPREHENSIVE METABOLIC PANEL
ALT: 21 U/L (ref 0–53)
AST: 18 U/L (ref 0–37)
Albumin: 3.9 g/dL (ref 3.5–5.2)
Alkaline Phosphatase: 90 U/L (ref 39–117)
BUN: 21 mg/dL (ref 6–23)
CALCIUM: 9.1 mg/dL (ref 8.4–10.5)
CHLORIDE: 105 meq/L (ref 96–112)
CO2: 22 meq/L (ref 19–32)
CREATININE: 1.06 mg/dL (ref 0.50–1.35)
Glucose, Bld: 111 mg/dL — ABNORMAL HIGH (ref 70–99)
POTASSIUM: 4.5 meq/L (ref 3.5–5.3)
Sodium: 137 mEq/L (ref 135–145)
Total Bilirubin: 0.4 mg/dL (ref 0.2–1.2)
Total Protein: 7.7 g/dL (ref 6.0–8.3)

## 2013-11-20 LAB — LIPID PANEL
Cholesterol: 103 mg/dL (ref 0–200)
HDL: 33 mg/dL — ABNORMAL LOW (ref >39)
LDL CALC: 51 mg/dL (ref 0–99)
TRIGLYCERIDES: 94 mg/dL (ref <150)
Total CHOL/HDL Ratio: 3.1 Ratio
VLDL: 19 mg/dL (ref 0–40)

## 2013-11-20 LAB — POCT GLYCOSYLATED HEMOGLOBIN (HGB A1C): Hemoglobin A1C: 5.6

## 2013-11-20 MED ORDER — PRAVASTATIN SODIUM 40 MG PO TABS: 40.0000 mg | ORAL_TABLET | Freq: Every day | ORAL | Status: DC

## 2013-11-20 MED ORDER — METFORMIN HCL 1000 MG PO TABS: 1000.0000 mg | ORAL_TABLET | Freq: Two times a day (BID) | ORAL | Status: DC

## 2013-11-20 MED ORDER — ALBUTEROL SULFATE HFA 108 (90 BASE) MCG/ACT IN AERS: 2.0000 | INHALATION_SPRAY | RESPIRATORY_TRACT | Status: AC | PRN

## 2013-11-20 MED ORDER — LOSARTAN POTASSIUM 50 MG PO TABS: ORAL_TABLET | ORAL | Status: DC

## 2013-11-20 NOTE — Patient Instructions (Signed)
Diabetes Mellitus and Food It is important for you to manage your blood sugar (glucose) level. Your blood glucose level can be greatly affected by what you eat. Eating healthier foods in the appropriate amounts throughout the day at about the same time each day will help you control your blood glucose level. It can also help slow or prevent worsening of your diabetes mellitus. Healthy eating may even help you improve the level of your blood pressure and reach or maintain a healthy weight.  HOW CAN FOOD AFFECT ME? Carbohydrates Carbohydrates affect your blood glucose level more than any other type of food. Your dietitian will help you determine how many carbohydrates to eat at each meal and teach you how to count carbohydrates. Counting carbohydrates is important to keep your blood glucose at a healthy level, especially if you are using insulin or taking certain medicines for diabetes mellitus. Alcohol Alcohol can cause sudden decreases in blood glucose (hypoglycemia), especially if you use insulin or take certain medicines for diabetes mellitus. Hypoglycemia can be a life-threatening condition. Symptoms of hypoglycemia (sleepiness, dizziness, and disorientation) are similar to symptoms of having too much alcohol.  If your health care provider has given you approval to drink alcohol, do so in moderation and use the following guidelines:  Women should not have more than one drink per day, and men should not have more than two drinks per day. One drink is equal to:  12 oz of beer.  5 oz of wine.  1 oz of hard liquor.  Do not drink on an empty stomach.  Keep yourself hydrated. Have water, diet soda, or unsweetened iced tea.  Regular soda, juice, and other mixers might contain a lot of carbohydrates and should be counted. WHAT FOODS ARE NOT RECOMMENDED? As you make food choices, it is important to remember that all foods are not the same. Some foods have fewer nutrients per serving than other  foods, even though they might have the same number of calories or carbohydrates. It is difficult to get your body what it needs when you eat foods with fewer nutrients. Examples of foods that you should avoid that are high in calories and carbohydrates but low in nutrients include:  Trans fats (most processed foods list trans fats on the Nutrition Facts label).  Regular soda.  Juice.  Candy.  Sweets, such as cake, pie, doughnuts, and cookies.  Fried foods. WHAT FOODS CAN I EAT? Have nutrient-rich foods, which will nourish your body and keep you healthy. The food you should eat also will depend on several factors, including:  The calories you need.  The medicines you take.  Your weight.  Your blood glucose level.  Your blood pressure level.  Your cholesterol level. You also should eat a variety of foods, including:  Protein, such as meat, poultry, fish, tofu, nuts, and seeds (lean animal proteins are best).  Fruits.  Vegetables.  Dairy products, such as milk, cheese, and yogurt (low fat is best).  Breads, grains, pasta, cereal, rice, and beans.  Fats such as olive oil, trans fat-free margarine, canola oil, avocado, and olives. DOES EVERYONE WITH DIABETES MELLITUS HAVE THE SAME MEAL PLAN? Because every person with diabetes mellitus is different, there is not one meal plan that works for everyone. It is very important that you meet with a dietitian who will help you create a meal plan that is just right for you. Document Released: 12/07/2004 Document Revised: 03/17/2013 Document Reviewed: 02/06/2013 [employer] Patient Information 2015 [employer]. This   information is not intended to replace advice given to you by your health care provider. Make sure you discuss any questions you have with your health care provider.  

## 2013-11-20 NOTE — Progress Notes (Addendum)
Subjective:  This chart was scribed for Riley Cheadle, MD by Donato Schultz, Medical Scribe. This patient was seen in Room 27 and the patient's care was started at 9:31 AM.   Patient ID: [name], male    DOB: 1951/01/15, 63 y.o.   MRN: [id number]  Chief Complaint  Patient presents with  . Follow-up    diabetes    HPI HPI Comments: [name] is a 63 y.o. male who presents to the [facility] for a DM follow-up.  At last visit A1c was 6.0.  He developed chronic post-herpatic T12 neuralgia after shingles in April of this year.  One month previously his Gabapentin was increased from 600 TID to 800 TID and he was given PRN Hydrocodone number 30.  He was seen at [facility] by Dr. Chase Caller but has not been seen there in 3 years.  He has an appointment with Dr. Chase Caller on September 10. 02 sat chronically runs very low - 90 - 93% due to his history of interstitial lung disease.    He is no longer taking OTC Protonix, Oxycodone, or Hydrocodone.  He takes 2 Gabapentin 3 times a day and 1 Metformin twice a day.  He is still taking Losartan, baby aspirin, Humira and Celebrex once a day.  He does not take any OTC medications.  He uses his Ventolin inhaler once a day.  Dr. Estanislado Pandy follows his post-herpatic T12 neuralgia.  His shingles pain has improved.  His last appointment with his eye doctor was 2-3 weeks ago and he is having no trouble with his vision.  He is not having any problems with his feet.    Past Medical History  Diagnosis Date  . Rheumatoid arthritis(714.0) 2005  . Pulmonary fibrosis   . Hypertension   . GERD (gastroesophageal reflux disease)   . Diabetes mellitus without complication    Past Surgical History  Procedure Laterality Date  . Excision mass r lateral arm 2010  2010  . Mass excision  03/08/2011    Procedure: EXCISION MASS;  Surgeon: Cammie Sickle., MD;  Location: [place];  Service: Orthopedics;  Laterality: Right;   excision rheumatoid nodule right ulna   No family history on file. History   Social History  . Marital Status: Single    Spouse Name: N/A    Number of Children: N/A  . Years of Education: N/A   Occupational History  . 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 Smoking status: Former Smoker -- 0.50 packs/day for 30 years    Quit date: 01/25/2008  . Smokeless tobacco: Not on file  . Alcohol Use: No  . Drug Use: Not on file  . Sexual Activity: Not on file   Other Topics Concern  . Not on file   Social History Narrative  . No narrative on file   No Known Allergies  Review of Systems  Constitutional: Negative for fever and chills.  Eyes: Negative for visual disturbance.  Respiratory: Positive for cough, chest tightness and shortness of breath.   Cardiovascular: Negative for chest pain and leg swelling.  Musculoskeletal: Positive for arthralgias, back pain, joint swelling and myalgias.  Skin: Negative for rash.  Neurological: Negative for dizziness, syncope, facial asymmetry, weakness, light-headedness and numbness.     Objective:  BP 112/64  Pulse 105  Temp(Src) 97.6 F (36.4 C) (Oral)  Resp 18  Ht 5\' 8"  (1.727 m)  Wt 197 lb 6.4  oz (89.54 kg)  BMI 30.02 kg/m2  SpO2 90% Physical Exam  Nursing note and vitals reviewed. Constitutional: He is oriented to person, place, and time. He appears well-developed and well-nourished.  HENT:  Head: Normocephalic and atraumatic.  Eyes: EOM are normal.  Neck: Normal range of motion.  Cardiovascular: Regular rhythm, S1 normal, S2 normal and normal heart sounds.  Tachycardia present.  Exam reveals no gallop and no friction rub.   No murmur heard. Pulmonary/Chest: Effort normal. No respiratory distress. He has decreased breath sounds. He has no wheezes. He has rales. He exhibits no tenderness.  Inspiratory and expiratory rales worse bibasalarly.    Musculoskeletal: Normal range of motion.  Neurological: He is alert and  oriented to person, place, and time.  Skin: Skin is warm and dry.  Psychiatric: He has a normal mood and affect. His behavior is normal.   Results for orders placed in visit on 11/20/13  POCT GLYCOSYLATED HEMOGLOBIN (HGB A1C)      Result Value Ref Range   Hemoglobin A1C 5.6      Assessment & Plan:   Type II or unspecified type diabetes mellitus without mention of complication, uncontrolled - Plan: HM Diabetes Foot Exam, POCT glycosylated hemoglobin (Hb A1C) - doing great. nml monofil, cont asa, opto exam (one in the mall) done sev mos ago and nml per pt, microalb done 11/14 - repeat at f/u, on arb and statin.  Rheumatoid arthritis  PULMONARY FIBROSIS ILD POST INFLAMMATORY CHRONIC - Discussed with patient the possibility for a need for oxygen.  Discussed possible need for a long-acting inhaler.  Patient is chronically tachycardic.  EKG from January 21, 2013 shows sinus tach and hopefully this will come down with improved lung function.  Has appt w/ pulmonology soon.  Needs pneumovax at f/u  Post herpetic neuralgia  Other and unspecified hyperlipidemia - Plan: Lipid panel - goal LDL <100  Essential hypertension, benign  Encounter for long-term (current) use of other medications - Plan: Comprehensive metabolic panel.  Discuss colonoscopy at f/u.  Tachycardia  Type II or unspecified type diabetes mellitus without mention of complication, not stated as uncontrolled  Meds ordered this encounter  Medications  . losartan (COZAAR) 50 MG tablet    Sig: TAKE 1 TABLET BY MOUTH ONCE DAILY FOR BLOOD PRESSURE    Dispense:  90 tablet    Refill:  1  . metFORMIN (GLUCOPHAGE) 1000 MG tablet    Sig: Take 1 tablet (1,000 mg total) by mouth 2 (two) times daily with a meal. FOR DIABETES    Dispense:  180 tablet    Refill:  1  . pravastatin (PRAVACHOL) 40 MG tablet    Sig: Take 1 tablet (40 mg total) by mouth at bedtime. FOR CHOLESTEROL    Dispense:  90 tablet    Refill:  1  . albuterol (VENTOLIN  HFA) 108 (90 BASE) MCG/ACT inhaler    Sig: Inhale 2 puffs into the lungs every 4 (four) hours as needed for wheezing or shortness of breath.    Dispense:  8.5 each    Refill:  11    I personally performed the services described in this documentation, which was scribed in my presence. The recorded information has been reviewed and considered, and addended by me as needed.  Riley Cheadle, MD MPH  Results for orders placed in visit on 11/20/13  LIPID PANEL      Result Value Ref Range   Cholesterol 103  0 - 200 mg/dL   Triglycerides  94  <150 mg/dL   HDL 33 (*) >39 mg/dL   Total CHOL/HDL Ratio 3.1     VLDL 19  0 - 40 mg/dL   LDL Cholesterol 51  0 - 99 mg/dL  COMPREHENSIVE METABOLIC PANEL      Result Value Ref Range   Sodium 137  135 - 145 mEq/L   Potassium 4.5  3.5 - 5.3 mEq/L   Chloride 105  96 - 112 mEq/L   CO2 22  19 - 32 mEq/L   Glucose, Bld 111 (*) 70 - 99 mg/dL   BUN 21  6 - 23 mg/dL   Creat 1.06  0.50 - 1.35 mg/dL   Total Bilirubin 0.4  0.2 - 1.2 mg/dL   Alkaline Phosphatase 90  39 - 117 U/L   AST 18  0 - 37 U/L   ALT 21  0 - 53 U/L   Total Protein 7.7  6.0 - 8.3 g/dL   Albumin 3.9  3.5 - 5.2 g/dL   Calcium 9.1  8.4 - 10.5 mg/dL  POCT GLYCOSYLATED HEMOGLOBIN (HGB A1C)      Result Value Ref Range   Hemoglobin A1C 5.6

## 2013-11-22 ENCOUNTER — Encounter: Payer: Self-pay | Admitting: Family Medicine

## 2013-12-03 ENCOUNTER — Ambulatory Visit (INDEPENDENT_AMBULATORY_CARE_PROVIDER_SITE_OTHER): Payer: BC Managed Care – PPO | Admitting: Internal Medicine

## 2013-12-03 ENCOUNTER — Encounter: Payer: Self-pay | Admitting: Internal Medicine

## 2013-12-03 VITALS — BP 116/74 | HR 105 | Ht 69.0 in | Wt 199.0 lb

## 2013-12-03 DIAGNOSIS — R918 Other nonspecific abnormal finding of lung field: Secondary | ICD-10-CM

## 2013-12-03 DIAGNOSIS — J84112 Idiopathic pulmonary fibrosis: Secondary | ICD-10-CM

## 2013-12-03 DIAGNOSIS — J8410 Pulmonary fibrosis, unspecified: Secondary | ICD-10-CM

## 2013-12-03 DIAGNOSIS — M069 Rheumatoid arthritis, unspecified: Secondary | ICD-10-CM

## 2013-12-03 NOTE — Progress Notes (Signed)
Subjective:    Patient ID: [name], male    DOB: 10-Aug-1950, 63 y.o.   MRN: [id number]  PC Galen Daft, MD Rheum: Dr Bo Merino  HPI  IOV 12/03/2013    Chief Complaint  Patient presents with  . Pulmonary Consult    Referred by Dr. Estanislado Pandy for ILD.    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 He is an extremely poor historian. Information is gathered from him, outside records, [facility] interstitial lung disease clinic 2013 notes  As best as I can tell patient has rheumatoid arthritis since approximately 2010. He is being treated with Celebrex and Humira for possibly the same amount of time. His symptoms are largely limited to the joints. Apparently these medications to control his joint pain. 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 He was followed at [facility] interstitial lung disease clinic by Dr. Wynn Maudlin. Last visit was recorded in the spring of 2013 minutes vital capacity was 58% and diffusion capacity was 40% consistent with moderate interstitial lung disease. Patient himself reports moderate of cough and moderate amount of dyspnea. He says that both these symptoms only impacting his quality of life to a mild extent and is able to live well despite these symptoms. In the office he desaturated immediately after standing up but despite this he feels that his symptoms are impacting his quality of life a little mild extent.  He would not tell me why he switched from [facility] but it appears that probably because he lives alone  Review of this imaging shows that he's had pulmonary fibrosis at least as of 2005 which means it predates the diagnoses of rheumatoid arthritis which is typically the case in many men  CT scan of the chest 06/28/2010: In my opinion has classic UIP features with significant  honeycombing. This is very similar to a CT scan of the chest in September 2009 and February 2005 but likely worse compared to 2005  In terms of his rheumatoid arthritis according to outside notes his positive rheumatoid factor and positive CCP. He does not have any synovitis on exam as of July 2015.   Other issues  - . reports that he quit smoking about 5 years ago. He has never used smokeless tobacco.  - [facility] records suggest that he had right middle lobe nodule that had moderate activity on PET scan in 2013. These have not been followed up since then. Patient completely denies knowledge of these      Past Medical History  Diagnosis Date  . Rheumatoid arthritis(714.0) 2005  . Pulmonary fibrosis   . Hypertension   . GERD (gastroesophageal reflux disease)   . Diabetes mellitus without complication   . Dyspnea      No family history on file.   History   Social History  . Marital Status: Single    Spouse Name: N/A    Number of Children: N/A  . Years of Education: N/A   Occupational History  . retired   .     Social History Main Topics  . Smoking status: Former Smoker -- 0.50 packs/day for 30 years    Quit date: 01/25/2008  . Smokeless tobacco: Never Used  . Alcohol Use: No  . Drug Use: No  . Sexual Activity: Not on file   Other Topics Concern  . Not on file  Social History Narrative  . No narrative on file     No Known Allergies   Outpatient Prescriptions Prior to Visit  Medication Sig Dispense Refill  . Adalimumab (HUMIRA) 20 MG/0.4ML KIT As directed every 2 wks      . albuterol (VENTOLIN HFA) 108 (90 BASE) MCG/ACT inhaler Inhale 2 puffs into the lungs every 4 (four) hours as needed for wheezing or shortness of breath.  8.5 each  11  . aspirin 81 MG tablet Take 81 mg by mouth daily.      . celecoxib (CELEBREX) 200 MG capsule Take 200 mg by mouth daily.       Marland Kitchen gabapentin (NEURONTIN) 400 MG capsule Take 2 capsules (800 mg total) by mouth 3 (three) times  daily.  180 capsule  3  . losartan (COZAAR) 50 MG tablet TAKE 1 TABLET BY MOUTH ONCE DAILY FOR BLOOD PRESSURE  90 tablet  1  . metFORMIN (GLUCOPHAGE) 1000 MG tablet Take 1 tablet (1,000 mg total) by mouth 2 (two) times daily with a meal. FOR DIABETES  180 tablet  1  . pravastatin (PRAVACHOL) 40 MG tablet Take 1 tablet (40 mg total) by mouth at bedtime. FOR CHOLESTEROL  90 tablet  1   No facility-administered medications prior to visit.      Review of Systems  Constitutional: Negative for fever and unexpected weight change.  HENT: Negative for congestion, dental problem, ear pain, nosebleeds, postnasal drip, rhinorrhea, sinus pressure, sneezing, sore throat and trouble swallowing.   Eyes: Negative for redness and itching.  Respiratory: Positive for cough and shortness of breath. Negative for chest tightness and wheezing.   Cardiovascular: Negative for palpitations and leg swelling.  Gastrointestinal: Negative for nausea and vomiting.  Genitourinary: Negative for dysuria.  Musculoskeletal: Negative for joint swelling.  Skin: Negative for rash.  Neurological: Negative for headaches.  Hematological: Does not bruise/bleed easily.  Psychiatric/Behavioral: Negative for dysphoric mood. The patient is not nervous/anxious.        Objective:   Physical Exam  Nursing note and vitals reviewed. Constitutional: He is oriented to person, place, and time. He appears well-developed and well-nourished. No distress.  Disheveled Smell of poor hygiene from his body  HENT:  Head: Normocephalic and atraumatic.  Right Ear: External ear normal.  Left Ear: External ear normal.  Mouth/Throat: Oropharynx is clear and moist. No oropharyngeal exudate.  Eyes: Conjunctivae and EOM are normal. Pupils are equal, round, and reactive to light. Right eye exhibits no discharge. Left eye exhibits no discharge. No scleral icterus.  Neck: Normal range of motion. Neck supple. No JVD present. No tracheal deviation  present. No thyromegaly present.  Cardiovascular: Normal rate, regular rhythm and intact distal pulses.  Exam reveals no gallop and no friction rub.   No murmur heard. Pulmonary/Chest: Effort normal. No respiratory distress. He has no wheezes. He has rales. He exhibits no tenderness.  Extensive bilateral crackles  Abdominal: Soft. Bowel sounds are normal. He exhibits no distension and no mass. There is no tenderness. There is no rebound and no guarding.  Musculoskeletal: Normal range of motion. He exhibits no edema and no tenderness.  Lymphadenopathy:    He has no cervical adenopathy.  Neurological: He is alert and oriented to person, place, and time. He has normal reflexes. No cranial nerve deficit. Coordination normal.  Skin: Skin is warm and dry. No rash noted. He is not diaphoretic. No erythema. No pallor.  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 He is reluctant to have probable pulmonary function test. He is a poor insight into his disease and only to resume simplistic palliative approach helping his quality of life. He's OK starting oxygen if this would help his symptoms. He's OK having some limited workup to stage the disease  Plan  - start portable oxygen system 2L [place] with walking; will help you - do HRCT wo contrast - do office spirometry at followup   #Lung nodules seen on CT in 2013 at [facility] but is moderately active on PET scan in 2013 at [id number]  - Differential diagnosis here is either rheumatoid  nodules or lung cancer  - do HRCT wo contrast  #Followup 1 month - after completing above Office Spirometry (not June Leap) at time of  followup

## 2013-12-03 NOTE — Patient Instructions (Signed)
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

## 2013-12-10 ENCOUNTER — Ambulatory Visit (INDEPENDENT_AMBULATORY_CARE_PROVIDER_SITE_OTHER)
Admission: RE | Admit: 2013-12-10 | Discharge: 2013-12-10 | Disposition: A | Discharge status: Home / Self Care | Payer: BC Managed Care – PPO | Source: Ambulatory Visit | Attending: Internal Medicine | Admitting: Internal Medicine

## 2013-12-10 DIAGNOSIS — J8410 Pulmonary fibrosis, unspecified: Secondary | ICD-10-CM

## 2013-12-10 DIAGNOSIS — R918 Other nonspecific abnormal finding of lung field: Secondary | ICD-10-CM

## 2013-12-10 DIAGNOSIS — J84112 Idiopathic pulmonary fibrosis: Secondary | ICD-10-CM

## 2013-12-15 NOTE — Progress Notes (Signed)
Quick Note:  Pt returned call. Informed pt of results and recs per MR. Pt verbalized understanding and denied any further questions or concerns at this time. ______

## 2013-12-15 NOTE — Progress Notes (Signed)
Quick Note:  lmtcb ______ 

## 2013-12-31 ENCOUNTER — Ambulatory Visit (INDEPENDENT_AMBULATORY_CARE_PROVIDER_SITE_OTHER): Payer: BC Managed Care – PPO | Admitting: Internal Medicine

## 2013-12-31 ENCOUNTER — Encounter: Payer: Self-pay | Admitting: Internal Medicine

## 2013-12-31 VITALS — BP 120/92 | HR 111 | Ht 69.0 in | Wt 203.0 lb

## 2013-12-31 DIAGNOSIS — J84112 Idiopathic pulmonary fibrosis: Secondary | ICD-10-CM

## 2013-12-31 DIAGNOSIS — M069 Rheumatoid arthritis, unspecified: Secondary | ICD-10-CM

## 2013-12-31 DIAGNOSIS — R0600 Dyspnea, unspecified: Secondary | ICD-10-CM | POA: Insufficient documentation

## 2013-12-31 MED ORDER — SULFAMETHOXAZOLE-TMP DS 800-160 MG PO TABS: 1.0000 | ORAL_TABLET | ORAL | Status: DC

## 2013-12-31 MED ORDER — MYCOPHENOLATE MOFETIL 500 MG PO TABS: ORAL_TABLET | ORAL | Status: DC

## 2013-12-31 NOTE — Progress Notes (Signed)
Subjective:    Patient ID: [name], male    DOB: 08/08/1950, 63 y.o.   MRN: [id number]  HPI   PC Galen Daft, MD Rheum: Dr Bo Merino  HPI  IOV 12/03/2013    Chief Complaint  Patient presents with  . Pulmonary Consult    Referred by Dr. Estanislado Pandy for ILD.    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 He is an extremely poor historian. Information is gathered from him, outside records, [facility] interstitial lung disease clinic 2013 notes  As best as I can tell patient has rheumatoid arthritis since approximately 2010. He is being treated with Celebrex and Humira for possibly the same amount of time. His symptoms are largely limited to the joints. Apparently these medications to control his joint pain. 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 He was [facility] at [facility] interstitial lung disease clinic by Dr. Wynn Maudlin. Last visit was recorded in the spring of 2013 ital capacity was 58% and diffusion capacity was 40% consistent with moderate interstitial lung disease. Patient himself reports moderate of cough and moderate amount of dyspnea. He says that both these symptoms only impacting his quality of life to a mild extent and is able to live well despite these symptoms. In the office he desaturated immediately after standing up but despite this he feels that his symptoms are impacting his quality of life a little mild extent.  He would not tell me why he switched from [facility] but it appears that probably because he lives alone  Review of this imaging shows that he's had pulmonary fibrosis at least as of 2005 which means it predates the diagnoses of rheumatoid arthritis which is typically the case in many men  CT scan of the chest 06/28/2010: In my opinion has classic UIP features with significant  honeycombing. This is very similar to a CT scan of the chest in September 2009 and February 2005 but likely worse compared to 2005  In terms of his rheumatoid arthritis according to outside notes his positive rheumatoid factor and positive CCP. He does not have any synovitis on exam as of July 2015.   Other issues    - . reports that he quit smoking about 5 years ago. He has never used smokeless tobacco.  - [facility] records suggest that he had right middle lobe nodule that had moderate activity on PET scan in 2013. These have not been followed up since then. 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 Follow-up    Pt states his breathing has improved since last visit. Pt states his SOB has improved. Pt c/o intermittent dry cough. Pt denies CP/tightness.    HEre to review results. Poor hx with RA and UIP. COncern is that UIP is progresslive.  HE feels fine. AGain, talks very little. Spirometry compared to 2013 shows progressive  disease; FVC now is 51% but was 58% in 2013. CT chest sept 2015 also shows progressive disease compared to 2012. HE says he does not feel the progression. HE is open to listening to my recommendations. He is willing to try cellcept to slow progression of disease    SPirro 12/31/2013  - fvc  2.3L/51%, fev1 1.99L/57%, ratio 87   CT chest 12/10/13: COMPARISON: Chest CT 06/28/2010 IMPRESSION:  1. The appearance of the chest is most compatible with progressively  worsening usual interstitial pneumonia (UIP), as discussed  above.   2. Multiple borderline enlarged and mildly enlarged mediastinal and  bilateral hilar lymph nodes are presumably benign and reactive in  the setting of chronic interstitial lung disease.   3. Atherosclerosis, including left circumflex coronary artery  disease. Please note that although the presence of coronary artery  calcium documents the presence of coronary artery disease, the  severity of this disease and any potential stenosis cannot be  assessed on this non-gated CT examination. Assessment for potential  risk factor modification, dietary therapy or pharmacologic therapy  may be warranted, if clinically indicated.  Electronically Signed  By: Vinnie Langton M.D.  On: 12/10/2013 15:26    Review of Systems  Constitutional: Negative for fever and unexpected weight change.  HENT: Negative for congestion, dental problem, ear pain, nosebleeds, postnasal drip, rhinorrhea, sinus pressure, sneezing, sore throat and trouble swallowing.   Eyes: Negative for redness and itching.  Respiratory: Positive for cough and shortness of breath. Negative for chest tightness and wheezing.   Cardiovascular: Negative for palpitations and leg swelling.  Gastrointestinal: Negative for nausea and vomiting.  Genitourinary: Negative for dysuria.  Musculoskeletal: Negative for joint swelling.  Skin: Negative for rash.  Neurological: Negative for headaches.  Hematological: Does not bruise/bleed easily.  Psychiatric/Behavioral: Negative for dysphoric mood. The patient is not nervous/anxious.    Current outpatient prescriptions:Adalimumab (HUMIRA) 20 MG/0.4ML KIT, As directed every 2 wks, Disp: , Rfl: ;  albuterol (VENTOLIN HFA) 108 (90 BASE) MCG/ACT inhaler, Inhale 2 puffs into the lungs every 4 (four) hours as needed for wheezing or shortness of breath., Disp: 8.5 each, Rfl: 11;  aspirin 81 MG tablet, Take 81 mg by mouth daily., Disp: , Rfl: ;  celecoxib (CELEBREX) 200 MG capsule, Take 200 mg by mouth  daily. , Disp: , Rfl:  gabapentin (NEURONTIN) 400 MG capsule, Take 800 mg by mouth 2 (two) times daily., Disp: , Rfl: ;  losartan (COZAAR) 50 MG tablet, TAKE 1 TABLET BY MOUTH ONCE DAILY FOR BLOOD PRESSURE, Disp: 90 tablet, Rfl: 1;  metFORMIN (GLUCOPHAGE) 1000 MG tablet, Take 1 tablet (1,000 mg total) by mouth 2 (two) times daily with a meal. FOR DIABETES, Disp: 180 tablet, Rfl: 1 pravastatin (PRAVACHOL) 40 MG tablet, Take 1 tablet (40 mg total) by mouth at bedtime. 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   - Oral suspension should not be mixed with other medications.   - Tablets should not be crushed, cut, or chewed.   - Cellcept may be administered with food in stable renal transplant patients when necessary.   - If a dose is missed, administer as soon as it is remembered.   - If it is close to the next scheduled dose, skip the missed dose and resume at next regularly scheduled time; do not double a dose to make up for a missed dos    - Start Bactrim 1 DS tablet Monday, Wed, Friday ; this is to prevent infection  - Will inform Dr Arlean Hopping your rheumatologist; at some point she can do th monitoring for cellcept   - Contnue humia per Dr Arlean Hopping   #Coronary artery calcifcation   - refer  cardiology   #lung nodule 2013 CT at [place]  - not seen in CT Sept 2015  #Followup  2 weeks with NP Tammy for blood work and to increase cellcept dosage   > 50% of this > 25 min visit spent in face to face counseling (15 min visit converted to 25 min)   Dr. Brand Males, M.D., [facility].C.P Pulmonary and Critical Care Medicine Staff Physician [facility] Pager: [phone], If no answer or between  15:00h - 7:00h: call [phone]  12/31/2013 2:04 PM

## 2013-12-31 NOTE — Patient Instructions (Addendum)
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   - Oral suspension should not be mixed with other medications.   - Tablets should not be crushed, cut, or chewed.   - Cellcept may be administered with food in stable renal transplant patients when necessary.   - If a dose is missed, administer as soon as it is remembered.   - If it is close to the next scheduled dose, skip the missed dose and resume at next regularly scheduled time; do not double a dose to make up for a missed dos    - Start Bactrim 1 DS tablet Monday, Wed, Friday ; this is to prevent infection  - Will inform Dr Arlean Hopping your rheumatologist   #Coronary artery calcifcation   - refer cardiology   #lung nodule 2013 CT at [place]  - not seen in CT Sept 2015  #Followup  2 weeks with NP Tammy for blood work and to increase cellcept dosage  #FOllowup

## 2014-01-12 ENCOUNTER — Telehealth: Payer: Self-pay | Admitting: Internal Medicine

## 2014-01-13 NOTE — Telephone Encounter (Signed)
lmtcb x1 

## 2014-01-14 ENCOUNTER — Ambulatory Visit (INDEPENDENT_AMBULATORY_CARE_PROVIDER_SITE_OTHER): Payer: BC Managed Care – PPO | Admitting: Adult Health

## 2014-01-14 ENCOUNTER — Encounter: Payer: Self-pay | Admitting: Adult Health

## 2014-01-14 DIAGNOSIS — J8410 Pulmonary fibrosis, unspecified: Secondary | ICD-10-CM

## 2014-01-14 NOTE — Telephone Encounter (Signed)
Called and spoke to pt. Pt stated the issues has been resolved and has received the needed medication. Pt stated nothing further needed. Will sign off.

## 2014-01-18 NOTE — Assessment & Plan Note (Signed)
Not seen visit rescheduled

## 2014-01-18 NOTE — Progress Notes (Signed)
Not seen visit rescheduled

## 2014-01-26 ENCOUNTER — Telehealth: Payer: Self-pay | Admitting: Internal Medicine

## 2014-01-26 NOTE — Telephone Encounter (Signed)
Called pt. He reports he received a call from our office. I do not see anything in EPIC. Pt is scheduled to see TP on 11/6. Jess did you try calling pt? thanks

## 2014-01-27 ENCOUNTER — Ambulatory Visit (INDEPENDENT_AMBULATORY_CARE_PROVIDER_SITE_OTHER): Payer: BC Managed Care – PPO | Admitting: Emergency Medicine

## 2014-01-27 ENCOUNTER — Ambulatory Visit (INDEPENDENT_AMBULATORY_CARE_PROVIDER_SITE_OTHER): Payer: BC Managed Care – PPO

## 2014-01-27 VITALS — BP 110/66 | HR 100 | Temp 97.3°F | Resp 22 | Ht 68.75 in | Wt 202.0 lb

## 2014-01-27 DIAGNOSIS — J209 Acute bronchitis, unspecified: Secondary | ICD-10-CM

## 2014-01-27 DIAGNOSIS — R0602 Shortness of breath: Secondary | ICD-10-CM

## 2014-01-27 DIAGNOSIS — R0989 Other specified symptoms and signs involving the circulatory and respiratory systems: Secondary | ICD-10-CM

## 2014-01-27 LAB — POCT CBC
Granulocyte percent: 69.5 %G (ref 37–80)
HEMATOCRIT: 42.5 % — AB (ref 43.5–53.7)
Hemoglobin: 13.5 g/dL — AB (ref 14.1–18.1)
Lymph, poc: 4.4 — AB (ref 0.6–3.4)
MCH, POC: 29.7 pg (ref 27–31.2)
MCHC: 31.8 g/dL (ref 31.8–35.4)
MCV: 93.5 fL (ref 80–97)
MID (CBC): 0.8 (ref 0–0.9)
MPV: 7.1 fL (ref 0–99.8)
PLATELET COUNT, POC: 381 10*3/uL (ref 142–424)
POC Granulocyte: 11.9 — AB (ref 2–6.9)
POC LYMPH PERCENT: 25.8 %L (ref 10–50)
POC MID %: 4.7 %M (ref 0–12)
RBC: 4.54 M/uL — AB (ref 4.69–6.13)
RDW, POC: 13.3 %
WBC: 17.1 10*3/uL — AB (ref 4.6–10.2)

## 2014-01-27 MED ORDER — LEVOFLOXACIN 500 MG PO TABS: 500.0000 mg | ORAL_TABLET | Freq: Every day | ORAL | Status: DC

## 2014-01-27 MED ORDER — ALBUTEROL SULFATE (2.5 MG/3ML) 0.083% IN NEBU
2.5000 mg | INHALATION_SOLUTION | Freq: Once | RESPIRATORY_TRACT | Status: AC
  Administered 2014-01-27: 2.5 mg via RESPIRATORY_TRACT

## 2014-01-27 MED ORDER — IPRATROPIUM BROMIDE 0.02 % IN SOLN
0.5000 mg | Freq: Once | RESPIRATORY_TRACT | Status: AC
  Administered 2014-01-27: 0.5 mg via RESPIRATORY_TRACT

## 2014-01-27 NOTE — Progress Notes (Signed)
Subjective:    Patient ID: [name], male    DOB: 07/29/1950, 63 y.o.   MRN: [id number]  HPI Patient with PMH of pulmonary fibrosis presents with 1 day of productive cough. Associated with nasal/chest congestion, feeling hot, having chills, and rhinorrhea. Denies wheezing, but says sounds like potatoe chips when breathing. Denies SOB, DOE, CP, myalgias, or fatigue. Is on 2L of oxygen at home. Has home nebulizer at home, but is not using it at this time. Has not taken other meds for relief. Only came in to get antibiotics.      Review of Systems  Constitutional: Positive for fever, chills and diaphoresis. Negative for fatigue.  HENT: Positive for congestion and rhinorrhea. Negative for ear discharge, ear pain, sinus pressure, sneezing and sore throat.   Eyes: Negative for discharge and itching.  Respiratory: Positive for cough (productive). Negative for chest tightness, shortness of breath (No DOE) and wheezing.        Abnormal breathe sound  Cardiovascular: Negative for chest pain and palpitations.  Gastrointestinal: Negative for nausea, vomiting and abdominal pain.  Musculoskeletal: Negative for myalgias, neck pain and neck stiffness.  Neurological: Negative for dizziness, light-headedness and headaches.  Hematological: Negative for adenopathy.       Objective:   Physical Exam  Constitutional: He is oriented to person, place, and time. He appears well-developed and well-nourished. No distress.  HENT:  Head: Normocephalic and atraumatic.  Right Ear: External ear normal.  Left Ear: External ear normal.  Nose: Mucosal edema and rhinorrhea present. No sinus tenderness. Right sinus exhibits no maxillary sinus tenderness and no frontal sinus tenderness. Left sinus exhibits no maxillary sinus tenderness and no frontal sinus tenderness.  Mouth/Throat: Uvula is midline and mucous membranes are normal. No oropharyngeal exudate, posterior oropharyngeal edema or posterior oropharyngeal  erythema.  Eyes: Conjunctivae are normal. Pupils are equal, round, and reactive to light. Right eye exhibits no discharge. Left eye exhibits no discharge. No scleral icterus.  Neck: Normal range of motion. Neck supple.  Cardiovascular: Normal rate, regular rhythm and normal heart sounds.  Exam reveals no gallop and no friction rub.   No murmur heard. Pulmonary/Chest: Effort normal. No tachypnea and no bradypnea. He has no decreased breath sounds. He has wheezes in the right upper field, the right middle field, the left upper field, the left middle field and the left lower field. He has no rhonchi. He has rales in the right upper field, the right middle field, the left upper field and the left middle field.  On 2L while performing examination   Abdominal: Soft. Bowel sounds are normal. He exhibits no mass. There is no tenderness.  Lymphadenopathy:    He has no cervical adenopathy.  Neurological: He is alert and oriented to person, place, and time.  Skin: Skin is warm. No rash noted. He is diaphoretic. No erythema. No pallor.     Results for orders placed or performed in visit on 01/27/14  POCT CBC  Result Value Ref Range   WBC 17.1 (A) 4.6 - 10.2 K/uL   Lymph, poc 4.4 (A) 0.6 - 3.4   POC LYMPH PERCENT 25.8 10 - 50 %L   MID (cbc) 0.8 0 - 0.9   POC MID % 4.7 0 - 12 %M   POC Granulocyte 11.9 (A) 2 - 6.9   Granulocyte percent 69.5 37 - 80 %G   RBC 4.54 (A) 4.69 - 6.13 M/uL   Hemoglobin 13.5 (A) 14.1 - 18.1 g/dL   HCT,  POC 42.5 (A) 43.5 - 53.7 %   MCV 93.5 80 - 97 fL   MCH, POC 29.7 27 - 31.2 pg   MCHC 31.8 31.8 - 35.4 g/dL   RDW, POC 13.3 %   Platelet Count, POC 381 142 - 424 K/uL   MPV 7.1 0 - 99.8 fL   Pulse ox 87% on arrival. Increased to 95% while on 2L of oxygen.  Neb: atrovent and albuterol.  [facility] reading (PRIMARY) by  Dr. Ouida Sills. Diffuse pulmonary fibrosis.      Assessment & Plan:  1. Acute bronchitis, unspecified organism - levofloxacin (LEVAQUIN) 500 MG tablet; Take  1 tablet (500 mg total) by mouth daily.  Dispense: 7 tablet; Refill: 0 - can use mucinex for nasal congestion. - plenty of fluid and rest.    2. Lung crackles/wheezing Moderate improvement after first nebulized treatment. Second treatment refused. Patient agrees to take treatment at home every 4 hours today and every 4-6 hours for the next few days.  - albuterol (PROVENTIL) (2.5 MG/3ML) 0.083% nebulizer solution 2.5 mg; Take 3 mLs (2.5 mg total) by nebulization once. - ipratropium (ATROVENT) nebulizer solution 0.5 mg; Take 2.5 mLs (0.5 mg total) by nebulization once. - POCT CBC - DG Chest 2 View; Future - levofloxacin (LEVAQUIN) 500 MG tablet; Take 1 tablet (500 mg total) by mouth daily.  Dispense: 7 tablet; Refill: 0    Kenner Lewan PA-C  [facility] 01/27/2014 5:25 PM

## 2014-01-27 NOTE — Patient Instructions (Signed)
1. Take Nebulizer every 4 hours today and every 4-6 hours tomorrow.  2. Can mucinex for nasal congestion.

## 2014-01-27 NOTE — Telephone Encounter (Signed)
I have not called pt Called spoke with patient to discuss - nothing further is needed.  Will sign off.

## 2014-01-29 ENCOUNTER — Encounter: Payer: Self-pay | Admitting: Adult Health

## 2014-01-29 ENCOUNTER — Other Ambulatory Visit (INDEPENDENT_AMBULATORY_CARE_PROVIDER_SITE_OTHER): Payer: BC Managed Care – PPO

## 2014-01-29 ENCOUNTER — Ambulatory Visit (INDEPENDENT_AMBULATORY_CARE_PROVIDER_SITE_OTHER): Payer: BC Managed Care – PPO | Admitting: Adult Health

## 2014-01-29 VITALS — BP 96/66 | HR 114 | Ht 69.0 in | Wt 203.0 lb

## 2014-01-29 DIAGNOSIS — J8410 Pulmonary fibrosis, unspecified: Secondary | ICD-10-CM

## 2014-01-29 LAB — CBC WITH DIFFERENTIAL/PLATELET
BASOS PCT: 0.4 % (ref 0.0–3.0)
Basophils Absolute: 0.1 10*3/uL (ref 0.0–0.1)
EOS ABS: 1 10*3/uL — AB (ref 0.0–0.7)
EOS PCT: 7.3 % — AB (ref 0.0–5.0)
HCT: 41.5 % (ref 39.0–52.0)
Hemoglobin: 13.3 g/dL (ref 13.0–17.0)
LYMPHS PCT: 13.4 % (ref 12.0–46.0)
Lymphs Abs: 1.9 10*3/uL (ref 0.7–4.0)
MCHC: 32.1 g/dL (ref 30.0–36.0)
MCV: 92.5 fl (ref 78.0–100.0)
Monocytes Absolute: 1.1 10*3/uL — ABNORMAL HIGH (ref 0.1–1.0)
Monocytes Relative: 7.6 % (ref 3.0–12.0)
Neutro Abs: 10.1 10*3/uL — ABNORMAL HIGH (ref 1.4–7.7)
Neutrophils Relative %: 71.3 % (ref 43.0–77.0)
Platelets: 351 10*3/uL (ref 150.0–400.0)
RBC: 4.49 Mil/uL (ref 4.22–5.81)
RDW: 13.7 % (ref 11.5–15.5)
WBC: 14.2 10*3/uL — AB (ref 4.0–10.5)

## 2014-01-29 LAB — HEPATIC FUNCTION PANEL
ALT: 23 U/L (ref 0–53)
AST: 26 U/L (ref 0–37)
Albumin: 3.5 g/dL (ref 3.5–5.2)
Alkaline Phosphatase: 85 U/L (ref 39–117)
BILIRUBIN DIRECT: 0.1 mg/dL (ref 0.0–0.3)
TOTAL PROTEIN: 8.2 g/dL (ref 6.0–8.3)
Total Bilirubin: 0.4 mg/dL (ref 0.2–1.2)

## 2014-01-29 NOTE — Assessment & Plan Note (Signed)
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  For now no change in dose -w/ acute infection. Check labs today with cbc and lft   Plan   - Continue mycophenolate mofetil 500mg  twice a day     - Continue  Bactrim 1 DS tablet Monday, Wed, Friday ; this is to prevent infection  -Labs today .   Follow up Dr. Chase Caller in 4-6 weeks and As needed

## 2014-01-29 NOTE — Patient Instructions (Signed)
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   Follow up Dr. Chase Caller in 4-6 weeks and As needed

## 2014-01-29 NOTE — Progress Notes (Signed)
Subjective:    Patient ID: [name], male    DOB: 08/08/1950, 63 y.o.   MRN: [id number]  HPI   PC Galen Daft, MD Rheum: Dr Bo Merino  HPI  IOV 12/03/2013    Chief Complaint  Patient presents with  . Pulmonary Consult    Referred by Dr. Estanislado Pandy for ILD.    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 He is an extremely poor historian. Information is gathered from him, outside records, [facility] interstitial lung disease clinic 2013 notes  As best as I can tell patient has rheumatoid arthritis since approximately 2010. He is being treated with Celebrex and Humira for possibly the same amount of time. His symptoms are largely limited to the joints. Apparently these medications to control his joint pain. 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 He was [facility] at [facility] lung [facility] by Dr. Wynn Maudlin. Last visit was recorded in the spring of 2013 ital capacity was 58% and diffusion capacity was 40% consistent with moderate interstitial lung disease. Patient himself reports moderate of cough and moderate amount of dyspnea. He says that both these symptoms only impacting his quality of life to a mild extent and is able to live well despite these symptoms. In the office he desaturated immediately after standing up but despite this he feels that his symptoms are impacting his quality of life a little mild extent.  He would not tell me why he switched from [facility] but it appears that probably because he lives alone  Review of this imaging shows that he's had pulmonary fibrosis at least as of 2005 which means it predates the diagnoses of rheumatoid arthritis which is typically the case in many men  CT scan of the chest 06/28/2010: In my opinion has classic UIP features with significant  honeycombing. This is very similar to a CT scan of the chest in September 2009 and February 2005 but likely worse compared to 2005  In terms of his rheumatoid arthritis according to outside notes his positive rheumatoid factor and positive CCP. He does not have any synovitis on exam as of July 2015.   Other issues    - . reports that he quit smoking about 5 years ago. He has never used smokeless tobacco.  - [facility] records suggest that he had right middle lobe nodule that had moderate activity on PET scan in 2013. These have not been followed up since then. 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 Follow-up    Pt states his breathing has improved since last visit. Pt states his SOB has improved. Pt c/o intermittent dry cough. Pt denies CP/tightness.    HEre to review results. Poor hx with RA and UIP. COncern is that UIP is progresslive.  HE feels fine. AGain, talks very little. Spirometry compared to 2013 shows progressive  disease; FVC now is 51% but was 58% in 2013. CT chest sept 2015 also shows progressive disease compared to 2012. HE says he does not feel the progression. HE is open to listening to my recommendations. He is willing to try cellcept to slow progression of disease    SPirro 12/31/2013  - fvc  2.3L/51%, fev1 1.99L/57%, ratio 87   CT chest 12/10/13: COMPARISON: Chest CT 06/28/2010 IMPRESSION:  1. The appearance of the chest is most compatible with progressively  worsening usual interstitial pneumonia (UIP), as discussed  above.   2. Multiple borderline enlarged and mildly enlarged mediastinal and  bilateral hilar lymph nodes are presumably benign and reactive in  the setting of chronic interstitial lung disease.   3. Atherosclerosis, including left circumflex coronary artery  disease. Please note that although the presence of coronary artery  calcium documents the presence of coronary artery disease, the  severity of this disease and any potential stenosis cannot be  assessed on this non-gated CT examination. Assessment for potential  risk factor modification, dietary therapy or pharmacologic therapy  may be warranted, if clinically indicated.  Electronically Signed  By: Vinnie Langton M.D.  On: 12/10/2013 15:26  01/29/2014 Follow up (Rheumatoid arthritis related UIP type of pulmonary fibrosis.) Returns for follow up .  Started on Cellcept last ov, has been on for last 1-2 weeks, took a while to  [employer].  Was seen 2 days ago by PCP dx w/ bronchitis , tx w/ Levaquin  CXR w/ no acute changes.  Says cough and congestion are getting better Denies chest pain, orthopnea, edema or fever.         Review of Systems  Constitutional: Negative for fever and unexpected weight change.  HENT: Negative for congestion, dental problem, ear pain, nosebleeds, postnasal drip, rhinorrhea, sinus pressure, sneezing, sore throat and trouble swallowing.   Eyes: Negative for redness and itching.  Respiratory: Positive for cough and shortness of breath. Negative for chest tightness and wheezing.   Cardiovascular: Negative for palpitations and leg swelling.  Gastrointestinal: Negative for nausea and vomiting.  Genitourinary: Negative for dysuria.  Musculoskeletal: Negative for joint swelling.  Skin: Negative for rash.  Neurological: Negative for headaches.  Hematological: Does not bruise/bleed easily.  Psychiatric/Behavioral: Negative for dysphoric mood. The patient is not nervous/anxious.     Objective:   Physical Exam GEN: A/Ox3; pleasant , NAD, dishelved   HEENT:  [place]/AT,  EACs-clear, TMs-wnl, NOSE-clear, THROAT-clear, no lesions, no postnasal drip or exudate noted.   NECK:  Supple w/ fair ROM; no JVD; normal carotid impulses w/o bruits; no thyromegaly or nodules palpated; no lymphadenopathy.  RESP  Bibasilar crackles no accessory muscle use, no dullness to percussion  CARD:  RRR, no m/r/g  , no peripheral edema, pulses intact, no cyanosis or clubbing.  GI:   Soft & nt; nml bowel sounds; no organomegaly or masses detected.  Musco: Warm bil, no deformities or joint swelling noted.   Neuro: alert, no focal deficits noted.    Skin: Warm, no lesions or rashes      Assessment & Plan:

## 2014-01-29 NOTE — Addendum Note (Signed)
Addended by: Parke Poisson E on: 01/29/2014 03:18 PM   Modules accepted: Orders

## 2014-02-02 ENCOUNTER — Ambulatory Visit (INDEPENDENT_AMBULATORY_CARE_PROVIDER_SITE_OTHER): Payer: BC Managed Care – PPO | Admitting: Cardiovascular Disease

## 2014-02-02 ENCOUNTER — Encounter: Payer: Self-pay | Admitting: Cardiovascular Disease

## 2014-02-02 VITALS — BP 100/74 | HR 101 | Ht 69.0 in | Wt 195.4 lb

## 2014-02-02 DIAGNOSIS — I2584 Coronary atherosclerosis due to calcified coronary lesion: Secondary | ICD-10-CM

## 2014-02-02 DIAGNOSIS — I2510 Atherosclerotic heart disease of native coronary artery without angina pectoris: Secondary | ICD-10-CM

## 2014-02-02 DIAGNOSIS — I10 Essential (primary) hypertension: Secondary | ICD-10-CM

## 2014-02-02 NOTE — Patient Instructions (Addendum)
Your physician recommends that you schedule a follow-up appointment in: as needed with Dr. Acie Fredrickson  Your physician recommends that you continue on your current medications as directed. Please refer to the Current Medication list given to you today.

## 2014-02-02 NOTE — Assessment & Plan Note (Signed)
[name] presents for evaluation of asymptomatic coronary artery calcification. He has severe lung disease and is quite limited by his lung disease. He has a hx of hyperlipidemia but his lipids are well controlled on his current medication ( pravachol)  At this point, he is very limited by his pulmonary issues and does not have any cardiac complaints.   I encouraged him to continue with his current meds.   I do not think he needs any further cardiac testing given his lack of symptoms and the fact that he has severe lung disease .  I have told him that his lung issues are going to be his biggest challenge in his life.     We will see him on an as needed basis.

## 2014-02-02 NOTE — Progress Notes (Signed)
[name] Date of Birth  [birth year]       [facility]    [employer] [address], [address], [place], [place]  [address]   [place],   [address] [address]   Fax  [phone]     Fax [phone]  Problem List: 1. Rheumatoid arthritis 2. Pulmonary fibrosis related to rheumatoid arthritis  3. Incidentally noted coronary calcifications  History of Present Illness:  [name] is a 63 yo with history of rheumatoid arthritis with pulmonary fibrosis. He had a CT scan of his lungs and was incidentally noted to have coronary calcifications involving the left circumflex artery. He has no coronary symptoms. He denies any angina. He is chronically short of breath because of his lung disease.  Patient is a generally poor historian.   He does not having chest discomfort with exercise. 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 He's now limited by severe pulmonary disease. He denies any syncope or presyncope. He denies any PND or orthopnea. He does have chronic shortness breath.    he coughs frequently.  Current Outpatient Prescriptions on File Prior to Visit  Medication Sig Dispense Refill  . Adalimumab (HUMIRA) 20 MG/0.4ML KIT As directed every 2 wks    . albuterol (VENTOLIN HFA) 108 (90 BASE) MCG/ACT inhaler Inhale 2 puffs into the lungs every 4 (four) hours as needed for wheezing or shortness of breath. 8.5 each 11  . aspirin 81 MG tablet Take 81 mg by mouth daily.    . celecoxib (CELEBREX) 200 MG capsule Take 200 mg by mouth daily.     Marland Kitchen gabapentin (NEURONTIN) 400 MG capsule Take 800 mg by mouth 2 (two) times daily.    Marland Kitchen HUMIRA PEN 40 MG/0.8ML PNKT Inject 40 mg into the muscle every 21 ( twenty-one) days.  1  . levofloxacin (LEVAQUIN) 500 MG tablet Take 1 tablet (500 mg total) by mouth daily. 7 tablet 0  . losartan (COZAAR) 50 MG tablet TAKE 1 TABLET BY MOUTH ONCE DAILY FOR BLOOD PRESSURE 90 tablet 1   . metFORMIN (GLUCOPHAGE) 1000 MG tablet Take 1 tablet (1,000 mg total) by mouth 2 (two) times daily with a meal. FOR DIABETES 180 tablet 1  . mycophenolate (CELLCEPT) 500 MG tablet Take 1 tablet twice daily. Tablets need to be taken either 1 hour BEFORE meals or 2 hours AFTER meals. 60 tablet 1  . pravastatin (PRAVACHOL) 40 MG tablet Take 1 tablet (40 mg total) by mouth at bedtime. FOR CHOLESTEROL 90 tablet 1  . sulfamethoxazole-trimethoprim (BACTRIM DS) 800-160 MG per tablet Take 1 tablet by mouth every Monday, Wednesday, and Friday. 12 tablet 1   No current facility-administered medications on file prior to visit.    No Known Allergies  Past Medical History  Diagnosis Date  . Rheumatoid arthritis(714.0) 2005  . Pulmonary fibrosis   . Hypertension   . GERD (gastroesophageal reflux disease)   . Diabetes mellitus without complication   . Dyspnea     Past Surgical History  Procedure Laterality Date  . Excision mass r lateral arm 2010  2010  . Mass excision  03/08/2011    Procedure: EXCISION MASS;  Surgeon: Cammie Sickle., MD;  Location: [facility];  Service: Orthopedics;  Laterality: Right;  excision rheumatoid nodule right ulna    History  Smoking status  . Former Smoker -- 0.50 packs/day  for 30 years  . Quit date: 01/25/2008  Smokeless tobacco  . Never Used    History  Alcohol Use No    No family history on file.  Reviw of Systems:  Reviewed in the HPI.  All other systems are negative.  Physical Exam: Blood pressure 100/74, pulse 101, height '5\' 9"'  (1.753 m), weight 195 lb 6.4 oz (88.633 kg). Wt Readings from Last 3 Encounters:  02/02/14 195 lb 6.4 oz (88.633 kg)  01/29/14 203 lb (92.08 kg)  01/27/14 202 lb (91.627 kg)     General:  He's a disheveled, unkempt gentleman. His clothes smell of  Cat Feces.  Head: Normocephalic, atraumatic, sclera non-icteric, mucus membranes are moist,   Neck: Supple. Carotids are 2 + without bruits. No JVD    Lungs: Clear   Heart: RR, normal S1S2, no murmurs  Abdomen: Soft, non-tender, non-distended with normal bowel sounds.  Msk:  Strength and tone are normal   Extremities: No clubbing or cyanosis. No edema.  Distal pedal pulses are 2+ and equal    Neuro: CN II - XII intact.  Alert and oriented X 3.   Psych:  Normal   ECG: Nov. 10, 2015:  Sinus tach at 101.  Otherwise normal ECG   Assessment / Plan:

## 2014-02-04 NOTE — Progress Notes (Signed)
Quick Note:  LMOM TCB x1. ______ 

## 2014-02-05 NOTE — Progress Notes (Signed)
Quick Note:  Patient returned call. Advised of lab results / recs as stated by TP. Pt verbalized understanding and denied any questions. ______ 

## 2014-02-19 ENCOUNTER — Encounter: Payer: BC Managed Care – PPO | Admitting: Family Medicine

## 2014-02-26 ENCOUNTER — Other Ambulatory Visit: Payer: Self-pay | Admitting: Internal Medicine

## 2014-03-04 ENCOUNTER — Other Ambulatory Visit: Payer: Self-pay | Admitting: Family Medicine

## 2014-03-11 ENCOUNTER — Telehealth: Payer: Self-pay | Admitting: Internal Medicine

## 2014-03-11 ENCOUNTER — Telehealth: Payer: Self-pay

## 2014-03-11 NOTE — Telephone Encounter (Signed)
Call from Dr Bo Merino - patient doing well. PAtient only RA -ILD. Not much joint disease. So she will slowly stop humira. Moving forward I/we will do cellcept monitoring  Please oder for mid-jan 2016 - cbc, bmet, lft with OV at that time with me or TP    Thanks  Dr. Brand Males, M.D., [facility].C.P Pulmonary and Critical Care Medicine Staff Physician [place] [facility] Pager: [phone], If no answer or between  15:00h - 7:00h: call [phone]  03/11/2014 11:02 AM

## 2014-03-11 NOTE — Telephone Encounter (Signed)
Called and spoke to pt. Informed pt of the recs per MR. Pt verbalized understanding and denied any further questions or concerns at this time.  

## 2014-03-11 NOTE — Telephone Encounter (Signed)
LMVM reminding patient to get his flu shot.

## 2014-03-12 ENCOUNTER — Ambulatory Visit (INDEPENDENT_AMBULATORY_CARE_PROVIDER_SITE_OTHER): Payer: Medicare Other

## 2014-03-12 ENCOUNTER — Encounter: Payer: Self-pay | Admitting: Family Medicine

## 2014-03-12 ENCOUNTER — Ambulatory Visit (INDEPENDENT_AMBULATORY_CARE_PROVIDER_SITE_OTHER): Payer: BC Managed Care – PPO | Admitting: Family Medicine

## 2014-03-12 VITALS — BP 112/72 | HR 106 | Resp 18 | Ht 68.25 in | Wt 201.4 lb

## 2014-03-12 DIAGNOSIS — R0602 Shortness of breath: Secondary | ICD-10-CM

## 2014-03-12 DIAGNOSIS — J84112 Idiopathic pulmonary fibrosis: Secondary | ICD-10-CM

## 2014-03-12 DIAGNOSIS — J8410 Pulmonary fibrosis, unspecified: Secondary | ICD-10-CM

## 2014-03-12 DIAGNOSIS — E1165 Type 2 diabetes mellitus with hyperglycemia: Secondary | ICD-10-CM

## 2014-03-12 DIAGNOSIS — E785 Hyperlipidemia, unspecified: Secondary | ICD-10-CM

## 2014-03-12 DIAGNOSIS — I2510 Atherosclerotic heart disease of native coronary artery without angina pectoris: Secondary | ICD-10-CM

## 2014-03-12 DIAGNOSIS — IMO0002 Reserved for concepts with insufficient information to code with codable children: Secondary | ICD-10-CM

## 2014-03-12 DIAGNOSIS — I2584 Coronary atherosclerosis due to calcified coronary lesion: Secondary | ICD-10-CM

## 2014-03-12 DIAGNOSIS — I10 Essential (primary) hypertension: Secondary | ICD-10-CM

## 2014-03-12 LAB — COMPLETE METABOLIC PANEL WITH GFR
ALBUMIN: 4.2 g/dL (ref 3.5–5.2)
ALK PHOS: 101 U/L (ref 39–117)
ALT: 13 U/L (ref 0–53)
AST: 14 U/L (ref 0–37)
BUN: 28 mg/dL — ABNORMAL HIGH (ref 6–23)
CO2: 23 mEq/L (ref 19–32)
Calcium: 9.7 mg/dL (ref 8.4–10.5)
Chloride: 104 mEq/L (ref 96–112)
Creat: 1.12 mg/dL (ref 0.50–1.35)
GFR, EST NON AFRICAN AMERICAN: 69 mL/min
GFR, Est African American: 79 mL/min
GLUCOSE: 97 mg/dL (ref 70–99)
POTASSIUM: 5.1 meq/L (ref 3.5–5.3)
Sodium: 138 mEq/L (ref 135–145)
Total Bilirubin: 0.5 mg/dL (ref 0.2–1.2)
Total Protein: 8 g/dL (ref 6.0–8.3)

## 2014-03-12 LAB — LACTIC ACID, PLASMA: LACTIC ACID: 1.7 mmol/L (ref 0.5–2.2)

## 2014-03-12 LAB — POCT CBC
GRANULOCYTE PERCENT: 63 % (ref 37–80)
HEMATOCRIT: 40 % — AB (ref 43.5–53.7)
Hemoglobin: 13.1 g/dL — AB (ref 14.1–18.1)
Lymph, poc: 4.5 — AB (ref 0.6–3.4)
MCH, POC: 30.1 pg (ref 27–31.2)
MCHC: 32.7 g/dL (ref 31.8–35.4)
MCV: 92.3 fL (ref 80–97)
MID (cbc): 0.4 (ref 0–0.9)
MPV: 7.4 fL (ref 0–99.8)
POC GRANULOCYTE: 8.5 — AB (ref 2–6.9)
POC LYMPH %: 33.7 % (ref 10–50)
POC MID %: 3.3 %M (ref 0–12)
Platelet Count, POC: 391 10*3/uL (ref 142–424)
RBC: 4.33 M/uL — AB (ref 4.69–6.13)
RDW, POC: 13.9 %
WBC: 13.5 10*3/uL — AB (ref 4.6–10.2)

## 2014-03-12 LAB — POCT GLYCOSYLATED HEMOGLOBIN (HGB A1C): Hemoglobin A1C: 6.1

## 2014-03-12 LAB — GLUCOSE, POCT (MANUAL RESULT ENTRY): POC GLUCOSE: 107 mg/dL — AB (ref 70–99)

## 2014-03-12 LAB — POCT SEDIMENTATION RATE: POCT SED RATE: 101 mm/h — AB (ref 0–22)

## 2014-03-12 MED ORDER — PREDNISONE 20 MG PO TABS: 40.0000 mg | ORAL_TABLET | Freq: Every day | ORAL | Status: DC

## 2014-03-12 MED ORDER — AZITHROMYCIN 500 MG PO TABS: 500.0000 mg | ORAL_TABLET | Freq: Every day | ORAL | Status: DC

## 2014-03-12 MED ORDER — CEFTRIAXONE SODIUM 1 G IJ SOLR
1.0000 g | Freq: Once | INTRAMUSCULAR | Status: AC
  Administered 2014-03-12: 1 g via INTRAMUSCULAR

## 2014-03-12 MED ORDER — AMOXICILLIN-POT CLAVULANATE ER 1000-62.5 MG PO TB12: 2.0000 | ORAL_TABLET | Freq: Two times a day (BID) | ORAL | Status: DC

## 2014-03-12 MED ORDER — IPRATROPIUM-ALBUTEROL 0.5-2.5 (3) MG/3ML IN SOLN: 3.0000 mL | RESPIRATORY_TRACT | Status: DC | PRN

## 2014-03-12 MED ORDER — METHYLPREDNISOLONE SODIUM SUCC 125 MG IJ SOLR
125.0000 mg | Freq: Once | INTRAMUSCULAR | Status: AC
  Administered 2014-03-12: 125 mg via INTRAMUSCULAR

## 2014-03-12 NOTE — Patient Instructions (Addendum)
Start taking the prednisone TOMORROW morning - you were given an injection of it today. Start taking the AZITHROMYCIN  TODAY for 3 days. Start taking the AUGMENTIN TOMORROW for 10 days. Recheck in clinic on Sunday - in 48 hours - unless you are getting worse - then please come back sooner. Use your nebulizer machine every 4 hours. Check your blood sugars 2 hours after each meal and in the morning - they will be going up with the medication.  Idiopathic Pulmonary Fibrosis Idiopathic pulmonary fibrosis is an inflammation (soreness and irritation) in the lungs which eventually causes scarring. This usually shows in middle age over a several year time period. The cause is unknown (idiopathic). Usually death occurs after several years but there are no time tables which will predict perfectly what the course of the illness will be. It affects males and females equally. In this condition there is a formation of fibrous (tough leathery) tissue in the small ducts which carry air to and from your lungs. Because of this, the lungs do not work as well as they should for taking in oxygen from the air you breathe and getting rid of wastes (carbon dioxide). Because the lungs are damaged, there may be more problems with infections or the heart.  Other problems may include pulmonary hypertension (high blood pressure in the lungs), and formation of blood clots. Some symptoms of this illness are:  Coughing and breathing difficulties; cough is usually dry and hacking.  Bluish (cyanotic) skin and lips due to lack of circulating oxygen.  Loss of appetite.  Loss of strength which comes from just the increased work of breathing.  Rapid, shallow breathing occur with moderate exercise and later even while resting.  Increasing shortness of breath (dyspnea) which progresses as the disease gets worse.  Weight loss and fatigue due partly to the increased work of breathing.  Clubbing of the fingers (the ends of the  fingers become rounded and enlarged). DIAGNOSIS  A diagnosis of idiopathic pulmonary fibrosis may be suspected based on exam and a patient's history.  Specialized X-rays, pulmonary function tests, pulse oximetry, and laboratory tests including blood gasses help confirm the problem.  Sometimes a biopsy is done and a small piece of lung tissue is removed. This may be done through a bronchoscope or during an operation in which the chest is opened. This is looked at under a microscope by a specialist who can tell what the lung problem is. CAUSES If the cause of pulmonary fibrosis is known, it is no longer known as idiopathic. Several causes of pulmonary fibrosis include:  Occupational and environmental exposures to asbestos, silica, and or metal dusts.  Illegal or street drug use.  Agricultural workers may inhale substances, such as moldy hay, which can cause an allergic reaction in the lung. This reaction is called Farmer's Lung and can cause pulmonary fibrosis. Some other fumes found on farms are directly toxic to the lungs.  Exposure of the lungs to radiation.  Collagen diseases.  Sarcoidosis is a disease which forms granulomas (areas of inflammatory cells), which can attack any area of the body but most frequently affects the lungs.  Drugs. Certain medicines may have the undesirable side effect of causing pulmonary fibrosis. Check with your doctor about the medicines you are taking and ask about any possible side effects.  Some cases of pulmonary fibrosis seem to be genetic. TREATMENT   There are no drugs currently approved for the treatment of pulmonary fibrosis. Steroids (a potent medication which  cuts down on inflammation) are sometimes given to prevent lung changes before they become permanent. High doses may be recommended at first, followed by lower maintenance dosages. Other medications may be tried if steroids do not work.  Lung disease may be monitored with X-rays and laboratory  work.  Oxygen may be helpful if oxygen in the blood is diminished. This improves the quality of life. Your caregiver will give you a prescription for this if it is helpful.  Antibiotics are used for treatment of infections.  Exercise may be beneficial.  Lung transplants are being investigated and a single lung transplant may be considered for some patients.  Influenza vaccine and pneumococcal pneumonia vaccine are both recommended for people with IPF or any lung disease. These two shots may help keep you healthy. Document Released: 06/02/2003 Document Revised: 06/04/2011 Document Reviewed: 03/12/2005 [facility] Patient Information 2015 [place], [place]. This information is not intended to replace advice given to you by your health care provider. Make sure you discuss any questions you have with your health care provider.

## 2014-03-12 NOTE — Progress Notes (Addendum)
Subjective:    Patient ID: [name], male    DOB: [birth year], 63 y.o.   MRN: [id number] This chart was scribed for [name], MD by Marti Sleigh, Medical Scribe. This patient was seen in Room 27 and the patient's care was started a 9:21 AM.  Chief Complaint  Patient presents with  . Diabetes  . Bronchitis    HPI  Past Medical History  Diagnosis Date  . Rheumatoid arthritis(714.0) 2005  . Pulmonary fibrosis   . Hypertension   . GERD (gastroesophageal reflux disease)   . Diabetes mellitus without complication   . Dyspnea    No Known Allergies Current Outpatient Prescriptions on File Prior to Visit  Medication Sig Dispense Refill  . Adalimumab (HUMIRA) 20 MG/0.4ML KIT As directed every 2 wks    . albuterol (VENTOLIN HFA) 108 (90 BASE) MCG/ACT inhaler Inhale 2 puffs into the lungs every 4 (four) hours as needed for wheezing or shortness of breath. 8.5 each 11  . aspirin 81 MG tablet Take 81 mg by mouth daily.    . celecoxib (CELEBREX) 200 MG capsule Take 200 mg by mouth daily.     Marland Kitchen gabapentin (NEURONTIN) 400 MG capsule Take 800 mg by mouth 2 (two) times daily.    Marland Kitchen glucose blood (ONE TOUCH ULTRA TEST) test strip Test blood sugar daily. Dx code: E11.65 100 each 2  . HUMIRA PEN 40 MG/0.8ML PNKT Inject 40 mg into the muscle every 21 ( twenty-one) days.  1  . losartan (COZAAR) 50 MG tablet TAKE 1 TABLET BY MOUTH ONCE DAILY FOR BLOOD PRESSURE 90 tablet 1  . metFORMIN (GLUCOPHAGE) 1000 MG tablet Take 1 tablet (1,000 mg total) by mouth 2 (two) times daily with a meal. FOR DIABETES 180 tablet 1  . mycophenolate (CELLCEPT) 500 MG tablet TAKE 1 TABLET TWICE DAILY 1 HOUR BEFORE OR 2 HOURS  AFTER MEALS 60 tablet 5  . pravastatin (PRAVACHOL) 40 MG tablet Take 1 tablet (40 mg total) by mouth at bedtime. FOR CHOLESTEROL 90 tablet 1  . sulfamethoxazole-trimethoprim (BACTRIM DS) 800-160 MG per tablet Take 1 tablet by mouth every Monday, Wednesday, and Friday. 12 tablet 1  . levofloxacin  (LEVAQUIN) 500 MG tablet Take 1 tablet (500 mg total) by mouth daily. (Patient not taking: Reported on 03/12/2014) 7 tablet 0   No current facility-administered medications on file prior to visit.    HPI Comments: [name] is a 63 y.o. male with a hx of DM, HTN, pulmonary fibrosis, coronary artery calcification and post herpetic neuralgia who presents to [facility] complaining of persistent productive cough. Pt denies changes from baseline. Pt states that he does not want to go to the hospital. He states going to the hospital is the absolute last resort.  Pt was seen six weeks ago at [facility] and was diagnosed with bronchities, exasebated by his pulmonary fibrosis. Pulse ox 87% on arrival. Increased to 95% while on 2L of oxygen. White count was 17.1 at that time. Pt was put on LEVAQUIN 557m daily. Pt saw labour Pulmonology on follow up two days later and was started on prophylactic bactrum.    Review of Systems  Constitutional: Negative for fever and chills.  HENT: Positive for congestion.   Respiratory: Positive for shortness of breath.   Cardiovascular: Negative for chest pain.       Objective:   Physical Exam  Constitutional: He is oriented to person, place, and time. He appears well-developed and well-nourished.  Pt with open  mouth breathing unable to speak more than one or two words without stopping for a breath.   HENT:  Head: Normocephalic and atraumatic.  Eyes: Pupils are equal, round, and reactive to light.  Neck: Neck supple.  Cardiovascular: Normal rate and regular rhythm.   Tachycardic with RRR.  Pulmonary/Chest: Effort normal and breath sounds normal. No respiratory distress.  Severely decreased breath sounds throughout, with expiratory and inspiratory wheezing and rales.  Neurological: He is alert and oriented to person, place, and time.  Skin: Skin is warm and dry.  Psychiatric: He has a normal mood and affect. His behavior is normal.  Nursing note and vitals  reviewed.  BP 112/72 mmHg  Pulse 106  Resp 18  Ht 5' 8.25" (1.734 m)  Wt 201 lb 6.4 oz (91.354 kg)  BMI 30.38 kg/m2  SpO2 88%    [facility] reading (PRIMARY) by  Dr. Brigitte Pulse. CXR: chronic fibrosis - improved somewhat from prior.  Assessment & Plan:  Pt was told he should probably go to the hospital and refused. Then stated that he would only go to the hospital as a last resort.  He states exacerbations of his chronic copd/fibrosis occur commonly and after a duoneb treatment in office his sxs resolve to baseline - running 94-96% on 2L  sitting and able to speak in complete sentences after neb treatment. Start sched duoneb treatments qid at home. Recheck in1-2d - call 911 and to ER if worsening - pt understands and agrees. Has f/u OV w/ pulmonology in 2 wks Shortness of breath - Plan: DG Chest 2 View, POCT CBC, Lactic Acid, Plasma, POCT SEDIMENTATION RATE  PULMONARY FIBROSIS ILD POST INFLAMMATORY CHRONIC - Plan: methylPREDNISolone sodium succinate (SOLU-MEDROL) 125 mg/2 mL injection 125 mg, cefTRIAXone (ROCEPHIN) injection 1 g  Type II diabetes mellitus, uncontrolled - Plan: COMPLETE METABOLIC PANEL WITH GFR, POCT glucose (manual entry), POCT glycosylated hemoglobin (Hb A1C)  Essential hypertension, benign - Plan: COMPLETE METABOLIC PANEL WITH GFR  Hyperlipidemia  Coronary artery calcification - Plan: Lactic Acid, Plasma  UIP (usual interstitial pneumonitis) - Plan: Lactic Acid, Plasma, POCT SEDIMENTATION RATE, methylPREDNISolone sodium succinate (SOLU-MEDROL) 125 mg/2 mL injection 125 mg - flair so cover w/ steroids as well as double antibiotic coverage per [place] and UTD.  Meds ordered this encounter  Medications  . methylPREDNISolone sodium succinate (SOLU-MEDROL) 125 mg/2 mL injection 125 mg    Sig:   . predniSONE (DELTASONE) 20 MG tablet    Sig: Take 2 tablets (40 mg total) by mouth daily with breakfast.    Dispense:  10 tablet    Refill:  0  . cefTRIAXone (ROCEPHIN) injection 1 g     Sig:     Order Specific Question:  Antibiotic Indication:    Answer:  CAP  . azithromycin (ZITHROMAX) 500 MG tablet    Sig: Take 1 tablet (500 mg total) by mouth daily.    Dispense:  3 tablet    Refill:  0  . amoxicillin-clavulanate (AUGMENTIN XR) 1000-62.5 MG per tablet    Sig: Take 2 tablets by mouth 2 (two) times daily.    Dispense:  40 tablet    Refill:  0  . ipratropium-albuterol (DUONEB) 0.5-2.5 (3) MG/3ML SOLN    Sig: Take 3 mLs by nebulization every 4 (four) hours as needed.    Dispense:  360 mL    Refill:  21    I personally performed the services described in this documentation, which was scribed in my presence. The recorded information has been  reviewed and considered, and addended by me as needed.  [name], MD MPH

## 2014-03-14 ENCOUNTER — Ambulatory Visit (INDEPENDENT_AMBULATORY_CARE_PROVIDER_SITE_OTHER): Payer: Medicare Other | Admitting: Emergency Medicine

## 2014-03-14 VITALS — BP 128/70 | HR 124 | Temp 97.7°F | Resp 20

## 2014-03-14 DIAGNOSIS — J84112 Idiopathic pulmonary fibrosis: Secondary | ICD-10-CM | POA: Diagnosis not present

## 2014-03-14 DIAGNOSIS — R000 Tachycardia, unspecified: Secondary | ICD-10-CM | POA: Diagnosis not present

## 2014-03-14 DIAGNOSIS — I10 Essential (primary) hypertension: Secondary | ICD-10-CM | POA: Diagnosis not present

## 2014-03-14 DIAGNOSIS — IMO0002 Reserved for concepts with insufficient information to code with codable children: Secondary | ICD-10-CM

## 2014-03-14 DIAGNOSIS — E1165 Type 2 diabetes mellitus with hyperglycemia: Secondary | ICD-10-CM

## 2014-03-14 NOTE — Progress Notes (Signed)
[facility] [address], [place] [address] [phone]  Date:  03/14/2014   Name:  [name]   DOB:  [birth year]   MRN:  [id number]  PCP:  [name], MD    Chief Complaint: Follow-up   History of Present Illness:  [name] is a 63 y.o. very pleasant male patient who presents with the following:  History of pulmonary fibrosis and was here for shortness of breath seen by Dr Brigitte Pulse 12/18 and refused hospitalization Now here for recheck Says he is "no longer short of breath" and "feels fine".  Doesn't like the portable O2 system Claims compliance with his neb treatments No fever or chills  No sputum production Poor exercise tolerance but is better than was on the 18th Has appt with pulmonary on 1/4 Denies other complaint or health concern today.   Patient Active Problem List   Diagnosis Date Noted  . Coronary artery calcification 02/02/2014  . Dyspnea 12/31/2013  . UIP (usual interstitial pneumonitis) 12/03/2013  . Pulmonary nodules 12/03/2013  . Shingles 10/09/2013  . Post herpetic neuralgia 10/09/2013  . Rheumatoid arthritis 06/26/2013  . Hyperlipidemia 01/28/2013  . Type II diabetes mellitus, uncontrolled 01/27/2013  . Essential hypertension, benign 01/27/2013  . COUGH 10/10/2009  . PULMONARY FIBROSIS ILD POST INFLAMMATORY CHRONIC 12/30/2007    Past Medical History  Diagnosis Date  . Rheumatoid arthritis(714.0) 2005  . Pulmonary fibrosis   . Hypertension   . GERD (gastroesophageal reflux disease)   . Diabetes mellitus without complication   . Dyspnea     Past Surgical History  Procedure Laterality Date  . Excision mass r lateral arm 2010  2010  . Mass excision  03/08/2011    Procedure: EXCISION MASS;  Surgeon: Cammie Sickle., MD;  Location: [facility];  Service: Orthopedics;  Laterality: Right;  excision rheumatoid nodule right ulna    History  Substance Use Topics  . Smoking status: Former Smoker --  0.50 packs/day for 30 years    Quit date: 01/25/2008  . Smokeless tobacco: Never Used  . Alcohol Use: No    History reviewed. No pertinent family history.  No Known Allergies  Medication list has been reviewed and updated.  Current Outpatient Prescriptions on File Prior to Visit  Medication Sig Dispense Refill  . Adalimumab (HUMIRA) 20 MG/0.4ML KIT As directed every 2 wks    . albuterol (VENTOLIN HFA) 108 (90 BASE) MCG/ACT inhaler Inhale 2 puffs into the lungs every 4 (four) hours as needed for wheezing or shortness of breath. 8.5 each 11  . amoxicillin-clavulanate (AUGMENTIN XR) 1000-62.5 MG per tablet Take 2 tablets by mouth 2 (two) times daily. 40 tablet 0  . aspirin 81 MG tablet Take 81 mg by mouth daily.    Marland Kitchen azithromycin (ZITHROMAX) 500 MG tablet Take 1 tablet (500 mg total) by mouth daily. 3 tablet 0  . celecoxib (CELEBREX) 200 MG capsule Take 200 mg by mouth daily.     Marland Kitchen gabapentin (NEURONTIN) 400 MG capsule Take 800 mg by mouth 2 (two) times daily.    Marland Kitchen glucose blood (ONE TOUCH ULTRA TEST) test strip Test blood sugar daily. Dx code: E11.65 100 each 2  . HUMIRA PEN 40 MG/0.8ML PNKT Inject 40 mg into the muscle every 21 ( twenty-one) days.  1  . ipratropium-albuterol (DUONEB) 0.5-2.5 (3) MG/3ML SOLN Take 3 mLs by nebulization every 4 (four) hours as needed. 360 mL 21  . losartan (COZAAR) 50 MG  tablet TAKE 1 TABLET BY MOUTH ONCE DAILY FOR BLOOD PRESSURE 90 tablet 1  . metFORMIN (GLUCOPHAGE) 1000 MG tablet Take 1 tablet (1,000 mg total) by mouth 2 (two) times daily with a meal. FOR DIABETES 180 tablet 1  . mycophenolate (CELLCEPT) 500 MG tablet TAKE 1 TABLET TWICE DAILY 1 HOUR BEFORE OR 2 HOURS  AFTER MEALS 60 tablet 5  . pravastatin (PRAVACHOL) 40 MG tablet Take 1 tablet (40 mg total) by mouth at bedtime. FOR CHOLESTEROL 90 tablet 1  . predniSONE (DELTASONE) 20 MG tablet Take 2 tablets (40 mg total) by mouth daily with breakfast. 10 tablet 0  . sulfamethoxazole-trimethoprim  (BACTRIM DS) 800-160 MG per tablet Take 1 tablet by mouth every Monday, Wednesday, and Friday. 12 tablet 1   No current facility-administered medications on file prior to visit.    Review of Systems:  As per HPI, otherwise negative.    Physical Examination: Filed Vitals:   03/14/14 1320  BP: 128/70  Pulse: 124  Temp: 97.7 F (36.5 C)  Resp: 20   There were no vitals filed for this visit. There is no weight on file to calculate BMI. Ideal Body Weight:    GEN: WDWN, NAD, Non-toxic, A & O x 3 on portable oxygen HEENT: Atraumatic, Normocephalic. Neck supple. No masses, No LAD. Ears and Nose: No external deformity. CV: RRR, No M/G/R. No JVD. No thrill. No extra heart sounds. PULM: CTA B,  Diffuse wheezes and  Crackles, no rhonchi. Poor air movement  No retractions. No resp. distress. No accessory muscle use.  Able to speak in full sentences ABD: S, NT, ND, +BS. No rebound. No HSM. EXTR: No c/c/e NEURO Normal gait.  PSYCH: Normally interactive. Conversant. Not depressed or anxious appearing.  Calm demeanor.    Assessment and Plan: UIP Improved since 12/18 Follow up with pulmonary 1/4 Continue medications and use oxygen   Signed,  Ellison Carwin, MD

## 2014-03-14 NOTE — Patient Instructions (Signed)
Idiopathic Pulmonary Fibrosis Idiopathic pulmonary fibrosis is an inflammation (soreness and irritation) in the lungs which eventually causes scarring. This usually shows in middle age over a several year time period. The cause is unknown (idiopathic). Usually death occurs after several years but there are no time tables which will predict perfectly what the course of the illness will be. It affects males and females equally. In this condition there is a formation of fibrous (tough leathery) tissue in the small ducts which carry air to and from your lungs. Because of this, the lungs do not work as well as they should for taking in oxygen from the air you breathe and getting rid of wastes (carbon dioxide). Because the lungs are damaged, there may be more problems with infections or the heart.  Other problems may include pulmonary hypertension (high blood pressure in the lungs), and formation of blood clots. Some symptoms of this illness are:  Coughing and breathing difficulties; cough is usually dry and hacking.  Bluish (cyanotic) skin and lips due to lack of circulating oxygen.  Loss of appetite.  Loss of strength which comes from just the increased work of breathing.  Rapid, shallow breathing occur with moderate exercise and later even while resting.  Increasing shortness of breath (dyspnea) which progresses as the disease gets worse.  Weight loss and fatigue due partly to the increased work of breathing.  Clubbing of the fingers (the ends of the fingers become rounded and enlarged). DIAGNOSIS  A diagnosis of idiopathic pulmonary fibrosis may be suspected based on exam and a patient's history.  Specialized X-rays, pulmonary function tests, pulse oximetry, and laboratory tests including blood gasses help confirm the problem.  Sometimes a biopsy is done and a small piece of lung tissue is removed. This may be done through a bronchoscope or during an operation in which the chest is opened. This  is looked at under a microscope by a specialist who can tell what the lung problem is. CAUSES If the cause of pulmonary fibrosis is known, it is no longer known as idiopathic. Several causes of pulmonary fibrosis include:  Occupational and environmental exposures to asbestos, silica, and or metal dusts.  Illegal or street drug use.  Agricultural workers may inhale substances, such as moldy hay, which can cause an allergic reaction in the lung. This reaction is called Farmer's Lung and can cause pulmonary fibrosis. Some other fumes found on farms are directly toxic to the lungs.  Exposure of the lungs to radiation.  Collagen diseases.  Sarcoidosis is a disease which forms granulomas (areas of inflammatory cells), which can attack any area of the body but most frequently affects the lungs.  Drugs. Certain medicines may have the undesirable side effect of causing pulmonary fibrosis. Check with your doctor about the medicines you are taking and ask about any possible side effects.  Some cases of pulmonary fibrosis seem to be genetic. TREATMENT   There are no drugs currently approved for the treatment of pulmonary fibrosis. Steroids (a potent medication which cuts down on inflammation) are sometimes given to prevent lung changes before they become permanent. High doses may be recommended at first, followed by lower maintenance dosages. Other medications may be tried if steroids do not work.  Lung disease may be monitored with X-rays and laboratory work.  Oxygen may be helpful if oxygen in the blood is diminished. This improves the quality of life. Your caregiver will give you a prescription for this if it is helpful.  Antibiotics are used   for treatment of infections.  Exercise may be beneficial.  Lung transplants are being investigated and a single lung transplant may be considered for some patients.  Influenza vaccine and pneumococcal pneumonia vaccine are both recommended for people  with IPF or any lung disease. These two shots may help keep you healthy. Document Released: 06/02/2003 Document Revised: 06/04/2011 Document Reviewed: 03/12/2005 [employer] Patient Information 2015 [employer]. This information is not intended to replace advice given to you by your health care provider. Make sure you discuss any questions you have with your health care provider.  

## 2014-03-22 ENCOUNTER — Encounter: Payer: Self-pay | Admitting: Family Medicine

## 2014-03-24 ENCOUNTER — Telehealth: Payer: Self-pay | Admitting: Emergency Medicine

## 2014-03-24 NOTE — Telephone Encounter (Signed)
Received a fax on pt to fax over records to Dr. Arlean Hopping office. Called and left message to see what records are needed.   [facility]: Dr. Arlean Hopping office//Kayla (nurse) Phone: [phone] Fax: [phone]

## 2014-03-24 NOTE — Telephone Encounter (Signed)
Fax sent down to [facility] that states the records needed. Will sign off.

## 2014-03-24 NOTE — Telephone Encounter (Signed)
Amy with Dr Army Melia office returned call - They need all recent labs within last year & all office notes within last year.

## 2014-03-29 ENCOUNTER — Encounter: Payer: Self-pay | Admitting: Internal Medicine

## 2014-03-29 ENCOUNTER — Other Ambulatory Visit (INDEPENDENT_AMBULATORY_CARE_PROVIDER_SITE_OTHER): Payer: Medicare Other

## 2014-03-29 ENCOUNTER — Ambulatory Visit (INDEPENDENT_AMBULATORY_CARE_PROVIDER_SITE_OTHER): Payer: BC Managed Care – HMO | Admitting: Internal Medicine

## 2014-03-29 VITALS — BP 118/66 | HR 112 | Ht 69.0 in | Wt 201.0 lb

## 2014-03-29 DIAGNOSIS — M069 Rheumatoid arthritis, unspecified: Secondary | ICD-10-CM

## 2014-03-29 DIAGNOSIS — J84112 Idiopathic pulmonary fibrosis: Secondary | ICD-10-CM

## 2014-03-29 DIAGNOSIS — Z5181 Encounter for therapeutic drug level monitoring: Secondary | ICD-10-CM

## 2014-03-29 LAB — CBC
HCT: 34.9 % — ABNORMAL LOW (ref 39.0–52.0)
HEMOGLOBIN: 10.9 g/dL — AB (ref 13.0–17.0)
MCHC: 31.3 g/dL (ref 30.0–36.0)
MCV: 93.2 fl (ref 78.0–100.0)
PLATELETS: 256 10*3/uL (ref 150.0–400.0)
RBC: 3.74 Mil/uL — ABNORMAL LOW (ref 4.22–5.81)
RDW: 13.9 % (ref 11.5–15.5)
WBC: 12.1 10*3/uL — ABNORMAL HIGH (ref 4.0–10.5)

## 2014-03-29 MED ORDER — SULFAMETHOXAZOLE-TRIMETHOPRIM 800-160 MG PO TABS: 1.0000 | ORAL_TABLET | ORAL | Status: DC

## 2014-03-29 NOTE — Patient Instructions (Addendum)
ICD-9-CM ICD-10-CM   1. Encounter for therapeutic drug monitoring V58.83 Z51.81   2. UIP (usual interstitial pneumonitis) 515 J84.112   3. Rheumatoid arthritis 714.0 M06.9     Glad you are feeling beter Do blood work CBC, BMEt, LFT - will call with results Continue cellcept at 500mg  twice daily Continue Bactrim 1 tab Monday, wed Friday Meet with [facility] for OXYGO system   Followup  spirometry in 2 months office (not [facility]) REturn to see me or NP Tammy in  2 months  AT next visit increase cellcept to 1000mg  bid

## 2014-03-29 NOTE — Progress Notes (Signed)
Subjective:    Patient ID: [name], male    DOB: 1950-10-30, 64 y.o.   MRN: [id number]  HPI    PC Galen Daft, MD Rheum: Dr Bo Merino  HPI  IOV 12/03/2013    Chief Complaint  Patient presents with  . Pulmonary Consult    Referred by Dr. Estanislado Pandy for ILD.    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 He is an extremely poor historian. Information is gathered from him, outside records, [facility] interstitial lung disease clinic 2013 notes  As best as I can tell patient has rheumatoid arthritis since approximately 2010. He is being treated with Celebrex and Humira for possibly the same amount of time. His symptoms are largely limited to the joints. Apparently these medications to control his joint pain. 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 He was followed at [facility] interstitial lung disease clinic by Dr. Wynn Maudlin. Last visit was recorded in the spring of 2013 ital capacity was 58% and diffusion capacity was 40% consistent with moderate interstitial lung disease. Patient himself reports moderate of cough and moderate amount of dyspnea. He says that both these symptoms only impacting his quality of life to a mild extent and is able to live well despite these symptoms. In the office he desaturated immediately after standing up but despite this he feels that his symptoms are impacting his quality of life a little mild extent.  He would not tell me why he switched from [facility] but it appears that probably because he lives alone  Review of this imaging shows that he's had pulmonary fibrosis at least as of 2005 which means it predates the diagnoses of rheumatoid arthritis which is typically the case in many men  CT scan of the chest 06/28/2010: In my opinion has classic UIP features with significant  honeycombing. This is very similar to a CT scan of the chest in September 2009 and February 2005 but likely worse compared to 2005  In terms of his rheumatoid arthritis according to outside notes his positive rheumatoid factor and positive CCP. He does not have any synovitis on exam as of July 2015.   Other issues    - . reports that he quit smoking about 5 years ago. He has never used smokeless tobacco.  - [facility] records suggest that he had right middle lobe nodule that had moderate activity on PET scan in 2013. These have not been followed up since then. Patient completely denies knowledge of these  REC #Pulmonary FIbrosis - UIP due to Rheumatoid ARthritis  - this is due to Rheumatoid Arthritis  - start portable oxygen system 2L [place] with walking; will help you - do HRCT wo contrast - do office spirometry at followup   #Lung nodules seen on CT in 2013 at [place]  - do HRCT wo contrast  #Followup 1 month - after completing above Office Spirometry (not June Leap) at time of followup   OV 12/31/2013  Chief Complaint  Patient presents with  . Follow-up    Pt states his breathing has improved since last visit. Pt states his SOB has improved. Pt c/o intermittent dry cough. Pt denies CP/tightness.    HEre to review results. Poor hx with RA and UIP. COncern is that UIP is progresslive.  HE feels fine. AGain, talks very little. Spirometry compared to 2013 shows  progressive disease; FVC now is 51% but was 58% in 2013. CT chest sept 2015 also shows progressive disease compared to 2012. HE says he does not feel the progression. HE is open to listening to my recommendations. He is willing to try cellcept to slow progression of disease    SPirro 12/31/2013  - fvc  2.3L/51%, fev1 1.99L/57%, ratio 87   CT chest 12/10/13: COMPARISON: Chest CT 06/28/2010 IMPRESSION:  1. The appearance of the chest is most compatible with progressively  worsening usual interstitial pneumonia (UIP), as discussed  above.   2. Multiple borderline enlarged and mildly enlarged mediastinal and  bilateral hilar lymph nodes are presumably benign and reactive in  the setting of chronic interstitial lung disease.   3. Atherosclerosis, including left circumflex coronary artery  disease. Please note that although the presence of coronary artery  calcium documents the presence of coronary artery disease, the  severity of this disease and any potential stenosis cannot be  assessed on this non-gated CT examination. Assessment for potential  risk factor modification, dietary therapy or pharmacologic therapy  may be warranted, if clinically indicated.  Electronically Signed  By: Vinnie Langton M.D.  On: 12/10/2013 15:26    REC Start cellcept  01/29/2014 Follow up (Rheumatoid arthritis related UIP type of pulmonary fibrosis.) Returns for follow up .  Started on Cellcept last ov, has been on for last 1-2 weeks, took a while to  [employer].  Was seen 2 days ago by PCP dx w/ bronchitis , tx w/ Levaquin  CXR w/ no acute changes.  Says cough and congestion are getting better Denies chest pain, orthopnea, edema or fever.      OV 03/29/2014  Chief Complaint  Patient presents with  . Follow-up    Pt stated he has more energy since last OV. Pt c/o DOE, mild dry cough. Pt denies CP/tightness.     Follow-up rheumatoid arthritis interstitial lung disease UIP pattern  I started CellCept early October 2015. After this he followed up with my nurse practitioner in November 2015. Between those 2 visits he did have some bronchitis and was treated with Levaquin. He says that he is feeling better in terms of his dyspnea and cough. He still continues to smoke. His last lab work was November 2015 and normal. In December 2015 I did speak with his rheumatologist who has determined that his rheumatoid arthritis is largely confined to the lungs and over time she will slowly stop his Humira. Because he is already  on too strong immunomodulators of Humira and CellCept we have held off on starting prednisone at this stage. He is due for lab work right now. He has not had pulmonary function test. He uses his oxygen nasal cannula. He is interested in OXYGO system, his current CellCept dose is 571m twice dail; not been increased yet   Review of Systems  Constitutional: Negative for fever and unexpected weight change.  HENT: Negative for congestion, dental problem, ear pain, nosebleeds, postnasal drip, rhinorrhea, sinus pressure, sneezing, sore throat and trouble swallowing.   Eyes: Negative for redness and itching.  Respiratory: Positive for cough and shortness of breath. Negative for chest tightness and wheezing.   Cardiovascular: Negative for palpitations and leg swelling.  Gastrointestinal: Negative for nausea and vomiting.  Genitourinary: Negative for dysuria.  Musculoskeletal: Negative for joint swelling.  Skin: Negative for rash.  Neurological: Negative for headaches.  Hematological: Does not bruise/bleed easily.  Psychiatric/Behavioral: Negative for dysphoric mood. The patient is not nervous/anxious.  Current outpatient prescriptions: Adalimumab (HUMIRA) 20 MG/0.4ML KIT, As directed every 3 wks, Disp: , Rfl: ;  albuterol (VENTOLIN HFA) 108 (90 BASE) MCG/ACT inhaler, Inhale 2 puffs into the lungs every 4 (four) hours as needed for wheezing or shortness of breath., Disp: 8.5 each, Rfl: 11;  aspirin 81 MG tablet, Take 81 mg by mouth daily., Disp: , Rfl:  gabapentin (NEURONTIN) 400 MG capsule, Take 800 mg by mouth 2 (two) times daily., Disp: , Rfl: ;  glucose blood (ONE TOUCH ULTRA TEST) test strip, Test blood sugar daily. Dx code: E11.65, Disp: 100 each, Rfl: 2;  ipratropium-albuterol (DUONEB) 0.5-2.5 (3) MG/3ML SOLN, Take 3 mLs by nebulization every 4 (four) hours as needed., Disp: 360 mL, Rfl: 21 losartan (COZAAR) 50 MG tablet, TAKE 1 TABLET BY MOUTH ONCE DAILY FOR BLOOD PRESSURE, Disp: 90 tablet, Rfl:  1;  metFORMIN (GLUCOPHAGE) 1000 MG tablet, Take 1 tablet (1,000 mg total) by mouth 2 (two) times daily with a meal. FOR DIABETES, Disp: 180 tablet, Rfl: 1;  mycophenolate (CELLCEPT) 500 MG tablet, TAKE 1 TABLET TWICE DAILY 1 HOUR BEFORE OR 2 HOURS  AFTER MEALS, Disp: 60 tablet, Rfl: 5 pravastatin (PRAVACHOL) 40 MG tablet, Take 1 tablet (40 mg total) by mouth at bedtime. FOR CHOLESTEROL, Disp: 90 tablet, Rfl: 1;  sulfamethoxazole-trimethoprim (BACTRIM DS) 800-160 MG per tablet, Take 1 tablet by mouth every Monday, Wednesday, and Friday. (Patient not taking: Reported on 03/29/2014), Disp: 12 tablet, Rfl: 1     Objective:   Physical Exam  Filed Vitals:   03/29/14 1629  BP: 118/66  Pulse: 112  Height: '5\' 9"'  (1.753 m)  Weight: 201 lb (91.173 kg)  SpO2: 93%     Physical Exam GEN: A/Ox3; pleasant , NAD, dishelved SMELLS OF TOBACCO  Psych: poor historian  HEENT:  [place]/AT,  EACs-clear, TMs-wnl, NOSE-clear, THROAT-clear, no lesions, no postnasal drip or exudate noted.   NECK:  Supple w/ fair ROM; no JVD; normal carotid impulses w/o bruits; no thyromegaly or nodules palpated; no lymphadenopathy.  RESP  Bibasilar crackles no accessory muscle use, no dullness to percussion  CARD:  RRR, no m/r/g  , no peripheral edema, pulses intact, no cyanosis or clubbing.  GI:   Soft & nt; nml bowel sounds; no organomegaly or masses detected.  Musco: Warm bil, no deformities or joint swelling noted.   Neuro: alert, no focal deficits noted.    Skin: Warm, no lesions or rashes       Assessment & Plan:     ICD-9-CM ICD-10-CM   1. Encounter for therapeutic drug monitoring V58.83 Z51.81   2. UIP (usual interstitial pneumonitis) 515 J84.112   3. Rheumatoid arthritis 714.0 M06.9      Glad you are feeling beter Do blood work CBC, BMEt, LFT - will call with results Continue cellcept at 556m twice daily Continue Bactrim 1 tab Monday, wed [facility]   Followup  spirometry in 2  months office (not TJune Leap REturn to see me or NP Tammy in  2 months ; plan to increase cellcept to 10060mbid   Dr. MuBrand MalesM.D., F.[facility].P Pulmonary and Critical Care Medicine Staff Physician [facility] [place] and Critical Care Pager: [phone] no answer or between  15:00h - 7:00h: call [phone]  03/29/2014 4:49 PM

## 2014-03-30 LAB — BASIC METABOLIC PANEL
BUN: 18 mg/dL (ref 6–23)
CO2: 24 mEq/L (ref 19–32)
Calcium: 9.1 mg/dL (ref 8.4–10.5)
Chloride: 106 mEq/L (ref 96–112)
Creatinine, Ser: 1 mg/dL (ref 0.4–1.5)
GFR: 77.88 mL/min (ref >60.00)
Glucose, Bld: 150 mg/dL — ABNORMAL HIGH (ref 70–99)
Potassium: 4.5 mEq/L (ref 3.5–5.1)
Sodium: 139 mEq/L (ref 135–145)

## 2014-03-30 LAB — HEPATIC FUNCTION PANEL
ALT: 24 U/L (ref 0–53)
AST: 22 U/L (ref 0–37)
Albumin: 3.9 g/dL (ref 3.5–5.2)
Alkaline Phosphatase: 91 U/L (ref 39–117)
Bilirubin, Direct: 0.1 mg/dL (ref 0.0–0.3)
Total Bilirubin: 0.4 mg/dL (ref 0.2–1.2)
Total Protein: 8 g/dL (ref 6.0–8.3)

## 2014-04-01 ENCOUNTER — Other Ambulatory Visit: Payer: Self-pay | Admitting: Emergency Medicine

## 2014-04-06 ENCOUNTER — Other Ambulatory Visit: Payer: Self-pay | Admitting: Internal Medicine

## 2014-04-06 ENCOUNTER — Encounter: Payer: Self-pay | Admitting: *Deleted

## 2014-04-06 DIAGNOSIS — D649 Anemia, unspecified: Secondary | ICD-10-CM

## 2014-04-27 ENCOUNTER — Other Ambulatory Visit (INDEPENDENT_AMBULATORY_CARE_PROVIDER_SITE_OTHER): Payer: Medicare Other

## 2014-04-27 DIAGNOSIS — D649 Anemia, unspecified: Secondary | ICD-10-CM

## 2014-04-27 LAB — CBC WITH DIFFERENTIAL/PLATELET
BASOS ABS: 0.2 10*3/uL — AB (ref 0.0–0.1)
Basophils Relative: 1.2 % (ref 0.0–3.0)
Eosinophils Absolute: 0.6 10*3/uL (ref 0.0–0.7)
Eosinophils Relative: 4.4 % (ref 0.0–5.0)
HCT: 39.6 % (ref 39.0–52.0)
Hemoglobin: 13.1 g/dL (ref 13.0–17.0)
LYMPHS ABS: 4.2 10*3/uL — AB (ref 0.7–4.0)
Lymphocytes Relative: 30.5 % (ref 12.0–46.0)
MCHC: 33.1 g/dL (ref 30.0–36.0)
MCV: 89.8 fl (ref 78.0–100.0)
MONOS PCT: 7.6 % (ref 3.0–12.0)
Monocytes Absolute: 1 10*3/uL (ref 0.1–1.0)
NEUTROS PCT: 56.3 % (ref 43.0–77.0)
Neutro Abs: 7.7 10*3/uL (ref 1.4–7.7)
Platelets: 385 10*3/uL (ref 150.0–400.0)
RBC: 4.41 Mil/uL (ref 4.22–5.81)
RDW: 13.3 % (ref 11.5–15.5)
WBC: 13.7 10*3/uL — AB (ref 4.0–10.5)

## 2014-04-30 ENCOUNTER — Ambulatory Visit (INDEPENDENT_AMBULATORY_CARE_PROVIDER_SITE_OTHER): Payer: Medicare Other | Admitting: Family Medicine

## 2014-04-30 ENCOUNTER — Encounter: Payer: Self-pay | Admitting: Family Medicine

## 2014-04-30 ENCOUNTER — Telehealth: Payer: Self-pay | Admitting: *Deleted

## 2014-04-30 VITALS — BP 116/74 | HR 99 | Temp 97.3°F | Resp 20 | Ht 69.5 in | Wt 202.0 lb

## 2014-04-30 DIAGNOSIS — E785 Hyperlipidemia, unspecified: Secondary | ICD-10-CM

## 2014-04-30 DIAGNOSIS — Z1211 Encounter for screening for malignant neoplasm of colon: Secondary | ICD-10-CM

## 2014-04-30 DIAGNOSIS — B0229 Other postherpetic nervous system involvement: Secondary | ICD-10-CM

## 2014-04-30 DIAGNOSIS — R195 Other fecal abnormalities: Secondary | ICD-10-CM

## 2014-04-30 DIAGNOSIS — E1165 Type 2 diabetes mellitus with hyperglycemia: Secondary | ICD-10-CM

## 2014-04-30 DIAGNOSIS — IMO0002 Reserved for concepts with insufficient information to code with codable children: Secondary | ICD-10-CM

## 2014-04-30 DIAGNOSIS — I2510 Atherosclerotic heart disease of native coronary artery without angina pectoris: Secondary | ICD-10-CM

## 2014-04-30 DIAGNOSIS — Z0000 Encounter for general adult medical examination without abnormal findings: Secondary | ICD-10-CM

## 2014-04-30 DIAGNOSIS — Z23 Encounter for immunization: Secondary | ICD-10-CM

## 2014-04-30 DIAGNOSIS — I2584 Coronary atherosclerosis due to calcified coronary lesion: Secondary | ICD-10-CM

## 2014-04-30 DIAGNOSIS — M069 Rheumatoid arthritis, unspecified: Secondary | ICD-10-CM

## 2014-04-30 DIAGNOSIS — J8410 Pulmonary fibrosis, unspecified: Secondary | ICD-10-CM

## 2014-04-30 DIAGNOSIS — I10 Essential (primary) hypertension: Secondary | ICD-10-CM

## 2014-04-30 LAB — IFOBT (OCCULT BLOOD): IMMUNOLOGICAL FECAL OCCULT BLOOD TEST: POSITIVE

## 2014-04-30 NOTE — Telephone Encounter (Signed)
Pt took home urine cup for microalbumin.  He will bring it in on 2/6 to the walkin.  Lab requisition will be at lab 102.

## 2014-04-30 NOTE — Progress Notes (Signed)
Quick Note:  lmtcb for pt. ______ 

## 2014-04-30 NOTE — Patient Instructions (Signed)

## 2014-05-01 LAB — PSA: PSA: 0.85 ng/mL (ref <=4.00)

## 2014-05-01 LAB — MICROALBUMIN, URINE: MICROALB UR: 8.8 mg/dL — AB (ref <2.0)

## 2014-05-03 ENCOUNTER — Telehealth: Payer: Self-pay | Admitting: Internal Medicine

## 2014-05-03 NOTE — Telephone Encounter (Signed)
Notes Recorded by Maurice March, RN on 04/30/2014 at 4:26 PM lmtcb for pt. Notes Recorded by Glean Hess, CMA on 04/28/2014 at 5:28 PM lmtcb Notes Recorded by Brand Males, MD on 04/27/2014 at 9:37 PM Normal cbc 04/27/2014  Pt aware of results. Nothing more needed at this time.

## 2014-05-04 ENCOUNTER — Encounter: Payer: Self-pay | Admitting: Family Medicine

## 2014-05-04 MED ORDER — LOSARTAN POTASSIUM 50 MG PO TABS: ORAL_TABLET | ORAL | Status: DC

## 2014-05-04 MED ORDER — METFORMIN HCL 1000 MG PO TABS: 1000.0000 mg | ORAL_TABLET | Freq: Two times a day (BID) | ORAL | Status: DC

## 2014-05-04 MED ORDER — PRAVASTATIN SODIUM 40 MG PO TABS: 40.0000 mg | ORAL_TABLET | Freq: Every day | ORAL | Status: DC

## 2014-05-04 NOTE — Progress Notes (Signed)
Subjective:    Patient ID: [name], male    DOB: 16-Jun-1950, 64 y.o.   MRN: [id number] This chart was scribed for Riley Cheadle, MD by Marti Sleigh, Medical Scribe. This patient was seen in Room 25 and the patient's care was started a 1:37 PM.  Chief Complaint  Patient presents with  . Annual Exam    HPI Past Medical History  Diagnosis Date  . Rheumatoid arthritis(714.0) 2005  . Pulmonary fibrosis   . Hypertension   . GERD (gastroesophageal reflux disease)   . Diabetes mellitus without complication   . Dyspnea    No Known Allergies Current Outpatient Prescriptions on File Prior to Visit  Medication Sig Dispense Refill  . albuterol (VENTOLIN HFA) 108 (90 BASE) MCG/ACT inhaler Inhale 2 puffs into the lungs every 4 (four) hours as needed for wheezing or shortness of breath. 8.5 each 11  . aspirin 81 MG tablet Take 81 mg by mouth daily.    Marland Kitchen gabapentin (NEURONTIN) 400 MG capsule TAKE 2 CAPSULES (800 MG TOTAL) BY MOUTH 3 (THREE) TIMES DAILY. 180 capsule 1  . glucose blood (ONE TOUCH ULTRA TEST) test strip Test blood sugar daily. Dx code: E11.65 100 each 2  . ipratropium-albuterol (DUONEB) 0.5-2.5 (3) MG/3ML SOLN Take 3 mLs by nebulization every 4 (four) hours as needed. 360 mL 21  . losartan (COZAAR) 50 MG tablet TAKE 1 TABLET BY MOUTH ONCE DAILY FOR BLOOD PRESSURE 90 tablet 1  . metFORMIN (GLUCOPHAGE) 1000 MG tablet Take 1 tablet (1,000 mg total) by mouth 2 (two) times daily with a meal. FOR DIABETES 180 tablet 1  . mycophenolate (CELLCEPT) 500 MG tablet TAKE 1 TABLET TWICE DAILY 1 HOUR BEFORE OR 2 HOURS  AFTER MEALS 60 tablet 5  . pravastatin (PRAVACHOL) 40 MG tablet Take 1 tablet (40 mg total) by mouth at bedtime. FOR CHOLESTEROL 90 tablet 1  . sulfamethoxazole-trimethoprim (BACTRIM DS,SEPTRA DS) 800-160 MG per tablet Take 1 tablet by mouth 3 (three) times a week. 12 tablet 5   No current facility-administered medications on file prior to visit.    HPI Comments: Pt smells  strongly of cat urine.  [name] is a 64 y.o. male who presents to [facility] for his welcome to medicare physical. Pt's Pulmonologist is Dr. [name]. Pt states he does not have a cardiologist at this point, because his previous pulmonologist told him he did not need to return. Pt's rheumatologist is Dr. [name]. Pt states he was taken off of Humera and Celebrex by Dr. [name].   Pt states he drives, and has not gotten lost or had any tickets recently. Pt states he eats sandwiches at home, and does not cook. Pt states he thinks his memory has not changed from baseline. Pt states he drinks 3 liters of water today. Pt does not wake up at night to pea.  Vaccines: Tetanus, May, 2015 Flu Vaccine, December, 2015 Prevnar 13 Today Pt has not received the shingles vaccine, and was diagnosed with shingles in April  Eye Exam: Pt states he is due for an eye exam  Dental Exam: Pt's Dentist is Dr. [name]  Falls: Pt states he has not fallen recently  Depression Risk: Pt denies depression, denies anhedonia. Enjoys watching westerns on TV.  Hearing: No change from baseline. 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  Colonoscopy: Pt has never had one, and refuses a colonoscopy as  well as hemoccult today. Pt states he is willing to do a hemacult at some point in the future.   Living will: Pt does not having a living will but states that if he is in a coma he would prefer to be taken off life support. Pt states he would like to be resuscitated if he is injured and unconscious.  Past Surgical History  Procedure Laterality Date  . Excision mass r lateral arm 2010  2010  . Mass excision  03/08/2011    Procedure: EXCISION MASS;  Surgeon: Cammie Sickle., MD;  Location: [facility];  Service: Orthopedics;  Laterality: Right;  excision rheumatoid nodule right ulna  . Neck surgery    . Eye surgery     Family History    Problem Relation Age of Onset  . Cancer Father   . Arthritis Sister    History   Social History  . Marital Status: Single    Spouse Name: N/A    Number of Children: N/A  . Years of Education: N/A   Occupational History  . retired   .     Social History Main Topics  . Smoking status: Former Smoker -- 0.50 packs/day for 30 years    Quit date: 01/25/2008  . Smokeless tobacco: Never Used  . Alcohol Use: No  . Drug Use: No  . Sexual Activity: No   Other Topics Concern  . None   Social History Narrative     Review of Systems  Constitutional: Negative for fever and chills.  HENT: Positive for congestion.   Respiratory: Positive for shortness of breath (Severe, chronic.).   Cardiovascular: Negative for chest pain.  All other systems reviewed and are negative.      Objective:   Physical Exam  Constitutional: He is oriented to person, place, and time. He appears well-developed and well-nourished.  HENT:  Head: Normocephalic and atraumatic.  Nasal septal deviation to the left. Nasal mucosa pale and boggy.   Eyes: EOM are normal. Pupils are equal, round, and reactive to light.  Neck: Neck supple. No thyromegaly present.  Cardiovascular: Regular rhythm.   No murmur heard. Tachycardic, with rhythm.   Pulmonary/Chest: No respiratory distress.  Lungs with dispersed rales, worse bibasilarly.   Abdominal: Bowel sounds are normal. He exhibits no mass. There is no tenderness.  Possible small umbilical hernia.   Genitourinary: Rectum normal.  Prostate mildly enlarged but no nodules.  Lymphadenopathy:    He has no cervical adenopathy.  Neurological: He is alert and oriented to person, place, and time.  Skin: Skin is warm and dry.  Psychiatric: He has a normal mood and affect. His behavior is normal.  Nursing note and vitals reviewed.  BP 116/74 mmHg  Pulse 99  Temp(Src) 97.3 F (36.3 C)  Resp 20  Ht 5' 9.5" (1.765 m)  Wt 202 lb (91.627 kg)  BMI 29.41 kg/m2  SpO2  94%     [facility] reading (PRIMARY) by  Dr. Brigitte Pulse. EKG: nsr, no ischemic changes. Assessment & Plan:     Need for immunization against influenza - Plan: Pneumococcal conjugate vaccine 13-valent IM, CANCELED: Flu Vaccine QUAD 36+ mos IM (Fluarix)  Annual physical exam - Plan: Microalbumin, urine, PSA, EKG 12-Lead, Pneumococcal conjugate vaccine 13-valent IM, IFOBT POC (occult bld, rslt in office), POCT urinalysis dipstick - psa nml  Type II diabetes mellitus, uncontrolled - Plan: Microalbumin, urine, POCT urinalysis dipstick - MUCH improved control this yr - a1c was 6.1 1 mo ago -  12/18 - so cont current regimen.  + Urine microalb on losartan.  Encounter for Hemoccult screening - Pt refused colonoscopy or yrly HOC after discussion of options for colon cancer screening  Occult blood in stools - although pt had refused yearly HOC, I did collect a HOC in the office after his prostate exam as a matter of routine w/o remembering that pt had declined.  Unforunately it was +. Pt still refuses GI c/s but recommend at least rechecking at next OV.  PULMONARY FIBROSIS ILD POST INFLAMMATORY CHRONIC - Pt needs letter form Dr. Brigitte Pulse stating the he is 100% disabled due to pulmonary fibrosis due to RA, and is also a veteran.  I wrote and handed him in office today. Followed by Dr. Leafy Kindle  Essential hypertension, benign  Hyperlipidemia  Rheumatoid arthritis - seeing Dr. Estanislado Pandy - was weaned off Humira recently s almost all RA expression seems to be confined to his lungs Post herpetic neuralgia  Coronary artery calcification    I personally performed the services described in this documentation, which was scribed in my presence. The recorded information has been reviewed and considered, and addended by me as needed.  Riley Cheadle, MD MPH  Results for orders placed or performed in visit on 04/30/14  Microalbumin, urine  Result Value Ref Range   Microalb, Ur 8.8 (H) <2.0 mg/dL  PSA  Result Value Ref  Range   PSA 0.85 <=4.00 ng/mL  IFOBT POC (occult bld, rslt in office)  Result Value Ref Range   IFOBT Positive

## 2014-05-04 NOTE — Addendum Note (Signed)
Addended by: Elwyn Reach A on: 05/04/2014 04:57 PM   Modules accepted: Orders

## 2014-05-04 NOTE — Addendum Note (Signed)
Addended by: [name] on: 05/04/2014 01:08 AM   Modules accepted: Orders

## 2014-05-12 NOTE — Progress Notes (Signed)
Quick Note:  Pt aware of results. ______ 

## 2014-05-18 ENCOUNTER — Telehealth: Payer: Self-pay

## 2014-05-18 NOTE — Telephone Encounter (Signed)
Pt of Dr. Brigitte Pulse states that the pharmacy has denied his rx for bp medication due to needing prior authorization.

## 2014-05-24 NOTE — Telephone Encounter (Signed)
I spoke with pharmacy, he picked up a 90 day supply already and it went threw his insurance.

## 2014-05-24 NOTE — Telephone Encounter (Signed)
Await fax from pharmacy so I can do the PA.

## 2014-06-29 ENCOUNTER — Encounter: Payer: Self-pay | Admitting: Internal Medicine

## 2014-06-29 ENCOUNTER — Other Ambulatory Visit (INDEPENDENT_AMBULATORY_CARE_PROVIDER_SITE_OTHER): Payer: Medicare Other

## 2014-06-29 ENCOUNTER — Ambulatory Visit (INDEPENDENT_AMBULATORY_CARE_PROVIDER_SITE_OTHER): Payer: Medicare Other | Admitting: Internal Medicine

## 2014-06-29 VITALS — BP 128/66 | HR 104 | Temp 97.5°F | Ht 69.0 in | Wt 202.0 lb

## 2014-06-29 DIAGNOSIS — Z5181 Encounter for therapeutic drug level monitoring: Secondary | ICD-10-CM | POA: Diagnosis not present

## 2014-06-29 DIAGNOSIS — M069 Rheumatoid arthritis, unspecified: Secondary | ICD-10-CM

## 2014-06-29 DIAGNOSIS — J84112 Idiopathic pulmonary fibrosis: Secondary | ICD-10-CM

## 2014-06-29 LAB — CBC
HCT: 40.8 % (ref 39.0–52.0)
HEMOGLOBIN: 13.5 g/dL (ref 13.0–17.0)
MCHC: 33 g/dL (ref 30.0–36.0)
MCV: 89.2 fl (ref 78.0–100.0)
Platelets: 368 10*3/uL (ref 150.0–400.0)
RBC: 4.58 Mil/uL (ref 4.22–5.81)
RDW: 14.1 % (ref 11.5–15.5)
WBC: 15.1 10*3/uL — ABNORMAL HIGH (ref 4.0–10.5)

## 2014-06-29 LAB — BASIC METABOLIC PANEL
BUN: 24 mg/dL — ABNORMAL HIGH (ref 6–23)
CHLORIDE: 103 meq/L (ref 96–112)
CO2: 26 mEq/L (ref 19–32)
Calcium: 9.9 mg/dL (ref 8.4–10.5)
Creatinine, Ser: 1.18 mg/dL (ref 0.40–1.50)
GFR: 65.78 mL/min (ref >60.00)
Glucose, Bld: 146 mg/dL — ABNORMAL HIGH (ref 70–99)
Potassium: 4.3 mEq/L (ref 3.5–5.1)
Sodium: 137 mEq/L (ref 135–145)

## 2014-06-29 LAB — HEPATIC FUNCTION PANEL
ALT: 16 U/L (ref 0–53)
AST: 15 U/L (ref 0–37)
Albumin: 4.1 g/dL (ref 3.5–5.2)
Alkaline Phosphatase: 98 U/L (ref 39–117)
BILIRUBIN DIRECT: 0.1 mg/dL (ref 0.0–0.3)
BILIRUBIN TOTAL: 0.3 mg/dL (ref 0.2–1.2)
Total Protein: 7.9 g/dL (ref 6.0–8.3)

## 2014-06-29 MED ORDER — MYCOPHENOLATE MOFETIL 500 MG PO TABS: ORAL_TABLET | ORAL | Status: DC

## 2014-06-29 MED ORDER — SULFAMETHOXAZOLE-TRIMETHOPRIM 800-160 MG PO TABS: 1.0000 | ORAL_TABLET | ORAL | Status: DC

## 2014-06-29 NOTE — Progress Notes (Signed)
Subjective:    Patient ID: [name], male    DOB: 02-24-1951, 64 y.o.   MRN: [id number]  HPI     PC Galen Daft, MD Rheum: Dr Bo Merino  HPI  IOV 12/03/2013    Chief Complaint  Patient presents with  . Pulmonary Consult    Referred by Dr. Estanislado Pandy for ILD.    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 He is an extremely poor historian. Information is gathered from him, outside records, [facility] interstitial lung disease clinic 2013 notes  As best as I can tell patient has rheumatoid arthritis since approximately 2010. He is being treated with Celebrex and Humira for possibly the same amount of time. His symptoms are largely limited to the joints. Apparently these medications to control his joint pain. 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 He was [facility] by Dr. Wynn Maudlin. Last visit was recorded in the spring of 2013 ital capacity was 58% and diffusion capacity was 40% consistent with moderate interstitial lung disease. Patient himself reports moderate of cough and moderate amount of dyspnea. He says that both these symptoms only impacting his quality of life to a mild extent and is able to live well despite these symptoms. In the office he desaturated immediately after standing up but despite this he feels that his symptoms are impacting his quality of life a little mild extent.  He would not tell me why he switched from [facility] but it appears that probably because he lives alone  Review of this imaging shows that he's had pulmonary fibrosis at least as of 2005 which means it predates the diagnoses of rheumatoid arthritis which is typically the case in many men  CT scan of the chest 06/28/2010: In my opinion has classic UIP features with significant  honeycombing. This is very similar to a CT scan of the chest in September 2009 and February 2005 but likely worse compared to 2005  In terms of his rheumatoid arthritis according to outside notes his positive rheumatoid factor and positive CCP. He does not have any synovitis on exam as of July 2015.   Other issues    - . reports that he quit smoking about 5 years ago. He has never used smokeless tobacco.  - [facility] records suggest that he had right middle lobe nodule that had moderate activity on PET scan in 2013. These have not been followed up since then. Patient completely denies knowledge of these  REC #Pulmonary FIbrosis - UIP due to Rheumatoid ARthritis  - this is due to Rheumatoid Arthritis  - start portable oxygen system 2L [place] with walking; will help you - do HRCT wo contrast - do office spirometry at followup   #Lung nodules seen on CT in 2013 at [place]  - do HRCT wo contrast  #Followup 1 month - after completing above Office Spirometry (not June [place]ap) at time of followup   OV 12/31/2013  Chief Complaint  Patient presents with  . Follow-up    Pt states his breathing has improved since last visit. Pt states his SOB has improved. Pt c/o intermittent dry cough. Pt denies CP/tightness.    HEre to review results. Poor hx with RA and UIP. COncern is that UIP is progresslive.  HE feels fine. AGain, talks very little. Spirometry compared to 2013  shows progressive disease; FVC now is 51% but was 58% in 2013. CT chest sept 2015 also shows progressive disease compared to 2012. HE says he does not feel the progression. HE is open to listening to my recommendations. He is willing to try cellcept to slow progression of disease    SPirro 12/31/2013  - fvc  2.3L/51%, fev1 1.99L/57%, ratio 87   CT chest 12/10/13: COMPARISON: Chest CT 06/28/2010 IMPRESSION:  1. The appearance of the chest is most compatible with progressively  worsening usual interstitial pneumonia (UIP), as discussed  above.   2. Multiple borderline enlarged and mildly enlarged mediastinal and  bilateral hilar lymph nodes are presumably benign and reactive in  the setting of chronic interstitial lung disease.   3. Atherosclerosis, including left circumflex coronary artery  disease. Please note that although the presence of coronary artery  calcium documents the presence of coronary artery disease, the  severity of this disease and any potential stenosis cannot be  assessed on this non-gated CT examination. Assessment for potential  risk factor modification, dietary therapy or pharmacologic therapy  may be warranted, if clinically indicated.  Electronically Signed  By: Vinnie Langton M.D.  On: 12/10/2013 15:26    REC Start cellcept  01/29/2014 Follow up (Rheumatoid arthritis related UIP type of pulmonary fibrosis.) Returns for follow up .  Started on Cellcept last ov, has been on for last 1-2 weeks, took a while to  [employer].  Was seen 2 days ago by PCP dx w/ bronchitis , tx w/ Levaquin  CXR w/ no acute changes.  Says cough and congestion are getting better Denies chest pain, orthopnea, edema or fever.      OV 03/29/2014  Chief Complaint  Patient presents with  . Follow-up    Pt stated he has more energy since last OV. Pt c/o DOE, mild dry cough. Pt denies CP/tightness.     Follow-up rheumatoid arthritis interstitial lung disease UIP pattern  I started CellCept early October 2015. After this he followed up with my nurse practitioner in November 2015. Between those 2 visits he did have some bronchitis and was treated with Levaquin. He says that he is feeling better in terms of his dyspnea and cough. He still continues to smoke. His last lab work was November 2015 and normal. In December 2015 I did speak with his rheumatologist who has determined that his rheumatoid arthritis is largely confined to the lungs and over time she will slowly stop his Humira. Because he is already  on too strong immunomodulators of Humira and CellCept we have held off on starting prednisone at this stage. He is due for lab work right now. He has not had pulmonary function test. He uses his oxygen nasal cannula. He is interested in OXYGO system, his current CellCept dose is 500mg  twice dail; not been increased yet   OV 06/29/2014  Chief Complaint  Patient presents with  . Follow-up    Pt states breathing is still the same. c/o cough with small amount of mucus, white in color.  Denies any wheezing, chest tightness/congestion.     Follow-up rheumatoid arthritis interstitial lung disease UIP pattern without evidence of systemic RA. I started CellCept early October 2015; 500mg  bid + bactrim (not on prednisone because was being weaned off humira). Overall stable. Has baseline cough, and mucus. He smells of tobacco - entire room does but he denies he is actively smoking. Says smell is from friends he hangs out who smoke. 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 OFf humira I suppose. Not yet on prednisone. He is open to increasing dose of cellcept  LABS resulted afte he left   Recent Labs Lab 06/29/14 1225  HGB 13.5  HCT 40.8  WBC 15.1*  PLT 368.0    Recent Labs Lab 06/29/14 1225  NA 137  K 4.3  CL 103  CO2 26  GLUCOSE 146*  BUN 24*  CREATININE 1.18  CALCIUM 9.9    Recent Labs Lab 06/29/14 1225  AST 15  ALT 16  ALKPHOS 98  BILITOT 0.3  PROT 7.9  ALBUMIN 4.1       has a past medical history of Rheumatoid arthritis(714.0) (2005); Pulmonary fibrosis; Hypertension; GERD (gastroesophageal reflux disease); Diabetes mellitus without complication; and Dyspnea.   reports that he quit smoking about 6 years ago. He has never used smokeless tobacco.  Past Surgical History  Procedure Laterality Date  . Excision mass r lateral arm 2010  2010  . Mass excision   03/08/2011    Procedure: EXCISION MASS;  Surgeon: Cammie Sickle., MD;  Location: [place];  Service: Orthopedics;  Laterality: Right;  excision rheumatoid nodule right ulna  . Neck surgery    . Eye surgery      No Known Allergies  Immunization History  Administered Date(s) Administered  . Pneumococcal Conjugate-13 04/30/2014  . Tdap 08/14/2013    Family History  Problem Relation Age of Onset  . Cancer Father   . Arthritis Sister      Current outpatient prescriptions:  .  albuterol (VENTOLIN HFA) 108 (90 BASE) MCG/ACT inhaler, Inhale 2 puffs into the lungs every 4 (four) hours as needed for wheezing or shortness of breath., Disp: 8.5 each, Rfl: 11 .  aspirin 81 MG tablet, Take 81 mg by mouth daily., Disp: , Rfl:  .  gabapentin (NEURONTIN) 400 MG capsule, TAKE 2 CAPSULES (800 MG TOTAL) BY MOUTH 3 (THREE) TIMES DAILY., Disp: 180 capsule, Rfl: 1 .  glucose blood (ONE TOUCH ULTRA TEST) test strip, Test blood sugar daily. Dx code: E11.65, Disp: 100 each, Rfl: 2 .  ipratropium-albuterol (DUONEB) 0.5-2.5 (3) MG/3ML SOLN, Take 3 mLs by nebulization every 4 (four) hours as needed., Disp: 360 mL, Rfl: 21 .  losartan (COZAAR) 50 MG tablet, TAKE 1 TABLET BY MOUTH ONCE DAILY FOR BLOOD PRESSURE, Disp: 90 tablet, Rfl: 1 .  metFORMIN (GLUCOPHAGE) 1000 MG tablet, Take 1 tablet (1,000 mg total) by mouth 2 (two) times daily with a meal. FOR DIABETES, Disp: 180 tablet, Rfl: 1 .  mycophenolate (CELLCEPT) 500 MG tablet, TAKE 1 TABLET TWICE DAILY 1 HOUR BEFORE OR 2 HOURS  AFTER MEALS, Disp: 60 tablet, Rfl: 5 .  pravastatin (PRAVACHOL) 40 MG tablet, Take 1 tablet (40 mg total) by mouth at bedtime. FOR CHOLESTEROL, Disp: 90 tablet, Rfl: 1 .  sulfamethoxazole-trimethoprim (BACTRIM DS,SEPTRA DS) 800-160 MG per tablet, Take 1 tablet by mouth 3 (three) times a week., Disp: 12 tablet, Rfl: 5     Review of Systems  Constitutional: Positive for fatigue. Negative for fever and unexpected  weight change.  HENT: Negative for congestion, mouth sores, nosebleeds, postnasal drip, sinus pressure, sneezing, sore throat and trouble swallowing.   Respiratory: Positive for shortness of breath. Negative for cough, chest tightness and wheezing.   Cardiovascular: Negative for chest pain, palpitations and leg swelling.  Gastrointestinal: Negative for  abdominal pain.  Genitourinary: Negative for difficulty urinating.       Objective:   Physical Exam  Filed Vitals:   06/29/14 1152  BP: 128/66  Pulse: 104  Temp: 97.5 F (36.4 C)  TempSrc: Oral  Height: 5\' 9"  (1.753 m)  Weight: 202 lb (91.627 kg)  SpO2: 93%      Physical Exam GEN: A/Ox3; pleasant , NAD, dishelved SMELLS OF TOBACCO  Psych: poor historian  HEENT:  [place]/AT,  EACs-clear, TMs-wnl, NOSE-clear, THROAT-clear, no lesions, no postnasal drip or exudate noted.   NECK:  Supple w/ fair ROM; no JVD; normal carotid impulses w/o bruits; no thyromegaly or nodules palpated; no lymphadenopathy.  RESP  Bibasilar crackles no accessory muscle use, no dullness to percussion  CARD:  RRR, no m/r/g  , no peripheral edema, pulses intact, no cyanosis or clubbing.  GI:   Soft & nt; nml bowel sounds; no organomegaly or masses detected.  Musco: Warm bil, no deformities or joint swelling noted.   Neuro: alert, no focal deficits noted.    Skin: Warm, no lesions or rashes            Assessment & Plan:     ICD-9-CM ICD-10-CM   1. UIP (usual interstitial pneumonitis) 515 J84.112 CBC     Basic Metabolic Panel (BMET)     Hepatic function panel  2. Rheumatoid arthritis 714.0 M06.9 CBC     Basic Metabolic Panel (BMET)     Hepatic function panel  3. Encounter for therapeutic drug monitoring V58.83 Z51.81 CBC     Basic Metabolic Panel (BMET)     Hepatic function panel  '  Clinically stable Blood work ok Increase cellcept to 1000mg  bid COntinue bactrim Continue o2  ROV 1 month for cellcept check and lab work Then 2-3 months  for PFTs and decision on prednisone   Dr. Brand Males, M.D., [facility].C.P Pulmonary and Critical Care Medicine Staff Physician [facility] Pager: [phone], If no answer or between  15:00h - 7:00h: call [phone]  06/29/2014 9:53 PM

## 2014-06-29 NOTE — Patient Instructions (Addendum)
ICD-9-CM ICD-10-CM   1. 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 Rheumatoid arthritis 714.0 M06.9   3. Encounter for therapeutic drug monitoring V58.83 Z51.81      Glad you are feeling stable Do blood work CBC, BMEt, LFT - will call with results Continue cellcept but increase to  1000mg  twice daily Continue Bactrim 1 tab Monday, wed Friday Use oxugen OXYGO system   Followup REturn to see me or NP Tammy in  1 months  At followup will need repeat blood work and spirometry At [facility] will need to order PFT and CT in 2-3 months from time of followup

## 2014-07-05 NOTE — Progress Notes (Signed)
Quick Note:  Called and spoke to pt. Informed pt of the results and recs per MR. Pt stated he is taking the 1000mg  BID of cellcept. Nothing further needed. ______

## 2014-07-16 ENCOUNTER — Ambulatory Visit (INDEPENDENT_AMBULATORY_CARE_PROVIDER_SITE_OTHER): Payer: Medicare Other | Admitting: Family Medicine

## 2014-07-16 ENCOUNTER — Encounter: Payer: Self-pay | Admitting: Family Medicine

## 2014-07-16 VITALS — BP 111/72 | HR 109 | Temp 97.9°F | Resp 20 | Ht 69.0 in | Wt 201.0 lb

## 2014-07-16 DIAGNOSIS — Z79899 Other long term (current) drug therapy: Secondary | ICD-10-CM | POA: Diagnosis not present

## 2014-07-16 DIAGNOSIS — E1142 Type 2 diabetes mellitus with diabetic polyneuropathy: Secondary | ICD-10-CM | POA: Diagnosis not present

## 2014-07-16 DIAGNOSIS — I2584 Coronary atherosclerosis due to calcified coronary lesion: Secondary | ICD-10-CM | POA: Diagnosis not present

## 2014-07-16 DIAGNOSIS — I2510 Atherosclerotic heart disease of native coronary artery without angina pectoris: Secondary | ICD-10-CM

## 2014-07-16 DIAGNOSIS — B0229 Other postherpetic nervous system involvement: Secondary | ICD-10-CM

## 2014-07-16 DIAGNOSIS — G4700 Insomnia, unspecified: Secondary | ICD-10-CM | POA: Diagnosis not present

## 2014-07-16 DIAGNOSIS — D899 Disorder involving the immune mechanism, unspecified: Secondary | ICD-10-CM

## 2014-07-16 DIAGNOSIS — D849 Immunodeficiency, unspecified: Secondary | ICD-10-CM | POA: Insufficient documentation

## 2014-07-16 DIAGNOSIS — I10 Essential (primary) hypertension: Secondary | ICD-10-CM | POA: Diagnosis not present

## 2014-07-16 DIAGNOSIS — E1110 Type 2 diabetes mellitus with ketoacidosis without coma: Secondary | ICD-10-CM | POA: Insufficient documentation

## 2014-07-16 LAB — POCT GLYCOSYLATED HEMOGLOBIN (HGB A1C): HEMOGLOBIN A1C: 6.1

## 2014-07-16 MED ORDER — AMITRIPTYLINE HCL 25 MG PO TABS: 25.0000 mg | ORAL_TABLET | Freq: Every day | ORAL | Status: DC

## 2014-07-16 NOTE — Patient Instructions (Signed)
Postherpetic Neuralgia Postherpetic neuralgia (PHN) is nerve pain that occurs after a shingles infection. Shingles is a painful rash that appears on one side of the body, usually on your trunk or face. Shingles is caused by the varicella-zoster virus. This is the same virus that causes chickenpox. In people who have had chickenpox, the virus can resurface years later and cause shingles. You may have PHN if you continue to have pain for 3 months after your shingles rash has gone away. PHN appears in the same area where you had the shingles rash. For most people, PHN goes away within 1 year.  Getting a vaccination for shingles can prevent PHN. This vaccine is recommended for people older than 50. It may prevent shingles and may also lower your risk of PHN if you do get shingles. CAUSES PHN is caused by damage to your nerves from the varicella-zoster virus. This damage makes your nerves overly sensitive.  RISK FACTORS Aging is the biggest risk factor for developing PHN. Most people who get PHN are older than 26. Other risk factors include:  Having very bad pain before your shingles rash starts.  Having a very bad rash.  Having shingles in the nerve that supplies your face and eye (trigeminal nerve). SIGNS AND SYMPTOMS Pain is the main symptom of PHN. The pain is often very bad and may be described as stabbing, burning, or feeling like an electric shock. The pain may come and go or may be there all the time. Pain may be triggered by light touches on the skin or changes in temperature. You may have itching along with the pain. DIAGNOSIS  Your health care provider may diagnose PHN based on your symptoms and your history of shingles. Lab studies and other diagnostic tests are usually not needed. TREATMENT  There is no cure for PHN. Treatment for PHN will focus on pain relief. Over-the-counter pain relievers do not usually relieve PHN pain. You may need to work with a pain specialist. Treatment may  include:  Antidepressant medicines to help with pain and improve sleep.  Antiseizure medicines to relieve nerve pain.  Strong pain relievers (opioids).  A numbing patch worn on the skin (lidocaine patch). HOME CARE INSTRUCTIONS It may take a long time to recover from PHN. Work closely with your health care provider, and have a good support system at home.   Take all medicines as directed by your health care provider.  Wear loose, comfortable clothing.  Cover sensitive areas with a dressing to reduce friction from clothing rubbing on the area.  If cold does not make your pain worse, try applying a cool compress or cooling gel pack to the area.  Talk to your health care provider if you feel depressed or desperate. Living with long-term pain can be depressing. SEEK MEDICAL CARE IF:  Your medicine is not helping.  You are struggling to manage your pain at home. Document Released: 06/02/2002 Document Revised: 07/27/2013 Document Reviewed: 03/03/2013 [facility] Patient Information 2015 [place], [place]. This information is not intended to replace advice given to you by your health care provider. Make sure you discuss any questions you have with your health care provider. Diabetes Mellitus and Food It is important for you to manage your blood sugar (glucose) level. Your blood glucose level can be greatly affected by what you eat. Eating healthier foods in the appropriate amounts throughout the day at about the same time each day will help you control your blood glucose level. It can also help slow  or prevent worsening of your diabetes mellitus. Healthy eating may even help you improve the level of your blood pressure and reach or maintain a healthy weight.  HOW CAN FOOD AFFECT ME? Carbohydrates Carbohydrates affect your blood glucose level more than any other type of food. Your dietitian will help you determine how many carbohydrates to eat at each meal and teach you how to count carbohydrates.  Counting carbohydrates is important to keep your blood glucose at a healthy level, especially if you are using insulin or taking certain medicines for diabetes mellitus. Alcohol Alcohol can cause sudden decreases in blood glucose (hypoglycemia), especially if you use insulin or take certain medicines for diabetes mellitus. Hypoglycemia can be a life-threatening condition. Symptoms of hypoglycemia (sleepiness, dizziness, and disorientation) are similar to symptoms of having too much alcohol.  If your health care provider has given you approval to drink alcohol, do so in moderation and use the following guidelines:  Women should not have more than one drink per day, and men should not have more than two drinks per day. One drink is equal to:  12 oz of beer.  5 oz of wine.  1 oz of hard liquor.  Do not drink on an empty stomach.  Keep yourself hydrated. Have water, diet soda, or unsweetened iced tea.  Regular soda, juice, and other mixers might contain a lot of carbohydrates and should be counted. WHAT FOODS ARE NOT RECOMMENDED? As you make food choices, it is important to remember that all foods are not the same. Some foods have fewer nutrients per serving than other foods, even though they might have the same number of calories or carbohydrates. It is difficult to get your body what it needs when you eat foods with fewer nutrients. Examples of foods that you should avoid that are high in calories and carbohydrates but low in nutrients include:  Trans fats (most processed foods list trans fats on the Nutrition Facts label).  Regular soda.  Juice.  Candy.  Sweets, such as cake, pie, doughnuts, and cookies.  Fried foods. WHAT FOODS CAN I EAT? Have nutrient-rich foods, which will nourish your body and keep you healthy. The food you should eat also will depend on several factors, including:  The calories you need.  The medicines you take.  Your weight.  Your blood glucose  level.  Your blood pressure level.  Your cholesterol level. You also should eat a variety of foods, including:  Protein, such as meat, poultry, fish, tofu, nuts, and seeds (lean animal proteins are best).  Fruits.  Vegetables.  Dairy products, such as milk, cheese, and yogurt (low fat is best).  Breads, grains, pasta, cereal, rice, and beans.  Fats such as olive oil, trans fat-free margarine, canola oil, avocado, and olives. DOES EVERYONE WITH DIABETES MELLITUS HAVE THE SAME MEAL PLAN? Because every person with diabetes mellitus is different, there is not one meal plan that works for everyone. It is very important that you meet with a dietitian who will help you create a meal plan that is just right for you. Document Released: 12/07/2004 Document Revised: 03/17/2013 Document Reviewed: 02/06/2013 [facility] Patient Information 2015 [place], [place]. This information is not intended to replace advice given to you by your health care provider. Make sure you discuss any questions you have with your health care provider.

## 2014-07-16 NOTE — Progress Notes (Signed)
Subjective:    Patient ID: [name], male    DOB: February 23, 1951, 64 y.o.   MRN: [id number] This chart was scribed for [name], MD by Zola Button, Medical Scribe. This patient was seen in Room 25 and the patient's care was started at 1:48 PM.  Chief Complaint  Patient presents with  . Follow-up    pt stated he didnt know why he was here  . Hypertension    HPI HPI Comments: [name] is a 64 y.o. male who presents to the [facility] for a follow-up.  He had DM diagnosed about a year and a half ago. He has done very well with his sugars and decreasing his medication doses. He does have a pneumonitis, for which he sees Dr. Chase Caller and Dr. Estanislado Pandy for interstitial lung disease with fibrosis. He also has rheumatoid arthritis. He was recently weaned off Humira and placed on CellCept. His DM had dramatically worsened on prednisone, but is now off. His last HbA1c 4 months ago was 6.1. His last urine micro albumin 2 months ago was mildly elevated at 8.8. He is on losartan. Lipid panel was done August, 2015 and was at goal. Patient states he has an upcoming appointment with Dr. Chase Caller, but he is unsure when.   His CBG have been around 100-125 two hours after eating, but they are sometimes in the 90s. He has not had any problems with his metformin. He denies any hypoglycemic symptoms. Patient takes aspirin daily. He has done more walking for exercising. He sees Dr. Sherral Hammers for eye care and last saw him a few months ago; he was told he had cataracts in his right eye. Patient denies any visual changes. He does not take fish oil, OTC medications or vitamins. Patient denies nocturia, myalgias and bowel issues.   Patient notes he still has some bearable pain from the last outbreak of shingles. He describes the pain as similar to a sunburn. He requests a light sleeping pill.  Past Medical History  Diagnosis Date  . Rheumatoid arthritis(714.0) 2005  . Pulmonary fibrosis     . Hypertension   . GERD (gastroesophageal reflux disease)   . Diabetes mellitus without complication   . Dyspnea    Current Outpatient Prescriptions on File Prior to Visit  Medication Sig Dispense Refill  . albuterol (VENTOLIN HFA) 108 (90 BASE) MCG/ACT inhaler Inhale 2 puffs into the lungs every 4 (four) hours as needed for wheezing or shortness of breath. 8.5 each 11  . aspirin 81 MG tablet Take 81 mg by mouth daily.    Marland Kitchen gabapentin (NEURONTIN) 400 MG capsule TAKE 2 CAPSULES (800 MG TOTAL) BY MOUTH 3 (THREE) TIMES DAILY. 180 capsule 1  . glucose blood (ONE TOUCH ULTRA TEST) test strip Test blood sugar daily. Dx code: E11.65 100 each 2  . ipratropium-albuterol (DUONEB) 0.5-2.5 (3) MG/3ML SOLN Take 3 mLs by nebulization every 4 (four) hours as needed. 360 mL 21  . losartan (COZAAR) 50 MG tablet TAKE 1 TABLET BY MOUTH ONCE DAILY FOR BLOOD PRESSURE 90 tablet 1  . metFORMIN (GLUCOPHAGE) 1000 MG tablet Take 1 tablet (1,000 mg total) by mouth 2 (two) times daily with a meal. FOR DIABETES 180 tablet 1  . mycophenolate (CELLCEPT) 500 MG tablet TAKE 1 TABLET TWICE DAILY 1 HOUR BEFORE OR 2 HOURS  AFTER MEALS 120 tablet 5  . pravastatin (PRAVACHOL) 40 MG tablet Take 1 tablet (40 mg total) by mouth at bedtime. FOR CHOLESTEROL  90 tablet 1   No current facility-administered medications on file prior to visit.   No Known Allergies   Review of Systems  Constitutional: Negative for fever, chills, diaphoresis, activity change, appetite change, fatigue and unexpected weight change.  Eyes: Negative for visual disturbance.  Respiratory: Positive for cough and shortness of breath. Negative for wheezing.   Cardiovascular: Negative for leg swelling.  Gastrointestinal: Negative.  Negative for nausea and vomiting.  Endocrine: Negative for polydipsia, polyphagia and polyuria.  Musculoskeletal: Positive for back pain, arthralgias and gait problem. Negative for myalgias.  Skin: Negative for rash.   Allergic/Immunologic: Positive for immunocompromised state.  Neurological: Positive for weakness and numbness. Negative for tremors and headaches.  Psychiatric/Behavioral: Positive for sleep disturbance. The patient is not nervous/anxious.        Objective:  BP 111/72 mmHg  Pulse 109  Temp(Src) 97.9 F (36.6 C)  Resp 20  Ht '5\' 9"'  (1.753 m)  Wt 201 lb (91.173 kg)  BMI 29.67 kg/m2  SpO2 85%  Physical Exam  Constitutional: He is oriented to person, place, and time. He appears well-developed and well-nourished. No distress.  HENT:  Head: Normocephalic and atraumatic.  Mouth/Throat: Oropharynx is clear and moist. No oropharyngeal exudate.  Eyes: Pupils are equal, round, and reactive to light.  Neck: Neck supple.  Cardiovascular: Regular rhythm, S1 normal, S2 normal and normal heart sounds.  Tachycardia present.   No murmur heard. Pulmonary/Chest: Effort normal. He has rales.  Musculoskeletal: He exhibits no edema.  Neurological: He is alert and oriented to person, place, and time. No cranial nerve deficit.  Skin: Skin is warm and dry. No rash noted.  Psychiatric: He has a normal mood and affect. His behavior is normal.  Vitals reviewed.         Assessment & Plan:  Send labs to Dr. Estanislado Pandy. He will need labs repeated every 2 months for the 6 months and is welcome to come to our clinic or an outpatient lab if he prefers.  1. Polypharmacy   2. Type 2 diabetes mellitus with diabetic polyneuropathy   3. Post herpetic neuralgia - try low dose elavil qhs  4. Immunosuppressed status   5. Essential hypertension, benign   6. Coronary artery calcification   7. Insomnia     Orders Placed This Encounter  Procedures  . Comprehensive metabolic panel    Please send copy to Dr. Estanislado Pandy as well  . CBC with Differential/Platelet    Please send copy to Dr. Estanislado Pandy as well  . POCT glycosylated hemoglobin (Hb A1C)    Meds ordered this encounter  Medications  . amitriptyline  (ELAVIL) 25 MG tablet    Sig: Take 1 tablet (25 mg total) by mouth at bedtime. Increase to 2 tabs/night after 2 weeks    Dispense:  60 tablet    Refill:  5    I personally performed the services described in this documentation, which was scribed in my presence. The recorded information has been reviewed and considered, and addended by me as needed.  [name], MD MPH  Results for orders placed or performed in visit on 07/16/14  Comprehensive metabolic panel  Result Value Ref Range   Sodium 133 (L) 135 - 145 mEq/L   Potassium 4.6 3.5 - 5.3 mEq/L   Chloride 104 96 - 112 mEq/L   CO2 19 19 - 32 mEq/L   Glucose, Bld 131 (H) 70 - 99 mg/dL   BUN 24 (H) 6 - 23 mg/dL   Creat 1.01  0.50 - 1.35 mg/dL   Total Bilirubin 0.2 0.2 - 1.2 mg/dL   Alkaline Phosphatase 80 39 - 117 U/L   AST 15 0 - 37 U/L   ALT 16 0 - 53 U/L   Total Protein 7.0 6.0 - 8.3 g/dL   Albumin 3.9 3.5 - 5.2 g/dL   Calcium 9.2 8.4 - 10.5 mg/dL  CBC with Differential/Platelet  Result Value Ref Range   WBC 10.9 (H) 4.0 - 10.5 K/uL   RBC 4.43 4.22 - 5.81 MIL/uL   Hemoglobin 13.3 13.0 - 17.0 g/dL   HCT 38.6 (L) 39.0 - 52.0 %   MCV 87.1 78.0 - 100.0 fL   MCH 30.0 26.0 - 34.0 pg   MCHC 34.5 30.0 - 36.0 g/dL   RDW 13.8 11.5 - 15.5 %   Platelets 348 150 - 400 K/uL   MPV 9.4 8.6 - 12.4 fL   Neutrophils Relative % 63 43 - 77 %   Neutro Abs 6.9 1.7 - 7.7 K/uL   Lymphocytes Relative 21 12 - 46 %   Lymphs Abs 2.3 0.7 - 4.0 K/uL   Monocytes Relative 10 3 - 12 %   Monocytes Absolute 1.1 (H) 0.1 - 1.0 K/uL   Eosinophils Relative 5 0 - 5 %   Eosinophils Absolute 0.5 0.0 - 0.7 K/uL   Basophils Relative 1 0 - 1 %   Basophils Absolute 0.1 0.0 - 0.1 K/uL   Smear Review Criteria for review not met   POCT glycosylated hemoglobin (Hb A1C)  Result Value Ref Range   Hemoglobin A1C 6.1

## 2014-07-17 LAB — COMPREHENSIVE METABOLIC PANEL
ALK PHOS: 80 U/L (ref 39–117)
ALT: 16 U/L (ref 0–53)
AST: 15 U/L (ref 0–37)
Albumin: 3.9 g/dL (ref 3.5–5.2)
BUN: 24 mg/dL — AB (ref 6–23)
CO2: 19 mEq/L (ref 19–32)
Calcium: 9.2 mg/dL (ref 8.4–10.5)
Chloride: 104 mEq/L (ref 96–112)
Creat: 1.01 mg/dL (ref 0.50–1.35)
GLUCOSE: 131 mg/dL — AB (ref 70–99)
Potassium: 4.6 mEq/L (ref 3.5–5.3)
SODIUM: 133 meq/L — AB (ref 135–145)
Total Bilirubin: 0.2 mg/dL (ref 0.2–1.2)
Total Protein: 7 g/dL (ref 6.0–8.3)

## 2014-07-17 LAB — CBC WITH DIFFERENTIAL/PLATELET
BASOS ABS: 0.1 10*3/uL (ref 0.0–0.1)
Basophils Relative: 1 % (ref 0–1)
Eosinophils Absolute: 0.5 10*3/uL (ref 0.0–0.7)
Eosinophils Relative: 5 % (ref 0–5)
HCT: 38.6 % — ABNORMAL LOW (ref 39.0–52.0)
Hemoglobin: 13.3 g/dL (ref 13.0–17.0)
Lymphocytes Relative: 21 % (ref 12–46)
Lymphs Abs: 2.3 10*3/uL (ref 0.7–4.0)
MCH: 30 pg (ref 26.0–34.0)
MCHC: 34.5 g/dL (ref 30.0–36.0)
MCV: 87.1 fL (ref 78.0–100.0)
MPV: 9.4 fL (ref 8.6–12.4)
Monocytes Absolute: 1.1 10*3/uL — ABNORMAL HIGH (ref 0.1–1.0)
Monocytes Relative: 10 % (ref 3–12)
NEUTROS PCT: 63 % (ref 43–77)
Neutro Abs: 6.9 10*3/uL (ref 1.7–7.7)
Platelets: 348 10*3/uL (ref 150–400)
RBC: 4.43 MIL/uL (ref 4.22–5.81)
RDW: 13.8 % (ref 11.5–15.5)
WBC: 10.9 10*3/uL — ABNORMAL HIGH (ref 4.0–10.5)

## 2014-07-19 ENCOUNTER — Telehealth: Payer: Self-pay

## 2014-07-19 NOTE — Telephone Encounter (Signed)
Patient request for Dr. Brigitte Pulse to complete a tax form. Patient didn't give me any details on the form. I will place the tax form in the nurse's box at 102. Thanks. [phone]

## 2014-07-19 NOTE — Telephone Encounter (Signed)
Form placed in your box Dr Brigitte Pulse.

## 2014-07-20 ENCOUNTER — Telehealth: Payer: Self-pay | Admitting: Internal Medicine

## 2014-07-20 NOTE — Telephone Encounter (Signed)
Patient Instructions       ICD-9-CM ICD-10-CM   1. 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 Rheumatoid arthritis 714.0 M06.9   3. Encounter for therapeutic drug monitoring V58.83 Z51.81      Glad you are feeling stable Do blood work CBC, BMEt, LFT - will call with results Continue cellcept but increase to '1000mg'$  twice daily Continue Bactrim 1 tab Monday, wed Friday Use oxugen OXYGO system  Followup REturn to see me or NP Tammy in 1 months  At followup will need repeat blood work and spirometry At fu will need to order PFT and CT in 2-3 months from time of followup

## 2014-07-20 NOTE — Telephone Encounter (Signed)
[name]

## 2014-07-21 NOTE — Telephone Encounter (Signed)
Signed and returned

## 2014-07-21 NOTE — Telephone Encounter (Signed)
lmtch x2 for pt.

## 2014-07-22 MED ORDER — MYCOPHENOLATE MOFETIL 500 MG PO TABS: ORAL_TABLET | ORAL | Status: DC

## 2014-07-22 NOTE — Telephone Encounter (Signed)
Called pt and is aware new RX has been sent to [facility]. Nothing further needed

## 2014-07-22 NOTE — Telephone Encounter (Signed)
[phone] calling back

## 2014-07-23 ENCOUNTER — Telehealth: Payer: Self-pay

## 2014-07-23 NOTE — Telephone Encounter (Signed)
No prior auth required

## 2014-07-23 NOTE — Telephone Encounter (Signed)
PA started for Amitriptyline 25 mg.

## 2014-08-02 ENCOUNTER — Ambulatory Visit: Payer: Medicare Other | Admitting: Internal Medicine

## 2014-08-02 NOTE — Telephone Encounter (Signed)
Received another PA req on this from [employer]. Called pharm and advised that we had done a PA and was told by ins that no PA was needed. Pharm stated she will see what they can do to see about getting it to go through.

## 2014-09-10 ENCOUNTER — Other Ambulatory Visit: Payer: Self-pay | Admitting: Family Medicine

## 2014-09-28 ENCOUNTER — Encounter: Payer: Self-pay | Admitting: Internal Medicine

## 2014-09-28 ENCOUNTER — Other Ambulatory Visit (INDEPENDENT_AMBULATORY_CARE_PROVIDER_SITE_OTHER): Payer: Medicare Other

## 2014-09-28 ENCOUNTER — Ambulatory Visit (INDEPENDENT_AMBULATORY_CARE_PROVIDER_SITE_OTHER): Payer: Medicare Other | Admitting: Internal Medicine

## 2014-09-28 VITALS — BP 118/70 | HR 120 | Ht 69.0 in | Wt 200.4 lb

## 2014-09-28 DIAGNOSIS — Z5181 Encounter for therapeutic drug level monitoring: Secondary | ICD-10-CM

## 2014-09-28 DIAGNOSIS — Z79899 Other long term (current) drug therapy: Secondary | ICD-10-CM

## 2014-09-28 DIAGNOSIS — M069 Rheumatoid arthritis, unspecified: Secondary | ICD-10-CM

## 2014-09-28 DIAGNOSIS — J84112 Idiopathic pulmonary fibrosis: Secondary | ICD-10-CM

## 2014-09-28 LAB — HEPATIC FUNCTION PANEL
ALT: 15 U/L (ref 0–53)
AST: 15 U/L (ref 0–37)
Albumin: 4 g/dL (ref 3.5–5.2)
Alkaline Phosphatase: 84 U/L (ref 39–117)
BILIRUBIN DIRECT: 0.1 mg/dL (ref 0.0–0.3)
BILIRUBIN TOTAL: 0.4 mg/dL (ref 0.2–1.2)
TOTAL PROTEIN: 7.7 g/dL (ref 6.0–8.3)

## 2014-09-28 LAB — BASIC METABOLIC PANEL
BUN: 25 mg/dL — ABNORMAL HIGH (ref 6–23)
CALCIUM: 9.4 mg/dL (ref 8.4–10.5)
CO2: 22 mEq/L (ref 19–32)
CREATININE: 1.17 mg/dL (ref 0.40–1.50)
Chloride: 104 mEq/L (ref 96–112)
GFR: 66.38 mL/min (ref >60.00)
Glucose, Bld: 110 mg/dL — ABNORMAL HIGH (ref 70–99)
Potassium: 4.2 mEq/L (ref 3.5–5.1)
Sodium: 137 mEq/L (ref 135–145)

## 2014-09-28 LAB — CBC WITH DIFFERENTIAL/PLATELET
BASOS PCT: 1 % (ref 0.0–3.0)
Basophils Absolute: 0.1 10*3/uL (ref 0.0–0.1)
EOS PCT: 2.8 % (ref 0.0–5.0)
Eosinophils Absolute: 0.4 10*3/uL (ref 0.0–0.7)
HCT: 39.2 % (ref 39.0–52.0)
HEMOGLOBIN: 12.7 g/dL — AB (ref 13.0–17.0)
LYMPHS ABS: 2.7 10*3/uL (ref 0.7–4.0)
Lymphocytes Relative: 20.1 % (ref 12.0–46.0)
MCHC: 32.5 g/dL (ref 30.0–36.0)
MCV: 91.4 fl (ref 78.0–100.0)
MONO ABS: 0.9 10*3/uL (ref 0.1–1.0)
Monocytes Relative: 6.7 % (ref 3.0–12.0)
NEUTROS ABS: 9.5 10*3/uL — AB (ref 1.4–7.7)
Neutrophils Relative %: 69.4 % (ref 43.0–77.0)
Platelets: 351 10*3/uL (ref 150.0–400.0)
RBC: 4.29 Mil/uL (ref 4.22–5.81)
RDW: 14 % (ref 11.5–15.5)
WBC: 13.7 10*3/uL — AB (ref 4.0–10.5)

## 2014-09-28 NOTE — Progress Notes (Signed)
Subjective:    Patient ID: [name], male    DOB: [birth year], 64 y.o.   MRN: [id number]  HPI  HPI     PC Galen Daft, MD Rheum: Dr Bo Merino  HPI  IOV 12/03/2013    Chief Complaint  Patient presents with  . Pulmonary Consult    Referred by Dr. Estanislado Pandy for ILD.    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 He is an extremely poor historian. Information is gathered from him, outside records, [facility] interstitial lung disease clinic 2013 notes  As best as I can tell patient has rheumatoid arthritis since approximately 2010. He is being treated with Celebrex and Humira for possibly the same amount of time. His symptoms are largely limited to the joints. Apparently these medications to control his joint pain. 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 He was followed at [facility] interstitial lung disease clinic by Dr. Wynn Maudlin. Last visit was recorded in the spring of 2013 ital capacity was 58% and diffusion capacity was 40% consistent with moderate interstitial lung disease. Patient himself reports moderate of cough and moderate amount of dyspnea. He says that both these symptoms only impacting his quality of life to a mild extent and is able to live well despite these symptoms. In the office he desaturated immediately after standing up but despite this he feels that his symptoms are impacting his quality of life a little mild extent.  He would not tell me why he switched from [facility] but it appears that probably because he lives alone  Review of this imaging shows that he's had pulmonary fibrosis at least as of 2005 which means it predates the diagnoses of rheumatoid arthritis which is typically the case in many men  CT scan of the chest 06/28/2010: In my opinion has classic UIP features with  significant honeycombing. This is very similar to a CT scan of the chest in September 2009 and February 2005 but likely worse compared to 2005  In terms of his rheumatoid arthritis according to outside notes his positive rheumatoid factor and positive CCP. He does not have any synovitis on exam as of July 2015.   Other issues    - . reports that he quit smoking about 5 years ago. He has never used smokeless tobacco.  - [facility] records suggest that he had right middle lobe nodule that had moderate activity on PET scan in 2013. These have not been followed up since then. Patient completely denies knowledge of these  REC #Pulmonary FIbrosis - UIP due to Rheumatoid ARthritis  - this is due to Rheumatoid Arthritis  - start portable oxygen system 2L [place] with walking; will help you - do HRCT wo contrast - do office spirometry at followup   #Lung nodules seen on CT in 2013 at [place]  - do HRCT wo contrast  #Followup 1 month - after completing above Office Spirometry (not [place]) at time of followup   OV 12/31/2013  Chief Complaint  Patient presents with  . Follow-up    Pt states his breathing has improved since last visit. Pt states his SOB has improved. Pt c/o intermittent dry cough. Pt denies CP/tightness.    HEre to review results. Poor hx with RA and UIP. COncern is that UIP is progresslive.  HE feels fine. AGain, talks very little. Spirometry compared  to 2013 shows progressive disease; FVC now is 51% but was 58% in 2013. CT chest sept 2015 also shows progressive disease compared to 2012. HE says he does not feel the progression. HE is open to listening to my recommendations. He is willing to try cellcept to slow progression of disease    SPirro 12/31/2013  - fvc  2.3L/51%, fev1 1.99L/57%, ratio 87   CT chest 12/10/13: COMPARISON: Chest CT 06/28/2010 IMPRESSION:  1. The appearance of the chest is most compatible with progressively  worsening usual interstitial pneumonia (UIP),  as discussed above.   2. Multiple borderline enlarged and mildly enlarged mediastinal and  bilateral hilar lymph nodes are presumably benign and reactive in  the setting of chronic interstitial lung disease.   3. Atherosclerosis, including left circumflex coronary artery  disease. Please note that although the presence of coronary artery  calcium documents the presence of coronary artery disease, the  severity of this disease and any potential stenosis cannot be  assessed on this non-gated CT examination. Assessment for potential  risk factor modification, dietary therapy or pharmacologic therapy  may be warranted, if clinically indicated.  Electronically Signed  By: Vinnie Langton M.D.  On: 12/10/2013 15:26    REC Start cellcept  01/29/2014 Follow up (Rheumatoid arthritis related UIP type of pulmonary fibrosis.) Returns for follow up .  Started on Cellcept last ov, has been on for last 1-2 weeks, took a while to  [employer].  Was seen 2 days ago by PCP dx w/ bronchitis , tx w/ Levaquin  CXR w/ no acute changes.  Says cough and congestion are getting better Denies chest pain, orthopnea, edema or fever.      OV 03/29/2014  Chief Complaint  Patient presents with  . Follow-up    Pt stated he has more energy since last OV. Pt c/o DOE, mild dry cough. Pt denies CP/tightness.     Follow-up rheumatoid arthritis interstitial lung disease UIP pattern  I started CellCept early October 2015. After this he followed up with my nurse practitioner in November 2015. Between those 2 visits he did have some bronchitis and was treated with Levaquin. He says that he is feeling better in terms of his dyspnea and cough. He still continues to smoke. His last lab work was November 2015 and normal. In December 2015 I did speak with his rheumatologist who has determined that his rheumatoid arthritis is largely confined to the lungs and over time she will slowly stop his Humira. Because he  is already on too strong immunomodulators of Humira and CellCept we have held off on starting prednisone at this stage. He is due for lab work right now. He has not had pulmonary function test. He uses his oxygen nasal cannula. He is interested in OXYGO system, his current CellCept dose is '500mg'$  twice dail; not been increased yet   OV 06/29/2014  Chief Complaint  Patient presents with  . Follow-up    Pt states breathing is still the same. c/o cough with small amount of mucus, white in color.  Denies any wheezing, chest tightness/congestion.     Follow-up rheumatoid arthritis interstitial lung disease UIP pattern without evidence of systemic RA. I started CellCept early October 2015; '500mg'$  bid + bactrim (not on prednisone because was being weaned off humira). Overall stable. Has baseline cough, and mucus. He smells of tobacco - entire room does but he denies he is actively smoking. Says smell is from friends he hangs out who smoke. 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 OFf humira I suppose. Not yet on prednisone. He is open to increasing dose of cellcept  OV 09/28/2014  Chief Complaint  Patient presents with  . Follow-up    Pt stated his chest congestion has improved and now has more energy. Pt c/o mild cough with intermittent mucus production. Pt denies CP/tightness.     Follow-up rheumatoid arthritis interstitial lung disease UIP pattern without systemic evidence of rheumatoid arthritis. Started on mycophenolate 500 mg twice daily. Not on prednisone or TNF alpha blockade. Last visit 06/29/2014 we increased his mycophenolate 1000 mg twice daily. He is also compliant with that and with the Bactrim PCP prophylaxis. He continues to deny that he smokes but he smells of tobacco. He actually is feeling better in terms of his cough and dyspnea and energy levels and attributes this to increase  in  mycophenolate    reports that he quit smoking about 6 years ago. He has never used smokeless tobacco.    Current outpatient prescriptions:  .  albuterol (VENTOLIN HFA) 108 (90 BASE) MCG/ACT inhaler, Inhale 2 puffs into the lungs every 4 (four) hours as needed for wheezing or shortness of breath., Disp: 8.5 each, Rfl: 11 .  amitriptyline (ELAVIL) 25 MG tablet, Take 1 tablet (25 mg total) by mouth at bedtime. Increase to 2 tabs/night after 2 weeks, Disp: 60 tablet, Rfl: 5 .  aspirin 81 MG tablet, Take 81 mg by mouth daily., Disp: , Rfl:  .  gabapentin (NEURONTIN) 400 MG capsule, TAKE 2 CAPSULES (800 MG TOTAL) BY MOUTH 3 (THREE) TIMES DAILY., Disp: 180 capsule, Rfl: 1 .  glucose blood (ONE TOUCH ULTRA TEST) test strip, Test blood sugar daily. Dx code: E11.65, Disp: 100 each, Rfl: 2 .  ipratropium-albuterol (DUONEB) 0.5-2.5 (3) MG/3ML SOLN, Take 3 mLs by nebulization every 4 (four) hours as needed., Disp: 360 mL, Rfl: 21 .  losartan (COZAAR) 50 MG tablet, TAKE 1 TABLET BY MOUTH ONCE DAILY FOR BLOOD PRESSURE, Disp: 90 tablet, Rfl: 1 .  metFORMIN (GLUCOPHAGE) 1000 MG tablet, Take 1 tablet (1,000 mg total) by mouth 2 (two) times daily with a meal. FOR DIABETES, Disp: 180 tablet, Rfl: 1 .  mycophenolate (CELLCEPT) 500 MG tablet, TAKE 2 TABLETS TWICE DAILY 1 HOUR BEFORE OR 2 HOURS  AFTER MEALS, Disp: 120 tablet, Rfl: 6 .  pravastatin (PRAVACHOL) 40 MG tablet, Take 1 tablet (40 mg total) by mouth at bedtime. FOR CHOLESTEROL, Disp: 90 tablet, Rfl: 1 .  sulfamethoxazole-trimethoprim (BACTRIM,SEPTRA) 400-80 MG per tablet, Take 1 tablet by mouth 3 (three) times a week., Disp: , Rfl:     Review of Systems  Constitutional: Negative for fever and unexpected weight change.  HENT: Negative for congestion, dental problem, ear pain, nosebleeds, postnasal drip, rhinorrhea, sinus pressure, sneezing, sore throat and trouble swallowing.   Eyes: Negative for redness and itching.  Respiratory: Positive for cough  and shortness of breath. Negative for chest tightness and wheezing.   Cardiovascular: Negative for palpitations and leg swelling.  Gastrointestinal: Negative for nausea and vomiting.  Genitourinary: Negative for dysuria.  Musculoskeletal: Negative for joint swelling.  Skin: Negative for rash.  Neurological: Negative for headaches.  Hematological: Does not bruise/bleed easily.  Psychiatric/Behavioral: Negative for dysphoric mood. The patient is not nervous/anxious.        Objective:   Physical Exam  Constitutional: He is oriented to  person, place, and time. He appears well-developed and well-nourished. No distress.  Smells of tobacco but denies as always  HENT:  Head: Normocephalic and atraumatic.  Right Ear: External ear normal.  Left Ear: External ear normal.  Mouth/Throat: Oropharynx is clear and moist. No oropharyngeal exudate.  o2 on  Eyes: Conjunctivae and EOM are normal. Pupils are equal, round, and reactive to light. Right eye exhibits no discharge. Left eye exhibits no discharge. No scleral icterus.  Neck: Normal range of motion. Neck supple. No JVD present. No tracheal deviation present. No thyromegaly present.  Cardiovascular: Normal rate, regular rhythm and intact distal pulses.  Exam reveals no gallop and no friction rub.   No murmur heard. Pulmonary/Chest: Effort normal. No respiratory distress. He has no wheezes. He has rales. He exhibits no tenderness.  Bilateral crackles lower lobe and anteriorly half way up  Abdominal: Soft. Bowel sounds are normal. He exhibits no distension and no mass. There is no tenderness. There is no rebound and no guarding.  Visceral obesoity +  Musculoskeletal: Normal range of motion. He exhibits no edema or tenderness.  Lymphadenopathy:    He has no cervical adenopathy.  Neurological: He is alert and oriented to person, place, and time. He has normal reflexes. No cranial nerve deficit. Coordination normal.  Skin: Skin is warm and dry. No  rash noted. He is not diaphoretic. No erythema. No pallor.  Psychiatric: He has a normal mood and affect. His behavior is normal. Judgment and thought content normal.  Nursing note and vitals reviewed.   Filed Vitals:   09/28/14 1336  BP: 118/70  Pulse: 120  Height: '5\' 9"'$  (1.753 m)  Weight: 200 lb 6.4 oz (90.901 kg)  SpO2: 92%        Assessment & Plan:   1. 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 Encounter for therapeutic drug monitoring of high risk medication  Glad you are feeling stable and improved Do blood work CBC, BMEt, LFT - will call with results Continue cellcept  '1000mg'$  twice daily Continue Bactrim 1 tab Monday, wed Friday Use oxugen OXYGO system   Followup PFT in 3 months and High REsolution CT chest in 3 months to track disease progress REturn to see me in 3 months afer above    Dr. Brand Males, M.D., [facility].C.P Pulmonary and Critical Care Medicine Staff Physician [facility] Pager: [phone], If no answer or between  15:00h - 7:00h: call [phone]  09/28/2014 1:58 PM

## 2014-09-28 NOTE — Patient Instructions (Addendum)
1. 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 Encounter for therapeutic drug monitoring of high risk medication    Glad you are feeling stable and improved Do blood work CBC, BMEt, LFT - will call with results Continue cellcept  '1000mg'$  twice daily Continue Bactrim 1 tab Monday, wed Friday Use oxugen OXYGO system   Followup PFT in 3 months and High REsolution CT chest in 3 months to track disease progress REturn to see me in 3 months afer above

## 2014-10-01 DIAGNOSIS — Z79899 Other long term (current) drug therapy: Secondary | ICD-10-CM | POA: Insufficient documentation

## 2014-10-04 NOTE — Progress Notes (Signed)
Quick Note:  lmtcb for pt. ______ 

## 2014-10-06 ENCOUNTER — Telehealth: Payer: Self-pay | Admitting: Internal Medicine

## 2014-10-06 NOTE — Progress Notes (Signed)
Quick Note:  LVM for pt to return call ______ 

## 2014-10-06 NOTE — Telephone Encounter (Signed)
Result Note     Cbc., bmet, lft are within baseline range  --  I spoke with patient about results and he verbalized understanding and had no questions

## 2014-11-19 DIAGNOSIS — M069 Rheumatoid arthritis, unspecified: Secondary | ICD-10-CM

## 2014-12-05 ENCOUNTER — Other Ambulatory Visit: Payer: Self-pay | Admitting: Family Medicine

## 2014-12-29 ENCOUNTER — Ambulatory Visit (INDEPENDENT_AMBULATORY_CARE_PROVIDER_SITE_OTHER)
Admission: RE | Admit: 2014-12-29 | Discharge: 2014-12-29 | Disposition: A | Discharge status: Home / Self Care | Payer: Medicare Other | Source: Ambulatory Visit | Attending: Internal Medicine | Admitting: Internal Medicine

## 2014-12-29 DIAGNOSIS — M069 Rheumatoid arthritis, unspecified: Secondary | ICD-10-CM

## 2014-12-29 DIAGNOSIS — J84112 Idiopathic pulmonary fibrosis: Secondary | ICD-10-CM

## 2014-12-29 DIAGNOSIS — Z5181 Encounter for therapeutic drug level monitoring: Secondary | ICD-10-CM | POA: Diagnosis not present

## 2014-12-31 ENCOUNTER — Telehealth: Payer: Self-pay | Admitting: Internal Medicine

## 2014-12-31 NOTE — Telephone Encounter (Signed)
Result Notes     Notes Recorded by Glean Hess, CMA on 12/31/2014 at 3:50 PM lmtcb. ------  Notes Recorded by Brand Males, MD on 12/31/2014 at 5:00 AM I am seeing him 01/26/15 - let him know to keep appt because CT has progressed compared to past   lmtcb x2 for pt

## 2015-01-03 NOTE — Telephone Encounter (Signed)
lmomtcb x 2  

## 2015-01-04 NOTE — Telephone Encounter (Signed)
lmtcb

## 2015-01-04 NOTE — Telephone Encounter (Signed)
Pt returned call [phone]  Please leave a detailed message per pt

## 2015-01-04 NOTE — Telephone Encounter (Signed)
Left detailed message on vm at pt's request.  Nothing further needed.

## 2015-01-11 ENCOUNTER — Other Ambulatory Visit: Payer: Self-pay | Admitting: Internal Medicine

## 2015-01-13 ENCOUNTER — Other Ambulatory Visit: Payer: Self-pay | Admitting: Family Medicine

## 2015-01-20 ENCOUNTER — Encounter: Payer: Self-pay | Admitting: Family Medicine

## 2015-01-20 ENCOUNTER — Ambulatory Visit (INDEPENDENT_AMBULATORY_CARE_PROVIDER_SITE_OTHER): Payer: Medicare Other | Admitting: Family Medicine

## 2015-01-20 VITALS — BP 127/82 | HR 116 | Temp 97.6°F | Resp 18 | Wt 195.6 lb

## 2015-01-20 DIAGNOSIS — E785 Hyperlipidemia, unspecified: Secondary | ICD-10-CM | POA: Diagnosis not present

## 2015-01-20 DIAGNOSIS — J8410 Pulmonary fibrosis, unspecified: Secondary | ICD-10-CM

## 2015-01-20 DIAGNOSIS — Z113 Encounter for screening for infections with a predominantly sexual mode of transmission: Secondary | ICD-10-CM | POA: Diagnosis not present

## 2015-01-20 DIAGNOSIS — J84112 Idiopathic pulmonary fibrosis: Secondary | ICD-10-CM | POA: Diagnosis not present

## 2015-01-20 DIAGNOSIS — M069 Rheumatoid arthritis, unspecified: Secondary | ICD-10-CM | POA: Diagnosis not present

## 2015-01-20 DIAGNOSIS — I10 Essential (primary) hypertension: Secondary | ICD-10-CM | POA: Diagnosis not present

## 2015-01-20 DIAGNOSIS — Z5181 Encounter for therapeutic drug level monitoring: Secondary | ICD-10-CM | POA: Diagnosis not present

## 2015-01-20 DIAGNOSIS — E1142 Type 2 diabetes mellitus with diabetic polyneuropathy: Secondary | ICD-10-CM

## 2015-01-20 DIAGNOSIS — D849 Immunodeficiency, unspecified: Secondary | ICD-10-CM

## 2015-01-20 DIAGNOSIS — D899 Disorder involving the immune mechanism, unspecified: Secondary | ICD-10-CM | POA: Diagnosis not present

## 2015-01-20 DIAGNOSIS — Z79899 Other long term (current) drug therapy: Secondary | ICD-10-CM | POA: Diagnosis not present

## 2015-01-20 DIAGNOSIS — Z23 Encounter for immunization: Secondary | ICD-10-CM | POA: Diagnosis not present

## 2015-01-20 LAB — POCT GLYCOSYLATED HEMOGLOBIN (HGB A1C): Hemoglobin A1C: 5.7

## 2015-01-20 LAB — HIV ANTIBODY (ROUTINE TESTING W REFLEX): HIV 1&2 Ab, 4th Generation: NONREACTIVE

## 2015-01-20 LAB — LIPID PANEL
CHOL/HDL RATIO: 3.9 ratio (ref <=5.0)
CHOLESTEROL: 129 mg/dL (ref 125–200)
HDL: 33 mg/dL — AB (ref >=40)
LDL Cholesterol: 71 mg/dL (ref <130)
TRIGLYCERIDES: 123 mg/dL (ref <150)
VLDL: 25 mg/dL (ref <30)

## 2015-01-20 LAB — COMPREHENSIVE METABOLIC PANEL
ALK PHOS: 102 U/L (ref 40–115)
ALT: 13 U/L (ref 9–46)
AST: 14 U/L (ref 10–35)
Albumin: 4 g/dL (ref 3.6–5.1)
BUN: 23 mg/dL (ref 7–25)
CALCIUM: 8.9 mg/dL (ref 8.6–10.3)
CO2: 22 mmol/L (ref 20–31)
Chloride: 103 mmol/L (ref 98–110)
Creat: 1.13 mg/dL (ref 0.70–1.25)
GLUCOSE: 95 mg/dL (ref 65–99)
POTASSIUM: 4.9 mmol/L (ref 3.5–5.3)
Sodium: 136 mmol/L (ref 135–146)
Total Bilirubin: 0.4 mg/dL (ref 0.2–1.2)
Total Protein: 7.4 g/dL (ref 6.1–8.1)

## 2015-01-20 LAB — CBC WITH DIFFERENTIAL/PLATELET
BASOS ABS: 0.2 10*3/uL — AB (ref 0.0–0.1)
BASOS PCT: 1 % (ref 0–1)
Eosinophils Absolute: 0.5 10*3/uL (ref 0.0–0.7)
Eosinophils Relative: 3 % (ref 0–5)
HCT: 41.5 % (ref 39.0–52.0)
HEMOGLOBIN: 13.8 g/dL (ref 13.0–17.0)
Lymphocytes Relative: 15 % (ref 12–46)
Lymphs Abs: 2.3 10*3/uL (ref 0.7–4.0)
MCH: 30.1 pg (ref 26.0–34.0)
MCHC: 33.3 g/dL (ref 30.0–36.0)
MCV: 90.4 fL (ref 78.0–100.0)
MONOS PCT: 9 % (ref 3–12)
MPV: 9.4 fL (ref 8.6–12.4)
Monocytes Absolute: 1.4 10*3/uL — ABNORMAL HIGH (ref 0.1–1.0)
NEUTROS ABS: 11 10*3/uL — AB (ref 1.7–7.7)
NEUTROS PCT: 72 % (ref 43–77)
Platelets: 397 10*3/uL (ref 150–400)
RBC: 4.59 MIL/uL (ref 4.22–5.81)
RDW: 13.3 % (ref 11.5–15.5)
WBC: 15.3 10*3/uL — ABNORMAL HIGH (ref 4.0–10.5)

## 2015-01-20 LAB — HEMOGLOBIN A1C: HEMOGLOBIN A1C: 5.7 % (ref 4.0–6.0)

## 2015-01-20 LAB — HEPATITIS C ANTIBODY: HCV AB: NEGATIVE

## 2015-01-20 MED ORDER — MELOXICAM 7.5 MG PO TABS: 7.5000 mg | ORAL_TABLET | Freq: Every day | ORAL | Status: DC | PRN

## 2015-01-20 MED ORDER — METFORMIN HCL 500 MG PO TABS: 500.0000 mg | ORAL_TABLET | Freq: Two times a day (BID) | ORAL | Status: DC

## 2015-01-20 MED ORDER — METFORMIN HCL 1000 MG PO TABS: 500.0000 mg | ORAL_TABLET | Freq: Two times a day (BID) | ORAL | Status: DC

## 2015-01-20 MED ORDER — GABAPENTIN 400 MG PO CAPS: 800.0000 mg | ORAL_CAPSULE | Freq: Three times a day (TID) | ORAL | Status: DC

## 2015-01-20 NOTE — Patient Instructions (Addendum)
Cut the metformin in half and take 1/2 with breakfast and 1/2 with supper until you run out of your current metformin.  I sent in a new prescription for metformin to your pharmacy at the lower dose so of the new one go back to 1 twice a day. Hopefully you will continue to do as well as you have and we want to maybe get off your supper dose after your next visit.  Do not use any other otc pain medication other than tylenol/acetaminophen - so no aleve, ibuprofen, motrin, advil, etc - when you are taking the meloxicam - although do consider adding on an extra-strength tylenol arthritis if you need additional pain relief with the meloxciam.

## 2015-01-20 NOTE — Progress Notes (Signed)
Subjective:    Patient ID: [name], male    DOB: 06-04-1950, 64 y.o.   MRN: [id number]   Chief Complaint  Patient presents with  . Diabetes    follow up  . Hypertension    follow up    HPI  HPI Comments: [name] is a 64 y.o. male who presents to the [facility] for a follow-up.  He had DM diagnosed about 2 yrs ago ago. He has done very well with his sugars and decreasing his medication doses.  His DM had dramatically worsened on prednisone, but is now off. His last HbA1c has been steady at 6.1 over the past yr. His last urine micro albumin 8 months ago was mildly elevated at 8.8. He is on losartan. Lipid panel was done August, 2015 and was at goal.   He does have a pneumonitis, for which he sees Dr. Chase Caller and Dr. Estanislado Pandy for interstitial lung disease with fibrosis. He also has rheumatoid arthritis. He was recently weaned off Humira and placed on CellCept.    His CBG have been around 100-125 two hours after eating, but they are sometimes in the 90s. He has not had any problems with his metformin. He denies any hypoglycemic symptoms. Patient takes aspirin daily. He has done more walking for exercising. He sees Dr. Sherral Hammers for eye care and was last seen earlier this yr when he was told he had cataracts in his right eye. Patient denies any visual changes. He does not take fish oil, OTC medications or vitamins. Patient denies nocturia, myalgias and bowel issues.   Patient notes he still has some bearable pain from the last outbreak of shingles. He describes the pain as similar to a sunburn. He is on neurontin for neuropathy and we started low doe qhs elavil at his last visit as well.  Not needing duoneb very often. Been of prednisone a long time. Elav il helped him sleep and got him back into his reg cycle so no longer taking but sleeping better. Shingles pain still present but gradually improves.  Likes motorcycles - has 2   Past Medical History   Diagnosis Date  . Rheumatoid arthritis(714.0) 2005  . Pulmonary fibrosis   . Hypertension   . GERD (gastroesophageal reflux disease)   . Diabetes mellitus without complication   . Dyspnea    Current Outpatient Prescriptions on File Prior to Visit  Medication Sig Dispense Refill  . albuterol (VENTOLIN HFA) 108 (90 BASE) MCG/ACT inhaler Inhale 2 puffs into the lungs every 4 (four) hours as needed for wheezing or shortness of breath. 8.5 each 11  . amitriptyline (ELAVIL) 25 MG tablet Take 1 tablet (25 mg total) by mouth at bedtime. Increase to 2 tabs/night after 2 weeks 60 tablet 5  . aspirin 81 MG tablet Take 81 mg by mouth daily.    Marland Kitchen gabapentin (NEURONTIN) 400 MG capsule TAKE 2 CAPSULES (800 MG TOTAL) BY MOUTH 3 (THREE) TIMES DAILY  "OFFICE VISIT NEEDED FOR REFILLS" 180 capsule 0  . glucose blood (ONE TOUCH ULTRA TEST) test strip Test blood sugar daily. Dx code: E11.65 100 each 2  . ipratropium-albuterol (DUONEB) 0.5-2.5 (3) MG/3ML SOLN Take 3 mLs by nebulization every 4 (four) hours as needed. 360 mL 21  . losartan (COZAAR) 50 MG tablet TAKE 1 TABLET BY MOUTH ONCE DAILY FOR BLOOD PRESSURE 90 tablet 1  . metFORMIN (GLUCOPHAGE) 1000 MG tablet Take 1 tablet (1,000 mg total) by mouth 2 (two)  times daily with a meal. FOR DIABETES 180 tablet 1  . mycophenolate (CELLCEPT) 500 MG tablet TAKE 2 TABLETS TWICE DAILY 1 HOUR BEFORE OR 2 HOURS  AFTER MEALS 120 tablet 6  . pravastatin (PRAVACHOL) 40 MG tablet Take 1 tablet (40 mg total) by mouth at bedtime. FOR CHOLESTEROL 90 tablet 1  . sulfamethoxazole-trimethoprim (BACTRIM DS,SEPTRA DS) 800-160 MG tablet TAKE 1 TABLET BY MOUTH 3 (THREE) TIMES A WEEK. 12 tablet 5  . sulfamethoxazole-trimethoprim (BACTRIM,SEPTRA) 400-80 MG per tablet Take 1 tablet by mouth 3 (three) times a week.     No current facility-administered medications on file prior to visit.   No Known Allergies   Review of Systems  Constitutional: Positive for fatigue. Negative for  activity change, appetite change and unexpected weight change.  Respiratory: Positive for cough, chest tightness, shortness of breath and wheezing.   Musculoskeletal: Positive for back pain and arthralgias. Negative for gait problem.  Neurological: Negative for weakness and light-headedness.  Hematological: Positive for adenopathy.  Psychiatric/Behavioral: Negative for sleep disturbance.       Objective:  BP 127/82 mmHg  Pulse 116  Temp(Src) 97.6 F (36.4 C) (Oral)  Resp 18  Wt 195 lb 9.6 oz (88.724 kg)   Physical Exam  Constitutional: He is oriented to person, place, and time. He appears well-developed and well-nourished. No distress.  HENT:  Head: Normocephalic and atraumatic.  Mouth/Throat: Oropharynx is clear and moist. No oropharyngeal exudate.  Eyes: Pupils are equal, round, and reactive to light.  Neck: Neck supple.  Cardiovascular: Regular rhythm, S1 normal, S2 normal and normal heart sounds.  Tachycardia present.   No murmur heard. Pulmonary/Chest: Effort normal. He has rales.  Musculoskeletal: He exhibits no edema.  Neurological: He is alert and oriented to person, place, and time. No cranial nerve deficit.  Skin: Skin is warm and dry. No rash noted.  Psychiatric: He has a normal mood and affect. His behavior is normal.  Vitals reviewed.  Results for orders placed or performed in visit on 01/20/15  POCT glycosylated hemoglobin (Hb A1C)  Result Value Ref Range   Hemoglobin A1C 5.7     Assessment & Plan:  Send labs to Dr. Estanislado Pandy. Wants to try celebrex which has worked well for him in past, has never tried meloxicam  1. Essential hypertension, benign   2. PULMONARY FIBROSIS ILD POST INFLAMMATORY CHRONIC   3. UIP (usual interstitial pneumonitis) ([place])   4. Type 2 diabetes mellitus with diabetic polyneuropathy, without long-term current use of insulin ([place])   5. Rheumatoid arthritis involving multiple sites, unspecified rheumatoid factor presence ([place])   6.  Hyperlipidemia   7. Encounter for therapeutic drug monitoring   8. Immunosuppressed status ([place])   9. Polypharmacy   10. Encounter for long-term (current) use of high-risk medication   11. Routine screening for STI (sexually transmitted infection)   56. Need for prophylactic vaccination and inoculation against influenza     Orders Placed This Encounter  Procedures  . Flu Vaccine QUAD 36+ mos IM  . Comprehensive metabolic panel  . CBC with Differential/Platelet  . Lipid panel    Order Specific Question:  Has the patient fasted?    Answer:  Yes  . HIV antibody  . Hepatitis C Ab Reflex HCV RNA, QUANT  . Hepatitis C antibody  . Hemoglobin A1c    This external order was created through the Results Console.  Marland Kitchen POCT glycosylated hemoglobin (Hb A1C)    Meds ordered this encounter  Medications  .  DISCONTD: metFORMIN (GLUCOPHAGE) 1000 MG tablet    Sig: Take 0.5 tablets (500 mg total) by mouth 2 (two) times daily with a meal. FOR DIABETES    Dispense:  180 tablet    Refill:  1  . gabapentin (NEURONTIN) 400 MG capsule    Sig: Take 2 capsules (800 mg total) by mouth 3 (three) times daily.    Dispense:  180 capsule    Refill:  5  . metFORMIN (GLUCOPHAGE) 500 MG tablet    Sig: Take 1 tablet (500 mg total) by mouth 2 (two) times daily with a meal. FOR DIABETES    Dispense:  180 tablet    Refill:  1  . meloxicam (MOBIC) 7.5 MG tablet    Sig: Take 1 tablet (7.5 mg total) by mouth daily as needed for pain. Do not use with any other otc pain medication other than acetaminophen    Dispense:  30 tablet    Refill:  1     Had CPE with me 04/2014 - sched next AWV in 4 mos - cons  Repeating tsh then, needs one time Hep C and HIV testing  SHINGLES VCCINE?  [name], MD MPH

## 2015-01-21 ENCOUNTER — Ambulatory Visit: Payer: Medicare Other | Admitting: Family Medicine

## 2015-01-25 ENCOUNTER — Other Ambulatory Visit: Payer: Self-pay | Admitting: Internal Medicine

## 2015-01-25 DIAGNOSIS — R0600 Dyspnea, unspecified: Secondary | ICD-10-CM

## 2015-01-26 ENCOUNTER — Ambulatory Visit (INDEPENDENT_AMBULATORY_CARE_PROVIDER_SITE_OTHER): Payer: Medicare Other | Admitting: Internal Medicine

## 2015-01-26 ENCOUNTER — Encounter: Payer: Self-pay | Admitting: Internal Medicine

## 2015-01-26 VITALS — BP 114/70 | HR 99 | Ht 69.0 in | Wt 200.0 lb

## 2015-01-26 DIAGNOSIS — J84112 Idiopathic pulmonary fibrosis: Secondary | ICD-10-CM | POA: Diagnosis not present

## 2015-01-26 DIAGNOSIS — R0600 Dyspnea, unspecified: Secondary | ICD-10-CM | POA: Diagnosis not present

## 2015-01-26 LAB — PULMONARY FUNCTION TEST
DL/VA % PRED: 67 %
DL/VA: 3.05 ml/min/mmHg/L
DLCO UNC % PRED: 37 %
DLCO unc: 11.58 ml/min/mmHg
FEF 25-75 PRE: 4.27 L/s
FEF 25-75 Post: 3.43 L/sec
FEF2575-%Change-Post: -19 %
FEF2575-%PRED-POST: 132 %
FEF2575-%Pred-Pre: 165 %
FEV1-%Change-Post: -4 %
FEV1-%Pred-Post: 68 %
FEV1-%Pred-Pre: 71 %
FEV1-POST: 2.25 L
FEV1-Pre: 2.36 L
FEV1FVC-%Change-Post: 0 %
FEV1FVC-%PRED-PRE: 121 %
FEV6-%CHANGE-POST: -4 %
FEV6-%PRED-POST: 60 %
FEV6-%Pred-Pre: 62 %
FEV6-POST: 2.51 L
FEV6-PRE: 2.62 L
FEV6FVC-%CHANGE-POST: 0 %
FEV6FVC-%PRED-POST: 104 %
FEV6FVC-%PRED-PRE: 104 %
FVC-%CHANGE-POST: -4 %
FVC-%PRED-POST: 57 %
FVC-%Pred-Pre: 59 %
FVC-Post: 2.52 L
FVC-Pre: 2.62 L
POST FEV1/FVC RATIO: 90 %
Post FEV6/FVC ratio: 100 %
Pre FEV1/FVC ratio: 90 %
Pre FEV6/FVC Ratio: 100 %

## 2015-01-26 NOTE — Patient Instructions (Signed)
ICD-9-CM ICD-10-CM   1. UIP (usual interstitial pneumonitis) ([place]) 515 J84.71     Glad you are feeling stable and improved Continue cellcept  '1000mg'$  twice daily Will get blood work from [place], MD - please sign release Continue Bactrim 1 tab Monday, wed Friday Use oxugen OXYGO system  Glad you are uptodate with flu shot   Followup PFT in 6 months  REturn to see me in 6 months afer above

## 2015-01-26 NOTE — Progress Notes (Signed)
PFT done today. 

## 2015-01-26 NOTE — Progress Notes (Signed)
Subjective:    Patient ID: [name], male    DOB: 01/30/1951, 64 y.o.   MRN: [id number]  HPI  HPI     PC Galen Daft, MD Rheum: Dr Bo Merino  HPI  IOV 12/03/2013    Chief Complaint  Patient presents with  . Pulmonary Consult    Referred by Dr. Estanislado Pandy for ILD.    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 He is an extremely poor historian. Information is gathered from him, outside records, [facility] interstitial lung disease clinic 2013 notes  As best as I can tell patient has rheumatoid arthritis since approximately 2010. He is being treated with Celebrex and Humira for possibly the same amount of time. His symptoms are largely limited to the joints. Apparently these medications to control his joint pain. 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 He was followed at [facility] lung [facility] clinic by Dr. Wynn Maudlin. Last visit was recorded in the spring of 2013 ital capacity was 58% and diffusion capacity was 40% consistent with moderate interstitial lung disease. Patient himself reports moderate of cough and moderate amount of dyspnea. He says that both these symptoms only impacting his quality of life to a mild extent and is able to live well despite these symptoms. In the office he desaturated immediately after standing up but despite this he feels that his symptoms are impacting his quality of life a little mild extent.  He would not tell me why he switched from [facility] but it appears that probably because he lives alone  Review of this imaging shows that he's had pulmonary fibrosis at least as of 2005 which means it predates the diagnoses of rheumatoid arthritis which is typically the case in many men  CT scan of the chest 06/28/2010: In my opinion has classic UIP features with  significant honeycombing. This is very similar to a CT scan of the chest in September 2009 and February 2005 but likely worse compared to 2005  In terms of his rheumatoid arthritis according to outside notes his positive rheumatoid factor and positive CCP. He does not have any synovitis on exam as of July 2015.   Other issues    - . reports that he quit smoking about 5 years ago. He has never used smokeless tobacco.  - [facility] records suggest that he had right middle lobe nodule that had moderate activity on PET scan in 2013. These have not been followed up since then. Patient completely denies knowledge of these  REC #Pulmonary FIbrosis - UIP due to Rheumatoid ARthritis  - this is due to Rheumatoid Arthritis  - start portable oxygen system 2L [place] with walking; will help you - do HRCT wo contrast - do office spirometry at followup   #Lung nodules seen on CT in 2013 at [place]  - do HRCT wo contrast  #Followup 1 month - after completing above Office Spirometry (not [place]) at time of followup   OV 12/31/2013  Chief Complaint  Patient presents with  . Follow-up    Pt states his breathing has improved since last visit. Pt states his SOB has improved. Pt c/o intermittent dry cough. Pt denies CP/tightness.    HEre to review results. Poor hx with RA and UIP. COncern is that UIP is progresslive.  HE feels fine. AGain, talks very little. Spirometry compared  to 2013 shows progressive disease; FVC now is 51% but was 58% in 2013. CT chest sept 2015 also shows progressive disease compared to 2012. HE says he does not feel the progression. HE is open to listening to my recommendations. He is willing to try cellcept to slow progression of disease    SPirro 12/31/2013  - fvc  2.3L/51%, fev1 1.99L/57%, ratio 87   CT chest 12/10/13: COMPARISON: Chest CT 06/28/2010 IMPRESSION:  1. The appearance of the chest is most compatible with progressively  worsening usual interstitial pneumonia (UIP),  as discussed above.   2. Multiple borderline enlarged and mildly enlarged mediastinal and  bilateral hilar lymph nodes are presumably benign and reactive in  the setting of chronic interstitial lung disease.   3. Atherosclerosis, including left circumflex coronary artery  disease. Please note that although the presence of coronary artery  calcium documents the presence of coronary artery disease, the  severity of this disease and any potential stenosis cannot be  assessed on this non-gated CT examination. Assessment for potential  risk factor modification, dietary therapy or pharmacologic therapy  may be warranted, if clinically indicated.  Electronically Signed  By: Vinnie Langton M.D.  On: 12/10/2013 15:26    REC Start cellcept  01/29/2014 Follow up (Rheumatoid arthritis related UIP type of pulmonary fibrosis.) Returns for follow up .  Started on Cellcept last ov, has been on for last 1-2 weeks, took a while to  [employer].  Was seen 2 days ago by PCP dx w/ bronchitis , tx w/ Levaquin  CXR w/ no acute changes.  Says cough and congestion are getting better Denies chest pain, orthopnea, edema or fever.      OV 03/29/2014  Chief Complaint  Patient presents with  . Follow-up    Pt stated he has more energy since last OV. Pt c/o DOE, mild dry cough. Pt denies CP/tightness.     Follow-up rheumatoid arthritis interstitial lung disease UIP pattern  I started CellCept early October 2015. After this he followed up with my nurse practitioner in November 2015. Between those 2 visits he did have some bronchitis and was treated with Levaquin. He says that he is feeling better in terms of his dyspnea and cough. He still continues to smoke. His last lab work was November 2015 and normal. In December 2015 I did speak with his rheumatologist who has determined that his rheumatoid arthritis is largely confined to the lungs and over time she will slowly stop his Humira. Because he  is already on too strong immunomodulators of Humira and CellCept we have held off on starting prednisone at this stage. He is due for lab work right now. He has not had pulmonary function test. He uses his oxygen nasal cannula. He is interested in OXYGO system, his current CellCept dose is '500mg'$  twice dail; not been increased yet   OV 06/29/2014  Chief Complaint  Patient presents with  . Follow-up    Pt states breathing is still the same. c/o cough with small amount of mucus, white in color.  Denies any wheezing, chest tightness/congestion.     Follow-up rheumatoid arthritis interstitial lung disease UIP pattern without evidence of systemic RA. I started CellCept early October 2015; '500mg'$  bid + bactrim (not on prednisone because was being weaned off humira). Overall stable. Has baseline cough, and mucus. He smells of tobacco - entire room does but he denies he is actively smoking. Says smell is from friends he hangs out who smoke. 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 OFf humira I suppose. Not yet on prednisone. He is open to increasing dose of cellcept  OV 01/26/2015  Chief Complaint  Patient presents with  . Follow-up    Pt here after HRCT and PFT. Pt states his breathing is unchanged since last OV. Pt c/o cough with intermittent mucus production - white mucus. Pt denies CP/tightness.    Follow-up rheumatoid arthritis interstitial lung disease UIP pattern without evidence of systemic rheumatoid arthritis. On CellCept since October 2015 dose of 1000 mg twice a day since April 2016. Overall stable. Uses oxygen. Has baseline cough and mucus. As always he smells of tobacco but as always he denies using tobacco. He had CT scan of the chest 10/05/20160 that I personally visualized. Shows UIP pattern that has progressed since September 2015. However spirometry at the same time show  stability/slight improvement. His FVC today is 2.6 L/59% compared to 2.3 L/51% in October 2015. Diffusion capacity is 40.98/11% which certainly has declined compared to a few years ago.  For the first time in his life he suddenly started asking me prognosis questions about life expectancy, manner of death of death and if this a lifelong disease.  Current outpatient prescriptions:  .  albuterol (VENTOLIN HFA) 108 (90 BASE) MCG/ACT inhaler, Inhale 2 puffs into the lungs every 4 (four) hours as needed for wheezing or shortness of breath., Disp: 8.5 each, Rfl: 11 .  aspirin 81 MG tablet, Take 81 mg by mouth daily., Disp: , Rfl:  .  gabapentin (NEURONTIN) 400 MG capsule, Take 2 capsules (800 mg total) by mouth 3 (three) times daily., Disp: 180 capsule, Rfl: 5 .  glucose blood (ONE TOUCH ULTRA TEST) test strip, Test blood sugar daily. Dx code: E11.65, Disp: 100 each, Rfl: 2 .  ipratropium-albuterol (DUONEB) 0.5-2.5 (3) MG/3ML SOLN, Take 3 mLs by nebulization every 4 (four) hours as needed., Disp: 360 mL, Rfl: 21 .  losartan (COZAAR) 50 MG tablet, TAKE 1 TABLET BY MOUTH ONCE DAILY FOR BLOOD PRESSURE, Disp: 90 tablet, Rfl: 1 .  meloxicam (MOBIC) 7.5 MG tablet, Take 1 tablet (7.5 mg total) by mouth daily as needed for pain. Do not use with any other otc pain medication other than acetaminophen, Disp: 30 tablet, Rfl: 1 .  metFORMIN (GLUCOPHAGE) 500 MG tablet, Take 1 tablet (500 mg total) by mouth 2 (two) times daily with a meal. FOR DIABETES, Disp: 180 tablet, Rfl: 1 .  mycophenolate (CELLCEPT) 500 MG tablet, TAKE 2 TABLETS TWICE DAILY 1 HOUR BEFORE OR 2 HOURS  AFTER MEALS, Disp: 120 tablet, Rfl: 6 .  pravastatin (PRAVACHOL) 40 MG tablet, Take 1 tablet (40 mg total) by mouth at bedtime. FOR CHOLESTEROL, Disp: 90 tablet, Rfl: 1 .  sulfamethoxazole-trimethoprim (BACTRIM DS,SEPTRA DS) 800-160 MG tablet, TAKE 1 TABLET BY MOUTH 3 (THREE) TIMES A WEEK., Disp: 12 tablet, Rfl: 5    Review of Systems    Constitutional: Negative for fever and unexpected weight change.  HENT: Negative for congestion, dental problem, ear pain, nosebleeds, postnasal drip, rhinorrhea, sinus pressure, sneezing, sore throat and trouble swallowing.   Eyes: Negative for redness and itching.  Respiratory: Positive for cough and shortness of breath. Negative for chest tightness and wheezing.   Cardiovascular: Negative for palpitations and leg swelling.  Gastrointestinal: Negative for nausea and vomiting.  Genitourinary: Negative for dysuria.  Musculoskeletal: Negative for joint  swelling.  Skin: Negative for rash.  Neurological: Negative for headaches.  Hematological: Does not bruise/bleed easily.  Psychiatric/Behavioral: Negative for dysphoric mood. The patient is not nervous/anxious.        Objective:   Physical Exam  Constitutional: He is oriented to person, place, and time. He appears well-developed and well-nourished. No distress.  HENT:  Head: Normocephalic and atraumatic.  Right Ear: External ear normal.  Left Ear: External ear normal.  Mouth/Throat: Oropharynx is clear and moist. No oropharyngeal exudate.  Eyes: Conjunctivae and EOM are normal. Pupils are equal, round, and reactive to light. Right eye exhibits no discharge. Left eye exhibits no discharge. No scleral icterus.  Neck: Normal range of motion. Neck supple. No JVD present. No tracheal deviation present. No thyromegaly present.  Cardiovascular: Normal rate, regular rhythm and intact distal pulses.  Exam reveals no gallop and no friction rub.   No murmur heard. Pulmonary/Chest: Effort normal. No respiratory distress. He has no wheezes. He has rales. He exhibits no tenderness.  Abdominal: Soft. Bowel sounds are normal. He exhibits no distension and no mass. There is no tenderness. There is no rebound and no guarding.  Musculoskeletal: Normal range of motion. He exhibits no edema or tenderness.  Lymphadenopathy:    He has no cervical adenopathy.   Neurological: He is alert and oriented to person, place, and time. He has normal reflexes. No cranial nerve deficit. Coordination normal.  Skin: Skin is warm and dry. No rash noted. He is not diaphoretic. No erythema. No pallor.  Psychiatric: He has a normal mood and affect. His behavior is normal. Judgment and thought content normal.  Nursing note and vitals reviewed.    Filed Vitals:   01/26/15 1610  BP: 114/70  Pulse: 99  Height: '5\' 9"'$  (1.753 m)  Weight: 200 lb (90.719 kg)  SpO2: 91%        Assessment & Plan:     ICD-9-CM ICD-10-CM   1. UIP (usual interstitial pneumonitis) ([place]) 515 J84.112     Glad you are feeling stable and improved   - Mixed data about progression compared to October 2015 when he started on CellCept. On CT this progression but breathing test slightly improved. Subjectively same. Appears higher dose of CellCept is working. He reports having had blood work recently with primary care physician  Plan Continue cellcept  '1000mg'$  twice daily Will get blood work from [place], MD - please sign release Continue Bactrim 1 tab Monday, wed Friday Use oxugen OXYGO system  Glad you are uptodate with flu shot  Counseled on prognosis of few to several years of life expectancy but highly unpredictable prognosis requiring serial monitor monitoring  Followup PFT in 6 months  REturn to see me in 6 months afer above  Dr. Brand Males, M.D., [facility].C.P Pulmonary and Critical Care Medicine Staff Physician [place] Pager: [phone], If no answer or between  15:00h - 7:00h: call [phone]  01/26/2015 6:31 PM

## 2015-02-02 ENCOUNTER — Other Ambulatory Visit: Payer: Self-pay | Admitting: Internal Medicine

## 2015-02-20 ENCOUNTER — Encounter: Payer: Self-pay | Admitting: Family Medicine

## 2015-02-26 ENCOUNTER — Other Ambulatory Visit: Payer: Self-pay | Admitting: Family Medicine

## 2015-02-28 ENCOUNTER — Telehealth: Payer: Self-pay

## 2015-02-28 NOTE — Telephone Encounter (Signed)
I have put paperwork in the nurses's box to be filled out by Dr. Brigitte Pulse. He needs paperwork filled out concerning him being released from jury duty. CB # [phone]

## 2015-03-01 NOTE — Telephone Encounter (Signed)
I believe the form is in your box Dr. Brigitte Pulse.

## 2015-03-04 ENCOUNTER — Encounter: Payer: Self-pay | Admitting: Family Medicine

## 2015-03-04 NOTE — Telephone Encounter (Signed)
Letter printed and given to latoria

## 2015-03-16 ENCOUNTER — Other Ambulatory Visit: Payer: Self-pay

## 2015-03-16 MED ORDER — GABAPENTIN 400 MG PO CAPS: 800.0000 mg | ORAL_CAPSULE | Freq: Three times a day (TID) | ORAL | Status: DC

## 2015-03-18 ENCOUNTER — Other Ambulatory Visit: Payer: Self-pay

## 2015-03-18 MED ORDER — MELOXICAM 7.5 MG PO TABS: 7.5000 mg | ORAL_TABLET | Freq: Every day | ORAL | Status: AC | PRN

## 2015-04-29 ENCOUNTER — Other Ambulatory Visit: Payer: Self-pay | Admitting: Family Medicine

## 2015-05-04 ENCOUNTER — Other Ambulatory Visit: Payer: Self-pay | Admitting: Family Medicine

## 2015-05-06 ENCOUNTER — Other Ambulatory Visit: Payer: Self-pay

## 2015-05-06 MED ORDER — PRAVASTATIN SODIUM 40 MG PO TABS: 40.0000 mg | ORAL_TABLET | Freq: Every day | ORAL | Status: DC

## 2015-05-06 NOTE — Telephone Encounter (Signed)
[facility] states they have not received the script sent yesterday.   Please resend pravastatin (PRAVACHOL) 40 MG tablet

## 2015-05-12 ENCOUNTER — Encounter: Payer: Self-pay | Admitting: Family Medicine

## 2015-05-12 ENCOUNTER — Ambulatory Visit (INDEPENDENT_AMBULATORY_CARE_PROVIDER_SITE_OTHER): Payer: Medicare Other | Admitting: Family Medicine

## 2015-05-12 VITALS — BP 124/72 | HR 123 | Temp 98.3°F | Resp 20 | Ht 69.5 in | Wt 204.6 lb

## 2015-05-12 DIAGNOSIS — Z1389 Encounter for screening for other disorder: Secondary | ICD-10-CM

## 2015-05-12 DIAGNOSIS — Z125 Encounter for screening for malignant neoplasm of prostate: Secondary | ICD-10-CM | POA: Diagnosis not present

## 2015-05-12 DIAGNOSIS — Z136 Encounter for screening for cardiovascular disorders: Secondary | ICD-10-CM

## 2015-05-12 DIAGNOSIS — Z23 Encounter for immunization: Secondary | ICD-10-CM

## 2015-05-12 DIAGNOSIS — Z0000 Encounter for general adult medical examination without abnormal findings: Secondary | ICD-10-CM | POA: Diagnosis not present

## 2015-05-12 DIAGNOSIS — Z1383 Encounter for screening for respiratory disorder NEC: Secondary | ICD-10-CM | POA: Diagnosis not present

## 2015-05-12 DIAGNOSIS — D899 Disorder involving the immune mechanism, unspecified: Secondary | ICD-10-CM

## 2015-05-12 DIAGNOSIS — E1142 Type 2 diabetes mellitus with diabetic polyneuropathy: Secondary | ICD-10-CM | POA: Diagnosis not present

## 2015-05-12 DIAGNOSIS — D849 Immunodeficiency, unspecified: Secondary | ICD-10-CM

## 2015-05-12 LAB — POCT CBC
Granulocyte percent: 76 %G (ref 37–80)
HCT, POC: 42.8 % — AB (ref 43.5–53.7)
HEMOGLOBIN: 15.1 g/dL (ref 14.1–18.1)
LYMPH, POC: 3 (ref 0.6–3.4)
MCH, POC: 31.2 pg (ref 27–31.2)
MCHC: 35.3 g/dL (ref 31.8–35.4)
MCV: 88.6 fL (ref 80–97)
MID (cbc): 1.3 — AB (ref 0–0.9)
MPV: 7 fL (ref 0–99.8)
POC Granulocyte: 13.6 — AB (ref 2–6.9)
POC LYMPH PERCENT: 16.5 %L (ref 10–50)
POC MID %: 7.5 %M (ref 0–12)
Platelet Count, POC: 369 10*3/uL (ref 142–424)
RBC: 4.83 M/uL (ref 4.69–6.13)
RDW, POC: 14 %
WBC: 17.9 10*3/uL — AB (ref 4.6–10.2)

## 2015-05-12 LAB — COMPREHENSIVE METABOLIC PANEL
ALK PHOS: 119 U/L — AB (ref 40–115)
ALT: 10 U/L (ref 9–46)
AST: 13 U/L (ref 10–35)
Albumin: 4.2 g/dL (ref 3.6–5.1)
BUN: 24 mg/dL (ref 7–25)
CO2: 22 mmol/L (ref 20–31)
Calcium: 9.4 mg/dL (ref 8.6–10.3)
Chloride: 103 mmol/L (ref 98–110)
Creat: 1.24 mg/dL (ref 0.70–1.25)
GLUCOSE: 135 mg/dL — AB (ref 65–99)
POTASSIUM: 4.3 mmol/L (ref 3.5–5.3)
Sodium: 136 mmol/L (ref 135–146)
Total Bilirubin: 0.3 mg/dL (ref 0.2–1.2)
Total Protein: 7.8 g/dL (ref 6.1–8.1)

## 2015-05-12 LAB — POCT GLYCOSYLATED HEMOGLOBIN (HGB A1C): HEMOGLOBIN A1C: 6

## 2015-05-12 MED ORDER — ZOSTER VACCINE LIVE 19400 UNT/0.65ML ~~LOC~~ SOLR: 0.6500 mL | Freq: Once | SUBCUTANEOUS | Status: DC

## 2015-05-12 NOTE — Patient Instructions (Signed)
Eating Healthy on a Budget There are many ways to save money at the grocery store and continue to eat healthy. You can be successful if you plan your meals according to your budget, purchase according to your budget and grocery list, and prepare food yourself.  How can I buy more food on a limited budget? Plan  Plan meals and snacks according to a grocery list and budget you create.  Look for recipes where you can cook once and make enough food for two meals.  Include meals that will "stretch" more expensive foods such as stews, casseroles, and stir-fry dishes.  Make a grocery list and make sure to bring it with you to the store. If you have a smart phone, you could use your phone to create your shopping list. Purchase  When grocery shopping, buy only the items on your grocery list and go only to the areas of the store that have the items on your list. Prepare  Some meal items can be prepared in advance. Pre-cook on days when you have extra time.  Make extra food (such as by doubling recipes) and freeze the extras in meal-sized containers or in individual portions for fast meals and snacks.  Use leftovers in your meal plan for the week.  Try some meatless meals or try "no cook" meals like salads.  When you come home from the grocery store, wash and prepare your fruits and vegetables so they are ready to use and eat. This will help reduce food waste. How can I buy more food on a limited budget?  Try these tips the next time you go shopping:   [place] store brands or generic brands.  Use coupons only for foods and brands you normally buy. Avoid buying items you wouldn't normally buy simply because they are on sale.  Check online and in newspapers for weekly deals.  Buy healthy items from the bulk bins when available, such as herbs, spices, flours, pastas, nuts, and dried fruit.  Buy fruits and vegetables that are in season. Prices are usually lower on in-season produce.  Compare and  contrast different items. You can do this by looking at the unit price on the price tag. Use it to compare different brands and sizes to find out which item is the best deal.  Choose naturally low-cost healthy items, such as carrots, potatoes, apples, bananas, and oranges. Dried or canned beans are a low-cost protein source.  Buy in bulk and freeze extra food. Items you can buy in bulk include meats, fish, poultry, frozen fruits, and frozen vegetables.  Limit the purchase of prepared or "ready-to-eat" foods, such as pre-cut fruits and vegetables and pre-made salads.  If possible, shop around to discover which grocery store offers the best prices. Some stores charge much more than other stores for the same items.  Do not shop when you are hungry. If you shop while hungry, It may be hard to stick to your list and budget.  Stick to your list and resist impulse buys. Treat your list as your official plan for the week.  Buy a variety of vegetables and fruit by purchasing fresh, frozen, and canned items.  Look beyond eye level. Foods at eye level (adult or child eye level) are more expensive. Look at the top and bottom shelves for deals.  Be efficient with your time when shopping. The more time you spend at the store, the more money you are likely to spend.  Consider other retailers such as dollar  stores, larger [employer], local fruit and vegetable stands, and farmers markets. What are some tips for less expensive food substitutions? When choosing more expensive foods like meats and dairy, try these tips to save money:  Choose cheaper cuts of meat, such as bone-in chicken thighs and drumsticks instead skinless and boneless chicken. When you are ready to prepare the chicken, you can remove the skin yourself to make it healthier.  Choose lean meats like chicken or [place]. When choosing ground beef, make sure it is lean ground beef (92% lean, 8% fat). If you do buy a fattier ground beef,  drain the fat before eating.  Buy dried beans and peas, such as lentils, split peas, or kidney beans.  For seafood, choose canned tuna, salmon, or sardines.  Eggs are a low-cost source of protein.  Buy the larger tubs of yogurt instead of individual-sized containers.  Choose water instead of sodas and other sweetened beverages.  Skip buying chips, cookies, and other "junk food". These items are usually expensive, high in calories, and low in nutritional value. How can I prepare the foods I buy in the healthiest way? Practice these tips for cooking foods in the healthiest way to reduce excess fat and calorie intake:  Steam, saute, grill, or bake foods instead of frying them.  Make sure half your plate is filled with fruits or vegetables. Choose from fresh, frozen, or canned fruits and vegetables. If eating canned, remember to rinse them before eating. This will remove any excess salt added for packaging.  Trim all fat from meat before cooking. Remove the skin from chicken or [place].  Spoon off fat from meat dishes once they have been chilled in the refrigerator and the fat has hardened on the top.  Use skim milk, low-fat milk, or evaporated skim milk when making cream sauces, soups, or puddings.  Substitute low-fat yogurt, sour cream, or cottage cheese for sour cream and mayonnaise in dips and dressings.  Try lemon juice, herbs, or spices to season food instead of salt, butter, or margarine.   This information is not intended to replace advice given to you by your health care provider. Make sure you discuss any questions you have with your health care provider.   Document Released: 11/13/2013 Document Reviewed: 11/13/2013 Elsevier Interactive Patient Education [employer].

## 2015-05-12 NOTE — Progress Notes (Signed)
Subjective:    [name] is a 65 y.o. male who presents for Medicare Annual/Subsequent preventive examination. His last CPE was 04/30/2014 done by myself.  Preventive Screening-Counseling & Management  Tobacco History  Smoking status  . Former Smoker -- 0.50 packs/day for 30 years  . Quit date: 01/25/2008  Smokeless tobacco  . Never Used    Problems Prior to Visit 1. + IFOBT last year 2.    Has O2 sat monitor at home and he is usually up to 92%, his ambulatory/portable monitor does not put out as much when he gets going. Lungs are at the baseline.  Current Problems (verified) Patient Active Problem List   Diagnosis Date Noted  . Encounter for long-term (current) use of high-risk medication 10/01/2014  . Immunosuppressed status ([place]) 07/16/2014  . Polypharmacy 07/16/2014  . Type 2 diabetes mellitus with diabetic polyneuropathy ([place]) 07/16/2014  . Encounter for therapeutic drug monitoring 03/29/2014  . Coronary artery calcification 02/02/2014  . Dyspnea 12/31/2013  . 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) 12/03/2013  . Pulmonary nodules 12/03/2013  . Shingles 10/09/2013  . Post herpetic neuralgia 10/09/2013  . Rheumatoid arthritis ([place]) 06/26/2013  . Hyperlipidemia 01/28/2013  . Type II diabetes mellitus, uncontrolled ([place]) 01/27/2013  . Essential hypertension, benign 01/27/2013  . COUGH 10/10/2009  . PULMONARY FIBROSIS ILD POST INFLAMMATORY CHRONIC 12/30/2007    Medications Prior to Visit Current Outpatient Prescriptions on File Prior to Visit  Medication Sig Dispense Refill  . albuterol (VENTOLIN HFA) 108 (90 BASE) MCG/ACT inhaler Inhale 2 puffs into the lungs every 4 (four) hours as needed for wheezing or shortness of breath. 8.5 each 11  . aspirin 81 MG tablet Take 81 mg by mouth daily.    Marland Kitchen gabapentin (NEURONTIN) 400 MG capsule Take 2 capsules (800 mg total) by mouth 3 (three) times daily. 540 capsule 5  . glucose blood (ONE TOUCH ULTRA TEST) test strip Test  blood sugar daily. Dx code: E11.65 100 each 2  . ipratropium-albuterol (DUONEB) 0.5-2.5 (3) MG/3ML SOLN Take 3 mLs by nebulization every 4 (four) hours as needed. 360 mL 21  . losartan (COZAAR) 50 MG tablet TAKE 1 TABLET BY MOUTH EVERY DAY FOR BLOOD PRESSURE 90 tablet 1  . meloxicam (MOBIC) 7.5 MG tablet Take 1 tablet (7.5 mg total) by mouth daily as needed for pain. Do not use with any other otc pain medication other than acetaminophen 90 tablet 0  . metFORMIN (GLUCOPHAGE) 500 MG tablet Take 1 tablet (500 mg total) by mouth 2 (two) times daily with a meal. FOR DIABETES 180 tablet 1  . mycophenolate (CELLCEPT) 500 MG tablet TAKE 2 TABLETS TWICE DAILY 1 HOUR BEFORE OR 2 HOURS  AFTER  MEAL 120 tablet 5  . pravastatin (PRAVACHOL) 40 MG tablet Take 1 tablet (40 mg total) by mouth at bedtime. FOR CHOLESTEROL 90 tablet 1  . sulfamethoxazole-trimethoprim (BACTRIM DS,SEPTRA DS) 800-160 MG tablet TAKE 1 TABLET BY MOUTH 3 (THREE) TIMES A WEEK. 12 tablet 5   No current facility-administered medications on file prior to visit.    Current Medications (verified) Current Outpatient Prescriptions  Medication Sig Dispense Refill  . albuterol (VENTOLIN HFA) 108 (90 BASE) MCG/ACT inhaler Inhale 2 puffs into the lungs every 4 (four) hours as needed for wheezing or shortness of breath. 8.5 each 11  . aspirin 81 MG tablet Take 81 mg by mouth daily.    Marland Kitchen gabapentin (NEURONTIN) 400 MG capsule Take 2 capsules (800 mg total) by mouth 3 (three) times  daily. 540 capsule 5  . glucose blood (ONE TOUCH ULTRA TEST) test strip Test blood sugar daily. Dx code: E11.65 100 each 2  . ipratropium-albuterol (DUONEB) 0.5-2.5 (3) MG/3ML SOLN Take 3 mLs by nebulization every 4 (four) hours as needed. 360 mL 21  . losartan (COZAAR) 50 MG tablet TAKE 1 TABLET BY MOUTH EVERY DAY FOR BLOOD PRESSURE 90 tablet 1  . meloxicam (MOBIC) 7.5 MG tablet Take 1 tablet (7.5 mg total) by mouth daily as needed for pain. Do not use with any other otc  pain medication other than acetaminophen 90 tablet 0  . metFORMIN (GLUCOPHAGE) 500 MG tablet Take 1 tablet (500 mg total) by mouth 2 (two) times daily with a meal. FOR DIABETES 180 tablet 1  . mycophenolate (CELLCEPT) 500 MG tablet TAKE 2 TABLETS TWICE DAILY 1 HOUR BEFORE OR 2 HOURS  AFTER  MEAL 120 tablet 5  . pravastatin (PRAVACHOL) 40 MG tablet Take 1 tablet (40 mg total) by mouth at bedtime. FOR CHOLESTEROL 90 tablet 1  . sulfamethoxazole-trimethoprim (BACTRIM DS,SEPTRA DS) 800-160 MG tablet TAKE 1 TABLET BY MOUTH 3 (THREE) TIMES A WEEK. 12 tablet 5   No current facility-administered medications for this visit.     Allergies (verified) Review of patient's allergies indicates not on file.   PAST HISTORY  Family History Family History  Problem Relation Age of Onset  . Cancer Father   . Arthritis Sister     Social History Social History  Substance Use Topics  . Smoking status: Former Smoker -- 0.50 packs/day for 30 years    Quit date: 01/25/2008  . Smokeless tobacco: Never Used  . Alcohol Use: No    Are there smokers in your home (other than you)?  No  Risk Factors Current exercise habits: The patient does not participate in regular exercise at present.   Dietary issues discussed: No certain diet.  Cardiac risk factors: advanced age (older than 35 for men, 17 for women), diabetes mellitus, dyslipidemia, hypertension, male gender and sedentary lifestyle.  Depression screen [facility] 2/9 05/12/2015 01/20/2015 04/30/2014 08/14/2013  Decreased Interest 0 0 0 0  Down, Depressed, Hopeless 0 0 0 0  PHQ - 2 Score 0 0 0 0     Depression Screen (Note: if answer to either of the following is "Yes", a more complete depression screening is indicated)   Q1: Over the past two weeks, have you felt down, depressed or hopeless? No  Q2: Over the past two weeks, have you felt little interest or pleasure in doing things? Yes - ca  Have you lost interest or pleasure in daily life? Yes  Do you  often feel hopeless? No  Do you cry easily over simple problems? No  Activities of Daily Living In your prese nt state of health, do you have any difficulty performing the following activities?:  Driving? No Managing money?  No Feeding yourself? No Getting from bed to chair? No Climbing a flight of stairs? Yes  Preparing food and eating?: Yes - goes out to eat most of time -  Bathing or showering? No Getting dressed: No Getting to the toilet? No Using the toilet:No Moving around from place to place: Yes - due to oxygen In the past year have you fallen or had a near fall?:No   Are you sexually active?  No  Do you have more than one partner?  No  Hearing Difficulties: No Do you often ask people to speak up or repeat themselves? No Do you  experience ringing or noises in your ears? No Do you have difficulty understanding soft or whispered voices? No   Do you feel that you have a problem with memory? No  Do you often misplace items? Yes  Do you feel safe at home?  Yes  Cognitive Testing  Alert? Yes  Normal Appearance?Yes  Oriented to person? Yes  Place? Yes   Time? Yes  Recall of three objects?  Yes  Can perform simple calculations? Yes  Displays appropriate judgment?Yes  Can read the correct time from a watch face?Yes   Advanced Directives have been discussed with the patient? Yes - Ala Dach is his beneficiary, friend   List the Names of Other Physician/Practitioners you currently use: 1.  Saw eye doctor last year - doesn't remember who - did get new glasses and no vision change 0 right eye is starting to have a cataract. Denies any sxs.  Indicate any recent Medical Services you may have received from other than [facility] providers in the past year (date may be approximate).  Immunization History  Administered Date(s) Administered  . Influenza,inj,Quad PF,36+ Mos 01/20/2015  . Pneumococcal Conjugate-13 04/30/2014  . Tdap 08/14/2013    Screening Tests Health  Maintenance  Topic Date Due  . COLONOSCOPY  02/28/1999  . ZOSTAVAX  02/27/2009  . OPHTHALMOLOGY EXAM  07/25/2014  . FOOT EXAM  05/01/2015  . PNA vac Low Risk Adult (2 of 2 - PPSV23) 05/01/2015  . HEMOGLOBIN A1C  07/21/2015  . INFLUENZA VACCINE  10/25/2015  . TETANUS/TDAP  08/15/2023  . Hepatitis C Screening  Completed    All answers were reviewed with the patient and necessary referrals were made:  Jammal Sarr, MD   05/12/2015   History reviewed: allergies, current medications, past family history, past medical history, past social history, past surgical history and problem list  Review of Systems Pertinent items are noted in HPI.    Objective:     BP 124/72 mmHg  Pulse 123  Temp(Src) 98.3 F (36.8 C) (Oral)  Resp 20  Ht 5' 9.5" (1.765 m)  Wt 204 lb 9.6 oz (92.806 kg)  BMI 29.79 kg/m2  SpO2 85%  Visual Acuity Screening   Right eye Left eye Both eyes  Without correction:     With correction: '20/70 20/25 20/25 '$   BP 124/72 mmHg  Pulse 123  Temp(Src) 98.3 F (36.8 C) (Oral)  Resp 20  Ht 5' 9.5" (1.765 m)  Wt 204 lb 9.6 oz (92.806 kg)  BMI 29.79 kg/m2  SpO2 85%  General Appearance:    Alert, cooperative, no distress, appears stated age, strong odor of cat urine, wearing oxygen by [place]  Head:    Normocephalic, without obvious abnormality, atraumatic  Eyes:    PERRL, conjunctiva/corneas clear, EOM's intact, fundi    benign, both eyes       Ears:    Normal TM's and external ear canals, both ears  Nose:   Nares normal, septum midline, mucosa normal, no drainage    or sinus tenderness  Throat:   Lips, mucosa, and tongue normal; teeth and gums normal  Neck:   Supple, symmetrical, trachea midline, no adenopathy;       thyroid:  No enlargement/tenderness/nodules; no carotid   bruit or JVD  Back:     Symmetric, no curvature, ROM normal, no CVA tenderness  Lungs:     Increased work of breathing, decreased breath sounds, insp and exp rales.  Chest wall:    No tenderness or  deformity  Heart:    Regular rate and rhythm, S1 and S2 normal, no murmur, rub   or gallop  Abdomen:     Soft, non-tender, bowel sounds active all four quadrants,    no masses, no organomegaly  Genitalia:    Rectal:    Extremities:   Extremities normal, atraumatic, no cyanosis or edema  Pulses:   2+ and symmetric all extremities  Skin:   Skin color, texture, turgor normal, no rashes or lesions  Lymph nodes:   Cervical, supraclavicular, and axillary nodes normal  Neurologic:   CNII-XII intact. Normal strength, sensation and reflexes      throughout       Assessment:     1. Medicare annual wellness visit, subsequent   2. Screening for cardiovascular, respiratory, and genitourinary diseases   3. Screening for prostate cancer   4. Type 2 diabetes mellitus with diabetic polyneuropathy, without long-term current use of insulin ([place])   5. Immunosuppressed status ([place])   6. Need for prophylactic vaccination against Streptococcus pneumoniae (pneumococcus)   7. Need for shingles vaccine          Plan:    no point in home stool test. During the course of the visit the patient was educated and counseled about appropriate screening and preventive services including:    Pneumococcal vaccine   Influenza vaccine  Td vaccine  Prostate cancer screening  Colorectal cancer screening  Glaucoma screening  Nutrition counseling   Advanced directives: has NO advanced directive  - add't info requested. Referral to SW: no  Diet review for nutrition referral? Yes ____  Not Indicated ____  Has seen optho a yr ago - doesn't remember who - rec to gto back. Patient Instructions (the written plan) was given to the patient.  Medicare Attestation I have personally reviewed: The patient's medical and social history Their use of alcohol, tobacco or illicit drugs Their current medications and supplements The patient's functional ability including ADLs,fall risks, home safety risks, cognitive,  and hearing and visual impairment Diet and physical activities Evidence for depression or mood disorders  The patient's weight, height, BMI, and visual acuity have been recorded in the chart.  I have made referrals, counseling, and provided education to the patient based on review of the above and I have provided the patient with a written personalized care plan for preventive services.     [name], MD   05/12/2015         Orders Placed This Encounter  Procedures  . Pneumococcal polysaccharide vaccine 23-valent greater than or equal to 2yo subcutaneous/IM  . Comprehensive metabolic panel  . PSA  . POCT CBC  . POCT glycosylated hemoglobin (Hb A1C)  . HM Diabetes Foot Exam    Meds ordered this encounter  Medications  . zoster vaccine live, PF, (ZOSTAVAX) 04540 UNT/0.65ML injection    Sig: Inject 19,400 Units into the skin once.    Dispense:  1 vial    Refill:  0  . azithromycin (ZITHROMAX) 250 MG tablet    Sig: Take 2 tabs PO x 1 dose, then 1 tab PO QD x 4 days    Dispense:  6 tablet    Refill:  0     [name], MD MPH

## 2015-05-13 LAB — PSA: PSA: 1.21 ng/mL (ref <=4.00)

## 2015-05-15 ENCOUNTER — Encounter: Payer: Self-pay | Admitting: Family Medicine

## 2015-05-15 MED ORDER — AZITHROMYCIN 250 MG PO TABS: ORAL_TABLET | ORAL | Status: DC

## 2015-05-28 ENCOUNTER — Other Ambulatory Visit: Payer: Self-pay | Admitting: Family Medicine

## 2015-06-07 ENCOUNTER — Telehealth: Payer: Self-pay | Admitting: Family Medicine

## 2015-06-07 ENCOUNTER — Telehealth: Payer: Self-pay | Admitting: *Deleted

## 2015-06-07 NOTE — Telephone Encounter (Signed)
[name] from [facility] called for [place] to fax rx to her.  rx faxed to [phone].  Her number is [phone]

## 2015-06-07 NOTE — Telephone Encounter (Signed)
[name]

## 2015-06-07 NOTE — Telephone Encounter (Signed)
Pt stated he was supposed to be receiving an home oxygen rx ... He states this order must be called in to [phone] (unsure of which company).... Pt asked for call back once complete....  [phone]

## 2015-06-07 NOTE — Telephone Encounter (Signed)
I spoke with pt, Dr. Brigitte Pulse states pt has to get his oxygen Rx from his pulmonologist at [facility]. I spoke with April and left her a message to contact the office. Her number is [phone].

## 2015-06-07 NOTE — Telephone Encounter (Signed)
Triage/Elise  pls address this patient's o2 need

## 2015-06-08 NOTE — Telephone Encounter (Signed)
Pt returning call.[name] ° °

## 2015-06-08 NOTE — Telephone Encounter (Signed)
lmtcb for pt.  

## 2015-06-10 NOTE — Telephone Encounter (Signed)
Called and spoke to pt. Pt states he has current O2 but was wanting a POC. Pt states he spoke to [facility] and was advised that they are delivering a newer system on Tuesday (3.21.17). Called [facility] and spoke to [place]. Maudie Mercury states the pt already has a POC that is relatively new and is unsure what pt is needing. Maudie Mercury states she will call pt and then call back if anything is needed from [place].

## 2015-06-30 ENCOUNTER — Other Ambulatory Visit: Payer: Self-pay | Admitting: Internal Medicine

## 2015-07-27 ENCOUNTER — Encounter: Payer: Self-pay | Admitting: Family Medicine

## 2015-08-23 ENCOUNTER — Other Ambulatory Visit: Payer: Self-pay | Admitting: Family Medicine

## 2015-08-23 ENCOUNTER — Other Ambulatory Visit: Payer: Self-pay | Admitting: Internal Medicine

## 2015-09-23 ENCOUNTER — Other Ambulatory Visit: Payer: Self-pay | Admitting: Family Medicine

## 2015-09-24 NOTE — Telephone Encounter (Signed)
Dr Brigitte Pulse, you saw pt for check up this spring, but don't see the Duoneb or related Dx discussed. OK to give RF? You wanted to see pt back in Aug.

## 2015-10-24 ENCOUNTER — Other Ambulatory Visit: Payer: Self-pay | Admitting: Internal Medicine

## 2015-10-26 ENCOUNTER — Other Ambulatory Visit: Payer: Self-pay | Admitting: Family Medicine

## 2015-11-08 ENCOUNTER — Other Ambulatory Visit: Payer: Self-pay | Admitting: Internal Medicine

## 2015-11-08 DIAGNOSIS — J8410 Pulmonary fibrosis, unspecified: Secondary | ICD-10-CM

## 2015-11-09 ENCOUNTER — Encounter (HOSPITAL_COMMUNITY): Payer: Medicare Other

## 2015-11-09 ENCOUNTER — Encounter: Payer: Self-pay | Admitting: Internal Medicine

## 2015-11-09 ENCOUNTER — Ambulatory Visit (INDEPENDENT_AMBULATORY_CARE_PROVIDER_SITE_OTHER): Payer: Medicare Other | Admitting: Internal Medicine

## 2015-11-09 VITALS — BP 146/90 | HR 94 | Ht 69.5 in | Wt 200.0 lb

## 2015-11-09 DIAGNOSIS — J9611 Chronic respiratory failure with hypoxia: Secondary | ICD-10-CM | POA: Diagnosis not present

## 2015-11-09 DIAGNOSIS — Z79899 Other long term (current) drug therapy: Secondary | ICD-10-CM | POA: Diagnosis not present

## 2015-11-09 DIAGNOSIS — J84112 Idiopathic pulmonary fibrosis: Secondary | ICD-10-CM

## 2015-11-09 NOTE — Patient Instructions (Signed)
ICD-9-CM ICD-10-CM   1. UIP (usual interstitial pneumonitis) (HCC) 515 J84.112   2. Encounter for long-term (current) use of high-risk medication V58.69 Z79.899   3. Chronic respiratory failure with hypoxia (HCC) 518.83 J96.11    799.02      Disease might have progressed  Plan  - do cbc, bmet, lft for safety monitoring of drugs  - doo HRCT and PFT to assess disease progression  followup - return next few weeks to see me after completing above

## 2015-11-09 NOTE — Progress Notes (Signed)
Subjective:     Patient ID: [name], male   DOB: Jul 18, 1950, 65 y.o.   MRN: [id number]  HPI   PC Galen Daft, MD Rheum: Dr Bo Merino  HPI  IOV 12/03/2013    Chief Complaint  Patient presents with  . Pulmonary Consult    Referred by Dr. Estanislado Pandy for ILD.    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 He is an extremely poor historian. Information is gathered from him, outside records, [facility] interstitial lung disease clinic 2013 notes  As best as I can tell patient has rheumatoid arthritis since approximately 2010. He is being treated with Celebrex and Humira for possibly the same amount of time. His symptoms are largely limited to the joints. Apparently these medications to control his joint pain. 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 He was followed at [facility] interstitial lung disease clinic by Dr. Wynn Maudlin. Last visit was recorded in the spring of 2013 ital capacity was 58% and diffusion capacity was 40% consistent with moderate interstitial lung disease. Patient himself reports moderate of cough and moderate amount of dyspnea. He says that both these symptoms only impacting his quality of life to a mild extent and is able to live well despite these symptoms. In the office he desaturated immediately after standing up but despite this he feels that his symptoms are impacting his quality of life a little mild extent.  He would not tell me why he switched from [facility] but it appears that probably because he lives alone  Review of this imaging shows that he's had pulmonary fibrosis at least as of 2005 which means it predates the diagnoses of rheumatoid arthritis which is typically the case in many men  CT scan of the chest 06/28/2010: In my opinion has classic UIP features with significant  honeycombing. This is very similar to a CT scan of the chest in September 2009 and February 2005 but likely worse compared to 2005  In terms of his rheumatoid arthritis according to outside notes his positive rheumatoid factor and positive CCP. He does not have any synovitis on exam as of July 2015.   Other issues    - . reports that he quit smoking about 5 years ago. He has never used smokeless tobacco.  - [facility] records suggest that he had right middle lobe nodule that had moderate activity on PET scan in 2013. These have not been followed up since then. Patient completely denies knowledge of these  REC #Pulmonary FIbrosis - UIP due to Rheumatoid ARthritis  - this is due to Rheumatoid Arthritis  - start portable oxygen system 2L [place] with walking; will help you - do HRCT wo contrast - do office spirometry at followup   #Lung nodules seen on CT in 2013 at [facility]  - do HRCT wo contrast  #Followup 1 month - after completing above Office Spirometry (not [place]ap) at time of followup   OV 12/31/2013  Chief Complaint  Patient presents with  . Follow-up    Pt states his breathing has improved since last visit. Pt states his SOB has improved. Pt c/o intermittent dry cough. Pt denies CP/tightness.    HEre to review results. Poor hx with RA and UIP. COncern is that UIP is progresslive.  HE feels fine. AGain, talks very little. Spirometry compared to 2013 shows progressive disease;  FVC now is 51% but was 58% in 2013. CT chest sept 2015 also shows progressive disease compared to 2012. HE says he does not feel the progression. HE is open to listening to my recommendations. He is willing to try cellcept to slow progression of disease    SPirro 12/31/2013  - fvc  2.3L/51%, fev1 1.99L/57%, ratio 87   CT chest 12/10/13: COMPARISON: Chest CT 06/28/2010 IMPRESSION:  1. The appearance of the chest is most compatible with progressively  worsening usual interstitial pneumonia (UIP), as discussed  above.   2. Multiple borderline enlarged and mildly enlarged mediastinal and  bilateral hilar lymph nodes are presumably benign and reactive in  the setting of chronic interstitial lung disease.   3. Atherosclerosis, including left circumflex coronary artery  disease. Please note that although the presence of coronary artery  calcium documents the presence of coronary artery disease, the  severity of this disease and any potential stenosis cannot be  assessed on this non-gated CT examination. Assessment for potential  risk factor modification, dietary therapy or pharmacologic therapy  may be warranted, if clinically indicated.  Electronically Signed  By: Vinnie Langton M.D.  On: 12/10/2013 15:26    REC Start cellcept  01/29/2014 Follow up (Rheumatoid arthritis related UIP type of pulmonary fibrosis.) Returns for follow up .  Started on Cellcept last ov, has been on for last 1-2 weeks, took a while to  [employer].  Was seen 2 days ago by PCP dx w/ bronchitis , tx w/ Levaquin  CXR w/ no acute changes.  Says cough and congestion are getting better Denies chest pain, orthopnea, edema or fever.      OV 03/29/2014  Chief Complaint  Patient presents with  . Follow-up    Pt stated he has more energy since last OV. Pt c/o DOE, mild dry cough. Pt denies CP/tightness.     Follow-up rheumatoid arthritis interstitial lung disease UIP pattern  I started CellCept early October 2015. After this he followed up with my nurse practitioner in November 2015. Between those 2 visits he did have some bronchitis and was treated with Levaquin. He says that he is feeling better in terms of his dyspnea and cough. He still continues to smoke. His last lab work was November 2015 and normal. In December 2015 I did speak with his rheumatologist who has determined that his rheumatoid arthritis is largely confined to the lungs and over time she will slowly stop his Humira. Because he is already  on too strong immunomodulators of Humira and CellCept we have held off on starting prednisone at this stage. He is due for lab work right now. He has not had pulmonary function test. He uses his oxygen nasal cannula. He is interested in [employer] system, his current CellCept dose is '500mg'$  twice dail; not been increased yet   OV 06/29/2014  Chief Complaint  Patient presents with  . Follow-up    Pt states breathing is still the same. c/o cough with small amount of mucus, white in color.  Denies any wheezing, chest tightness/congestion.     Follow-up rheumatoid arthritis interstitial lung disease UIP pattern without evidence of systemic RA. I started CellCept early October 2015; '500mg'$  bid + bactrim (not on prednisone because was being weaned off humira). Overall stable. Has baseline cough, and mucus. He smells of tobacco - entire room does but he denies he is actively smoking. Says smell is from friends he hangs out who smoke. 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 OFf humira I suppose. Not yet on prednisone. He is open to increasing dose of cellcept  OV 01/26/2015  Chief Complaint  Patient presents with  . Follow-up    Pt here after HRCT and PFT. Pt states his breathing is unchanged since last OV. Pt c/o cough with intermittent mucus production - white mucus. Pt denies CP/tightness.    Follow-up rheumatoid arthritis interstitial lung disease UIP pattern without evidence of systemic rheumatoid arthritis. On CellCept since October 2015 dose of 1000 mg twice a day since April 2016. Overall stable. Uses oxygen. Has baseline cough and mucus. As always he smells of tobacco but as always he denies using tobacco. He had CT scan of the chest 10/05/20160 that I personally visualized. Shows UIP pattern that has progressed since September 2015. However spirometry at the same time show stability/slight  improvement. His FVC today is 2.6 L/59% compared to 2.3 L/51% in October 2015. Diffusion capacity is 40.98/11% which certainly has declined compared to a few years ago.  For the first time in his life he suddenly started asking me prognosis questions about life expectancy, manner of death of death and if this a lifelong disease.   OV 11/09/2015  Chief Complaint  Patient presents with  . Follow-up    Pt states his breathing has worsened since last OV. Pt c/o nonprod cough. Pt denies CP/tightness and f/c/s.     Follow-up rheumatoid arthritis related interstitial lung disease UIP pattern u without evidence of systemic rheumatoid arthritis. On CellCept since October 2015 and on her dose of 1000 mg twice a day since April 2016. Last seen November 2016. Since then he says his progressive dyspnea. Walking "short distances" makes him more short of breath not much of cough or mucus. He says he is compliant with his medications. He feels he is progressive disease. He has not had pulmonary function test CT chest on blood work for therapeutic monitoring in a long time. No other issues no chest pain or edema   has a past medical history of Diabetes mellitus without complication ([place]); Dyspnea; GERD (gastroesophageal reflux disease); Hypertension; Pulmonary fibrosis ([place]); and Rheumatoid arthritis(714.0) (2005).   reports that he quit smoking about 7 years ago. He has a 15.00 pack-year smoking history. He has never used smokeless tobacco.  Past Surgical History:  Procedure Laterality Date  . Excision mass R lateral arm 2010  2010  . EYE SURGERY    . MASS EXCISION  03/08/2011   Procedure: EXCISION MASS;  Surgeon: Cammie Sickle., MD;  Location: [facility];  Service: Orthopedics;  Laterality: Right;  excision rheumatoid nodule right ulna  . NECK SURGERY      No Known Allergies  Immunization History  Administered Date(s) Administered  . Influenza,inj,Quad PF,36+ Mos 01/20/2015  .  Pneumococcal Conjugate-13 04/30/2014  . Pneumococcal Polysaccharide-23 05/12/2015  . Tdap 08/14/2013    Family History  Problem Relation Age of Onset  . Cancer Father   . Arthritis Sister      Current Outpatient Prescriptions:  .  albuterol (VENTOLIN HFA) 108 (90 BASE) MCG/ACT inhaler, Inhale 2 puffs into the lungs every 4 (four) hours as needed for wheezing or shortness of breath., Disp: 8.5 each, Rfl: 11 .  aspirin 81 MG tablet, Take 81 mg by mouth daily., Disp: , Rfl:  .  gabapentin (NEURONTIN) 400 MG capsule, Take 2 capsules (  800 mg total) by mouth 3 (three) times daily., Disp: 540 capsule, Rfl: 5 .  ipratropium-albuterol (DUONEB) 0.5-2.5 (3) MG/3ML SOLN, TAKE 3 MLS BY NEBULIZATION EVERY 4 (FOUR) HOURS AS NEEDED., Disp: 360 mL, Rfl: 3 .  losartan (COZAAR) 50 MG tablet, TAKE 1 TABLET BY MOUTH EVERY DAY FOR BLOOD PRESSURE, Disp: 90 tablet, Rfl: 0 .  meloxicam (MOBIC) 7.5 MG tablet, Take 1 tablet (7.5 mg total) by mouth daily as needed for pain. Do not use with any other otc pain medication other than acetaminophen, Disp: 90 tablet, Rfl: 0 .  metFORMIN (GLUCOPHAGE) 500 MG tablet, TAKE 1 TABLET (500 MG TOTAL) BY MOUTH 2 (TWO) TIMES DAILY WITH A MEAL. FOR DIABETES, Disp: 180 tablet, Rfl: 0 .  mycophenolate (CELLCEPT) 500 MG tablet, TAKE 2 TABLETS TWICE DAILY 1 HOUR BEFORE OR 2 HOURS  AFTER  MEAL, Disp: 120 tablet, Rfl: 5 .  ONE TOUCH ULTRA TEST test strip, TEST BLOOD SUGAR DAILY. DX CODE: E11.65, Disp: 100 each, Rfl: 0 .  pravastatin (PRAVACHOL) 40 MG tablet, TAKE 1 TABLET (40 MG TOTAL) BY MOUTH AT BEDTIME. FOR CHOLESTEROL, Disp: 90 tablet, Rfl: 0 .  sulfamethoxazole-trimethoprim (BACTRIM DS,SEPTRA DS) 800-160 MG tablet, TAKE 1 TABLET BY MOUTH THREE TIMES A WEEK (NEED APPT FOR MORE REFILLS!!), Disp: 12 tablet, Rfl: 2 .  zoster vaccine live, PF, (ZOSTAVAX) 14481 UNT/0.65ML injection, Inject 19,400 Units into the skin once., Disp: 1 vial, Rfl: 0    Review of Systems     Objective:    Physical Exam  Constitutional: He is oriented to person, place, and time. He appears well-developed and well-nourished. No distress.  Disheveled Dirty clothes Smells +  HENT:  Head: Normocephalic and atraumatic.  Right Ear: External ear normal.  Left Ear: External ear normal.  Mouth/Throat: Oropharynx is clear and moist. No oropharyngeal exudate.  Eyes: Conjunctivae and EOM are normal. Pupils are equal, round, and reactive to light. Right eye exhibits no discharge. Left eye exhibits no discharge. No scleral icterus.  Neck: Normal range of motion. Neck supple. No JVD present. No tracheal deviation present. No thyromegaly present.  Cardiovascular: Normal rate, regular rhythm and intact distal pulses.  Exam reveals no gallop and no friction rub.   No murmur heard. Pulmonary/Chest: Effort normal. No respiratory distress. He has no wheezes. He has rales. He exhibits no tenderness.  Extensive crackles  Abdominal: Soft. Bowel sounds are normal. He exhibits no distension and no mass. There is no tenderness. There is no rebound and no guarding.  Musculoskeletal: Normal range of motion. He exhibits no edema or tenderness.  Lymphadenopathy:    He has no cervical adenopathy.  Neurological: He is alert and oriented to person, place, and time. He has normal reflexes. No cranial nerve deficit. Coordination normal.  Skin: Skin is warm and dry. No rash noted. He is not diaphoretic. No erythema. No pallor.  Psychiatric: Judgment and thought content normal.  Nursing note and vitals reviewed.  Vitals:   11/09/15 1408  BP: (!) 146/90  Pulse: 94  SpO2: 90%  Weight: 200 lb (90.7 kg)  Height: 5' 9.5" (1.765 m)       Assessment:       ICD-9-CM ICD-10-CM   1. UIP (usual interstitial pneumonitis) ([place]) 515 J84.112 CBC w/Diff  2. Encounter for long-term (current) use of high-risk medication V58.69 Z79.899 CBC w/Diff     Basic Metabolic Panel (BMET)     Hepatic function panel     CT Chest High  Resolution  Pulmonary function test  3. Chronic respiratory failure with hypoxia (HCC) 518.83 J96.11 CBC w/Diff   128.20  Basic Metabolic Panel (BMET)     Hepatic function panel     CT Chest High Resolution     Pulmonary function test       Plan:      Disease Likely have progressed  Plan - Continue to use oxygen 4-5 liters  - do cbc, bmet, lft for safety monitoring of drugs  - doo HRCT and PFT to assess disease progression  followup - return next few weeks to see me after completing above   > 50% of this > 25 min visit spent in face to face counseling or coordination of care    Dr. Brand Males, M.D., [facility].C.P Pulmonary and Critical Care Medicine Staff Physician [place] [facility] Pager: [phone], If no answer or between  15:00h - 7:00h: call [phone]  11/09/2015 2:27 PM

## 2015-11-09 NOTE — Addendum Note (Signed)
Addended by: Collier Salina on: 11/09/2015 02:54 PM   Modules accepted: Orders

## 2015-11-10 ENCOUNTER — Ambulatory Visit (INDEPENDENT_AMBULATORY_CARE_PROVIDER_SITE_OTHER): Payer: Medicare Other | Admitting: Family Medicine

## 2015-11-10 ENCOUNTER — Encounter: Payer: Self-pay | Admitting: Family Medicine

## 2015-11-10 VITALS — BP 116/72 | HR 68 | Temp 97.2°F | Resp 18 | Ht 69.5 in | Wt 199.8 lb

## 2015-11-10 DIAGNOSIS — J84112 Idiopathic pulmonary fibrosis: Secondary | ICD-10-CM | POA: Diagnosis not present

## 2015-11-10 DIAGNOSIS — D899 Disorder involving the immune mechanism, unspecified: Secondary | ICD-10-CM

## 2015-11-10 DIAGNOSIS — I10 Essential (primary) hypertension: Secondary | ICD-10-CM | POA: Diagnosis not present

## 2015-11-10 DIAGNOSIS — E785 Hyperlipidemia, unspecified: Secondary | ICD-10-CM | POA: Diagnosis not present

## 2015-11-10 DIAGNOSIS — D849 Immunodeficiency, unspecified: Secondary | ICD-10-CM

## 2015-11-10 DIAGNOSIS — Z79899 Other long term (current) drug therapy: Secondary | ICD-10-CM

## 2015-11-10 DIAGNOSIS — E1142 Type 2 diabetes mellitus with diabetic polyneuropathy: Secondary | ICD-10-CM | POA: Diagnosis not present

## 2015-11-10 LAB — CBC WITH DIFFERENTIAL/PLATELET
BASOS ABS: 123 {cells}/uL (ref 0–200)
Basophils Relative: 1 %
EOS ABS: 615 {cells}/uL — AB (ref 15–500)
EOS PCT: 5 %
HCT: 42.8 % (ref 38.5–50.0)
Hemoglobin: 14.2 g/dL (ref 13.2–17.1)
Lymphocytes Relative: 20 %
Lymphs Abs: 2460 cells/uL (ref 850–3900)
MCH: 30.1 pg (ref 27.0–33.0)
MCHC: 33.2 g/dL (ref 32.0–36.0)
MCV: 90.9 fL (ref 80.0–100.0)
MONOS PCT: 7 %
MPV: 9.1 fL (ref 7.5–12.5)
Monocytes Absolute: 861 cells/uL (ref 200–950)
Neutro Abs: 8241 cells/uL — ABNORMAL HIGH (ref 1500–7800)
Neutrophils Relative %: 67 %
PLATELETS: 356 10*3/uL (ref 140–400)
RBC: 4.71 MIL/uL (ref 4.20–5.80)
RDW: 13.3 % (ref 11.0–15.0)
WBC: 12.3 10*3/uL — ABNORMAL HIGH (ref 3.8–10.8)

## 2015-11-10 LAB — COMPLETE METABOLIC PANEL WITH GFR
ALT: 14 U/L (ref 9–46)
AST: 15 U/L (ref 10–35)
Albumin: 4.1 g/dL (ref 3.6–5.1)
Alkaline Phosphatase: 104 U/L (ref 40–115)
BILIRUBIN TOTAL: 0.4 mg/dL (ref 0.2–1.2)
BUN: 21 mg/dL (ref 7–25)
CHLORIDE: 104 mmol/L (ref 98–110)
CO2: 20 mmol/L (ref 20–31)
Calcium: 9.5 mg/dL (ref 8.6–10.3)
Creat: 1.07 mg/dL (ref 0.70–1.25)
GFR, EST AFRICAN AMERICAN: 83 mL/min (ref >=60)
GFR, EST NON AFRICAN AMERICAN: 72 mL/min (ref >=60)
Glucose, Bld: 87 mg/dL (ref 65–99)
POTASSIUM: 4.5 mmol/L (ref 3.5–5.3)
SODIUM: 139 mmol/L (ref 135–146)
Total Protein: 7.7 g/dL (ref 6.1–8.1)

## 2015-11-10 LAB — LIPID PANEL
CHOL/HDL RATIO: 2.9 ratio (ref <=5.0)
Cholesterol: 132 mg/dL (ref 125–200)
HDL: 45 mg/dL (ref >=40)
LDL CALC: 67 mg/dL (ref <130)
Triglycerides: 100 mg/dL (ref <150)
VLDL: 20 mg/dL (ref <30)

## 2015-11-10 LAB — POCT GLYCOSYLATED HEMOGLOBIN (HGB A1C): Hemoglobin A1C: 6.5

## 2015-11-10 MED ORDER — BUPROPION HCL ER (XL) 150 MG PO TB24: ORAL_TABLET | ORAL | 5 refills | Status: DC

## 2015-11-10 NOTE — Patient Instructions (Addendum)
GO TO YOUR EYE DOCTOR AND GET YOUR EXAM UPDATED.     IF you received an x-ray today, you will receive an invoice from [facility]. Please contact [facility] at [phone] with questions or concerns regarding your invoice.   IF you received labwork today, you will receive an invoice from [employer]. Please contact [facility] at [phone] with questions or concerns regarding your invoice.   Our billing staff will not be able to assist you with questions regarding bills from these companies.  You will be contacted with the lab results as soon as they are available. The fastest way to get your results is to activate your My Chart account. Instructions are located on the last page of this paperwork. If you have not heard from [place] regarding the results in 2 weeks, please contact this office.     Diabetes and Foot Care Diabetes may cause you to have problems because of poor blood supply (circulation) to your feet and legs. This may cause the skin on your feet to become thinner, break easier, and heal more slowly. Your skin may become dry, and the skin may peel and crack. You may also have nerve damage in your legs and feet causing decreased feeling in them. You may not notice minor injuries to your feet that could lead to infections or more serious problems. Taking care of your feet is one of the most important things you can do for yourself.  HOME CARE INSTRUCTIONS  Wear shoes at all times, even in the house. Do not go barefoot. Bare feet are easily injured.  Check your feet daily for blisters, cuts, and redness. If you cannot see the bottom of your feet, use a mirror or ask someone for help.  Wash your feet with warm water (do not use hot water) and mild soap. Then pat your feet and the areas between your toes until they are completely dry. Do not soak your feet as this can dry your skin.  Apply a moisturizing lotion or petroleum jelly (that does  not contain alcohol and is unscented) to the skin on your feet and to dry, brittle toenails. Do not apply lotion between your toes.  Trim your toenails straight across. Do not dig under them or around the cuticle. File the edges of your nails with an emery board or nail file.  Do not cut corns or calluses or try to remove them with medicine.  Wear clean socks or stockings every day. Make sure they are not too tight. Do not wear knee-high stockings since they may decrease blood flow to your legs.  Wear shoes that fit properly and have enough cushioning. To break in new shoes, wear them for just a few hours a day. This prevents you from injuring your feet. Always look in your shoes before you put them on to be sure there are no objects inside.  Do not cross your legs. This may decrease the blood flow to your feet.  If you find a minor scrape, cut, or break in the skin on your feet, keep it and the skin around it clean and dry. These areas may be cleansed with mild soap and water. Do not cleanse the area with peroxide, alcohol, or iodine.  When you remove an adhesive bandage, be sure not to damage the skin around it.  If you have a wound, look at it several times a day to make sure it is healing.  Do not use heating pads or  hot water bottles. They may burn your skin. If you have lost feeling in your feet or legs, you may not know it is happening until it is too late.  Make sure your health care provider performs a complete foot exam at least annually or more often if you have foot problems. Report any cuts, sores, or bruises to your health care provider immediately. SEEK MEDICAL CARE IF:   You have an injury that is not healing.  You have cuts or breaks in the skin.  You have an ingrown nail.  You notice redness on your legs or feet.  You feel burning or tingling in your legs or feet.  You have pain or cramps in your legs and feet.  Your legs or feet are numb.  Your feet always feel  cold. SEEK IMMEDIATE MEDICAL CARE IF:   There is increasing redness, swelling, or pain in or around a wound.  There is a red line that goes up your leg.  Pus is coming from a wound.  You develop a fever or as directed by your health care provider.  You notice a bad smell coming from an ulcer or wound.   This information is not intended to replace advice given to you by your health care provider. Make sure you discuss any questions you have with your health care provider.   Document Released: 03/09/2000 Document Revised: 11/12/2012 Document Reviewed: 08/19/2012 Elsevier Interactive Patient Education [employer].

## 2015-11-10 NOTE — Progress Notes (Signed)
Subjective:    Patient ID: DENIRO LAYMON, male    DOB: [birth year], 65 y.o.   MRN: [id number] Chief Complaint  Patient presents with  . Follow-up    6 mo follow up    HPI  Mr. Mineau is a 65 yo male here to follow-up on his chronic medical conditions.  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 diagnosed around 2010. Was initially followed at [facility] until 2013. Now follows with pulmonology Dr. Chase Caller who he saw yesterday. On cellcept since 12/2013. May need to consider prednisone at some point. Takes bactrim 3x/wk for preventative,  Uses prn duonebs.  He is on 4-5L continuous oxygen and has a O2 sat monitor - his baseline remains about 92%. At pt's pulm visit yest, pt was c/o progressive dyspnea with walking short distances so there was concern for sig disease progression and was rec to repeat chest CT and PFTs. GERD: not an issue CAD: Takes asa '81mg'$ . DMII: On metformin 500 bid.  hgba1c well controlled 6.1 -> 5.7 -> 6.0->6.5. On gabapentin '800mg'$  tid for peripheral neuropathy pain. optho exam done in 2016 - has glasses and is developing a cataract. - Dr. Dema Severin male on [address]. RA with + RF and ccp: since 2010. Was prev on celebrex and humira but disease was largely confined to lungs with minimal synovitis so was able to coe off of these. follows with rheumatology Dr. Estanislado Pandy. On meloxicam 7.5 qd prn - needs periodically but not regularly HPL goal <70: On pravastatin 40. Last lipid panel was 9 mos prior 12/2014 with ldl at goal of 71 and non HDL of 98.  Did have + IFOBT 2016 but pt is unwilling to follow this up - does not want to consider going to GI for further eval even if he had colon cancer so will stop testing. He thinks it was from a hard stool, Still have neuropathy pain from his shingles. sleeing well No lower ext neuropathy  Depression screen [facility] 2/9 11/10/2015 05/12/2015 01/20/2015 04/30/2014 08/14/2013    Decreased Interest - 0 0 0 0  Down, Depressed, Hopeless 0 0 0 0 0  PHQ - 2 Score 0 0 0 0 0   Past Medical History:  Diagnosis Date  . Diabetes mellitus without complication ([place])   . Dyspnea   . GERD (gastroesophageal reflux disease)   . Hypertension   . Pulmonary fibrosis ([place])   . Rheumatoid arthritis(714.0) 2005   Past Surgical History:  Procedure Laterality Date  . Excision mass R lateral arm 2010  2010  . EYE SURGERY    . MASS EXCISION  03/08/2011   Procedure: EXCISION MASS;  Surgeon: Cammie Sickle., MD;  Location: [place];  Service: Orthopedics;  Laterality: Right;  excision rheumatoid nodule right ulna  . NECK SURGERY     Current Outpatient Prescriptions on File Prior to Visit  Medication Sig Dispense Refill  . albuterol (VENTOLIN HFA) 108 (90 BASE) MCG/ACT inhaler Inhale 2 puffs into the lungs every 4 (four) hours as needed for wheezing or shortness of breath. 8.5 each 11  . aspirin 81 MG tablet Take 81 mg by mouth daily.    Marland Kitchen gabapentin (NEURONTIN) 400 MG capsule Take 2 capsules (800 mg total) by mouth 3 (three) times daily. 540 capsule 5  . ipratropium-albuterol (DUONEB) 0.5-2.5 (3) MG/3ML SOLN TAKE 3 MLS BY NEBULIZATION EVERY 4 (FOUR) HOURS AS NEEDED. 360 mL  3  . meloxicam (MOBIC) 7.5 MG tablet Take 1 tablet (7.5 mg total) by mouth daily as needed for pain. Do not use with any other otc pain medication other than acetaminophen 90 tablet 0  . metFORMIN (GLUCOPHAGE) 500 MG tablet TAKE 1 TABLET (500 MG TOTAL) BY MOUTH 2 (TWO) TIMES DAILY WITH A MEAL. FOR DIABETES 180 tablet 0  . mycophenolate (CELLCEPT) 500 MG tablet TAKE 2 TABLETS TWICE DAILY 1 HOUR BEFORE OR 2 HOURS  AFTER  MEAL 120 tablet 5  . ONE TOUCH ULTRA TEST test strip TEST BLOOD SUGAR DAILY. DX CODE: E11.65 100 each 0  . pravastatin (PRAVACHOL) 40 MG tablet TAKE 1 TABLET (40 MG TOTAL) BY MOUTH AT BEDTIME. FOR CHOLESTEROL 90 tablet 0  . sulfamethoxazole-trimethoprim (BACTRIM DS,SEPTRA DS)  800-160 MG tablet TAKE 1 TABLET BY MOUTH THREE TIMES A WEEK (NEED APPT FOR MORE REFILLS!!) 12 tablet 2  . zoster vaccine live, PF, (ZOSTAVAX) 02725 UNT/0.65ML injection Inject 19,400 Units into the skin once. (Patient not taking: Reported on 11/25/2015) 1 vial 0   No current facility-administered medications on file prior to visit.    No Known Allergies Family History  Problem Relation Age of Onset  . Cancer Father   . Rheum arthritis Sister   . Lung disease Neg Hx    Social History   Social History  . Marital status: Single    Spouse name: N/A  . Number of children: N/A  . Years of education: N/A   Occupational History  . retired   .  Retired   Social History Main Topics  . Smoking status: Former Smoker    Packs/day: 0.50    Years: 39.00    Start date: 02/27/1969    Quit date: 01/25/2008  . Smokeless tobacco: Never Used  . Alcohol use No  . Drug use: No  . Sexual activity: No   Other Topics Concern  . None   Social History Narrative   Single   Exercise: NO      [place] [facility]:   Originally from [place]. Has previously lived in [place]. He attended school in [place]. He served in [place]. 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 Has a cat at home. No bird exposure. No mold exposure.        Review of Systems  Constitutional: Positive for activity change and fatigue. Negative for appetite change, chills, diaphoresis and fever.  Eyes: Positive for visual disturbance. Negative for pain, discharge and redness.  Respiratory: Positive for cough, chest tightness, shortness of breath and wheezing. Negative for choking.   Cardiovascular: Negative for chest pain and leg swelling.  Gastrointestinal: Negative for abdominal distention, abdominal pain, anal bleeding, blood in stool, constipation, diarrhea, nausea and vomiting.  Musculoskeletal: Positive for arthralgias, back pain, gait problem, joint swelling  and myalgias.  Skin: Negative for rash.  Allergic/Immunologic: Positive for immunocompromised state.  Neurological: Negative for dizziness, syncope, facial asymmetry, weakness, light-headedness, numbness and headaches.  Psychiatric/Behavioral: Negative for agitation, behavioral problems, confusion, decreased concentration, dysphoric mood and sleep disturbance.       Objective:   Physical Exam  Constitutional: He is oriented to person, place, and time. He appears well-developed and well-nourished. No distress.  Strong odor of cat urine, unhygienic appearance - both are the chronic baseline for pt.  HENT:  Head: Normocephalic and atraumatic.  Eyes: Conjunctivae are normal. Pupils are equal, round, and reactive to light. No  scleral icterus.  Neck: Normal range of motion. Neck supple. No thyromegaly present.  Cardiovascular: Normal rate, regular rhythm, normal heart sounds and intact distal pulses.   Pulmonary/Chest: No respiratory distress. He has decreased breath sounds. He has rales.  Wearing portable oxygen  Musculoskeletal: He exhibits no edema.  Lymphadenopathy:    He has no cervical adenopathy.  Neurological: He is alert and oriented to person, place, and time.  Skin: Skin is warm and dry. He is not diaphoretic.  Psychiatric: He has a normal mood and affect. His behavior is normal.    BP 116/72   Pulse 68   Temp 97.2 F (36.2 C) (Oral)   Resp 18   Ht 5' 9.5" (1.765 m)   Wt 199 lb 12.8 oz (90.6 kg)   SpO2 92%   BMI 29.08 kg/m  Diabetic Foot Exam - Simple   Simple Foot Form Diabetic Foot exam was performed with the following findings:  Yes 11/10/2015  8:56 AM  Visual Inspection No deformities, no ulcerations, no other skin breakdown bilaterally:  Yes Sensation Testing Intact to touch and monofilament testing bilaterally:  Yes Pulse Check Posterior Tibialis and Dorsalis pulse intact bilaterally:  Yes Comments       Results for orders placed or performed in visit on  11/10/15  POCT glycosylated hemoglobin (Hb A1C)  Result Value Ref Range   Hemoglobin A1C 6.5     Assessment & Plan:  Can you get shingles vaccine on cellcept? Due for AWV in Feb 2018 a1c, urine microalb - u nable to urinate today  Lipid-  is fasting today-   Needs optho exam Cbc and cmp ordered yest??? P??  Over 40 min spent in face-to-face evaluation of and consultation with patient and coordination of care.  Over 50% of this time was spent counseling this patient. 1. Type 2 diabetes mellitus with diabetic polyneuropathy, without long-term current use of insulin ([place]) - reminded pt to f/u with optho and have copy of note sent to [place]  2. Encounter for long-term (current) use of high-risk medication   3. Hyperlipidemia   4. Immunosuppressed status ([place])   5. Polypharmacy   6. Essential hypertension, benign   7. UIP (usual interstitial pneumonitis) ([place])     Orders Placed This Encounter  Procedures  . CBC with Differential/Platelet  . COMPLETE METABOLIC PANEL WITH GFR  . Lipid panel    Order Specific Question:   Has the patient fasted?    Answer:   Yes  . POCT glycosylated hemoglobin (Hb A1C)    Meds ordered this encounter  Medications  . buPROPion (WELLBUTRIN XL) 150 MG 24 hr tablet    Sig: Take 1 tab po qd, then increase to 2 tabs in the morning in 2 weeks if needed.    Dispense:  60 tablet    Refill:  5    [name], M.D.  [facility] [address] [place], [place] [address] [phone] phone [phone] fax  11/28/15 9:55 AM

## 2015-11-11 ENCOUNTER — Telehealth: Payer: Self-pay | Admitting: Internal Medicine

## 2015-11-11 ENCOUNTER — Ambulatory Visit (INDEPENDENT_AMBULATORY_CARE_PROVIDER_SITE_OTHER)
Admission: RE | Admit: 2015-11-11 | Discharge: 2015-11-11 | Disposition: A | Discharge status: Home / Self Care | Payer: Medicare Other | Source: Ambulatory Visit | Attending: Internal Medicine | Admitting: Internal Medicine

## 2015-11-11 DIAGNOSIS — Z79899 Other long term (current) drug therapy: Secondary | ICD-10-CM

## 2015-11-11 DIAGNOSIS — J9611 Chronic respiratory failure with hypoxia: Secondary | ICD-10-CM | POA: Diagnosis not present

## 2015-11-11 NOTE — Telephone Encounter (Signed)
Please advise Dr Chase Caller on HRCT results.  Received call report from [place] at [facility].

## 2015-11-12 NOTE — Telephone Encounter (Signed)
  1. [name] know that here is a new 4cm mass in Right upper lobe of lung 2. Have him do PET scan next few to several days 3. Please get super d of the ct chest from 11/11/15 and leave on my [place] with me or RB or TP within few of days of doing PET scan     Ct Chest High Resolution  Result Date: 11/11/2015 CLINICAL DATA:  Follow-up interstitial lung disease. Chronic respiratory failure with hypoxia. Rheumatoid arthritis. EXAM: CT CHEST WITHOUT CONTRAST TECHNIQUE: Multidetector CT imaging of the chest was performed following the standard protocol without intravenous contrast. High resolution imaging of the lungs, as well as inspiratory and expiratory imaging, was performed. COMPARISON:  12/29/2014 high-resolution chest CT. FINDINGS: Mediastinum/Nodes: Normal heart size. No significant pericardial fluid/thickening. Left anterior descending coronary atherosclerosis. Atherosclerotic nonaneurysmal thoracic aorta. Normal caliber pulmonary arteries. No discrete thyroid nodules. Unremarkable esophagus. No axillary adenopathy. Stable mild right paratracheal lymphadenopathy measuring up to 1.4 cm (series 7/image 48). Stable mild AP window lymphadenopathy measuring up to 1.2 cm (series 2/ image 46). Stable mildly enlarged 1.1 cm subcarinal node (series 7/ image 72). No new pathologically enlarged mediastinal or gross hilar nodes on this noncontrast study. Lungs/Pleura: No pneumothorax. No pleural effusion. There is peripheral basilar predominant subpleural reticulation and traction bronchiectasis throughout both lungs. There is severe honeycombing throughout both lungs, largely replacing the bilateral lower lobes, right middle lobe and lingula with lesser involvement of the upper lobes. Findings have not appreciably changed in the interval, although have significantly progressed compared to the oldest chest CT from 12/19/2007. There is a new 4.1 x 2.9 cm masslike focus of consolidation in the basilar  right upper lobe (series 8/ image 54). No additional new foci of consolidation or significant pulmonary nodules. Upper abdomen: Hypodense 0.6 cm right liver lesion (Series 7/ image 112), too small to characterize, not definitely seen on prior CT studies. Partially visualize simple appearing 5.9 cm upper right renal cyst. Stable thickening of the adrenal glands without discrete adrenal nodule. Musculoskeletal: No aggressive appearing focal osseous lesions. Marked thoracic spondylosis. Partially visualized surgical hardware from ACDF in the lower cervical spine. IMPRESSION: 1. New masslike focus of consolidation in the basilar right upper lobe measuring 4.1 x 2.9 cm, indeterminate. Differential includes neoplasm or infection. Consider one of the following in 3 months for both low-risk and high-risk individuals: (a) repeat chest CT, (b) follow-up PET-CT, or (c) tissue sampling. This recommendation follows the consensus statement: Guidelines for Management of Incidental Pulmonary Nodules Detected on CT Images:From the Fleischner Society 2017; published online before print (10.1148/radiol.[id number]). 2. 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 3. Mild mediastinal lymphadenopathy, not appreciably changed, nonspecific. 4. Aortic atherosclerosis.  One vessel coronary atherosclerosis. These results will be called to the ordering clinician or representative by the Radiologist Assistant, and communication documented in the PACS or zVision Dashboard. Electronically Signed   By: Ilona Sorrel M.D.   On: 11/11/2015 15:54

## 2015-11-14 ENCOUNTER — Telehealth: Payer: Self-pay | Admitting: Internal Medicine

## 2015-11-14 DIAGNOSIS — R918 Other nonspecific abnormal finding of lung field: Secondary | ICD-10-CM

## 2015-11-14 NOTE — Telephone Encounter (Signed)
Pt is scheduled for PET on 11/21/15 at 6:30 AM. Rb is full that day and TP not in the office. MR, do you want to work into your schedule? thanks

## 2015-11-14 NOTE — Telephone Encounter (Signed)
Pt is scheduled for PET 11/18/15 arrival '@6'$ :30 am [facility]; pt aware of appt & location info as well as to be NPO, including meds, after midnight prior evening.

## 2015-11-14 NOTE — Telephone Encounter (Signed)
Note     1. Let Glade Lloyd know that here is a new 4cm mass in Right upper lobe of lung 2. Have him do PET scan next few to several days 3. Please get super d of the ct chest from 11/11/15 and leave on my [place] with me or RB or TP within few of days of doing PET scan     ---- Note    lmtcb X1 for pt to relay recs.  Will order PET and schedule rov after speaking to pt.  Spoke with Lattie Haw at [facility], she is sending super-D disk to office at [facility].  Since message is closed, will hold in triage.    -----   Spoke with pt and is aware of results. PET ordered. Please advise PCC's once scheduled so we can make f/u appt thanks

## 2015-11-14 NOTE — Telephone Encounter (Signed)
See phone note 11/14/15 

## 2015-11-14 NOTE — Telephone Encounter (Signed)
lmtcb X1 for pt to relay recs.  Will order PET and schedule rov after speaking to pt.  Spoke with Lattie Haw at [facility], she is sending super-D disk to office at [facility].  Since message is closed, will hold in triage.

## 2015-11-16 ENCOUNTER — Encounter: Payer: Self-pay | Admitting: Family Medicine

## 2015-11-16 NOTE — Telephone Encounter (Signed)
MR please advise thanks  

## 2015-11-17 NOTE — Telephone Encounter (Signed)
[name]  You have the super-D. I will leave in your court to decide ENB. [name] is copied  Thanks  Dr. Brand Males, M.D., [facility].C.P Pulmonary and Critical Care Medicine Staff Physician [place] [facility] Pager: [phone], If no answer or between  15:00h - 7:00h: call [phone]  11/17/2015 4:14 PM

## 2015-11-18 ENCOUNTER — Ambulatory Visit (HOSPITAL_COMMUNITY)
Admission: RE | Admit: 2015-11-18 | Discharge: 2015-11-18 | Disposition: A | Discharge status: Home / Self Care | Payer: Medicare Other | Source: Ambulatory Visit | Attending: Internal Medicine | Admitting: Internal Medicine

## 2015-11-18 DIAGNOSIS — J9811 Atelectasis: Secondary | ICD-10-CM | POA: Insufficient documentation

## 2015-11-18 DIAGNOSIS — R918 Other nonspecific abnormal finding of lung field: Secondary | ICD-10-CM | POA: Diagnosis present

## 2015-11-18 DIAGNOSIS — N200 Calculus of kidney: Secondary | ICD-10-CM | POA: Insufficient documentation

## 2015-11-18 DIAGNOSIS — R590 Localized enlarged lymph nodes: Secondary | ICD-10-CM | POA: Insufficient documentation

## 2015-11-18 DIAGNOSIS — M47816 Spondylosis without myelopathy or radiculopathy, lumbar region: Secondary | ICD-10-CM | POA: Diagnosis not present

## 2015-11-18 DIAGNOSIS — M4328 Fusion of spine, sacral and sacrococcygeal region: Secondary | ICD-10-CM | POA: Insufficient documentation

## 2015-11-18 DIAGNOSIS — J439 Emphysema, unspecified: Secondary | ICD-10-CM | POA: Diagnosis not present

## 2015-11-18 DIAGNOSIS — I700 Atherosclerosis of aorta: Secondary | ICD-10-CM | POA: Diagnosis not present

## 2015-11-18 DIAGNOSIS — M5136 Other intervertebral disc degeneration, lumbar region: Secondary | ICD-10-CM | POA: Diagnosis not present

## 2015-11-18 LAB — GLUCOSE, CAPILLARY: Glucose-Capillary: 122 mg/dL — ABNORMAL HIGH (ref 65–99)

## 2015-11-18 MED ORDER — FLUDEOXYGLUCOSE F - 18 (FDG) INJECTION
9.7400 | Freq: Once | INTRAVENOUS | Status: AC | PRN
  Administered 2015-11-18: 9.74 via INTRAVENOUS

## 2015-11-18 NOTE — Telephone Encounter (Signed)
I have reviewed the scans. He has a RUL peripheral mass, mediastinal hypermetabolic nodes. The mass is in an area of em[physema / scarring. Could navigate to it but I believe tis carries some significant risk. Based on the hypermetabolic nodes, I believe highest yield approach will be EBUS for nodal cytology. If unrevealing then would consult with IR about RUL nodule biopsy.   I will be out of town in early September, will consult with Dr Ashok Cordia to see if we can arrange for a procedure for this gentleman, then arrange through the office and communicate to the patient.

## 2015-11-18 NOTE — Telephone Encounter (Signed)
I think we could safely do an EBUS on him just from reviewing his records. I will ask Mindy to open up a 9am spot next Friday to meet with him to discuss the procedure in person.

## 2015-11-21 ENCOUNTER — Telehealth: Payer: Self-pay | Admitting: Internal Medicine

## 2015-11-21 NOTE — Telephone Encounter (Signed)
Mindy. Can you flag this for me so I do not overlook his immunocompromised state. This will mean his EBUS needs done in negative pressure w/ TB precautions. Thanks.

## 2015-11-21 NOTE — Telephone Encounter (Signed)
I have sent an email to keith/Tammy getting approval for add on. Will await response

## 2015-11-21 NOTE — Telephone Encounter (Signed)
Appointment has been scheduled for 11/25/15 at 9 AM with Dr. Ashok Cordia. Pt is aware.

## 2015-11-21 NOTE — Telephone Encounter (Signed)
Creig Hines: thanks for working this guy in. Please note immunocmprosmised stage and pleas send bal for cell count and opprtunistic pathogens incluing DNA probe for TB  Thanks  Dr. Brand Males, M.D., [facility].C.P Pulmonary and Critical Care Medicine Staff Physician [place] [facility] Pager: [phone], If no answer or between  15:00h - 7:00h: call [phone]  11/21/2015 11:29 AM

## 2015-11-22 NOTE — Telephone Encounter (Signed)
Will hold open for patient appt on Friday. Will close after appointment

## 2015-11-24 ENCOUNTER — Other Ambulatory Visit: Payer: Self-pay | Admitting: Family Medicine

## 2015-11-25 ENCOUNTER — Ambulatory Visit (INDEPENDENT_AMBULATORY_CARE_PROVIDER_SITE_OTHER): Payer: Medicare Other | Admitting: Pulmonary Disease

## 2015-11-25 ENCOUNTER — Encounter: Payer: Self-pay | Admitting: Pulmonary Disease

## 2015-11-25 VITALS — BP 136/86 | HR 94 | Ht 69.6 in | Wt 198.0 lb

## 2015-11-25 DIAGNOSIS — R918 Other nonspecific abnormal finding of lung field: Secondary | ICD-10-CM

## 2015-11-25 DIAGNOSIS — J9611 Chronic respiratory failure with hypoxia: Secondary | ICD-10-CM

## 2015-11-25 DIAGNOSIS — J849 Interstitial pulmonary disease, unspecified: Secondary | ICD-10-CM | POA: Diagnosis not present

## 2015-11-25 NOTE — Progress Notes (Signed)
Subjective:    Patient ID: [name], male    DOB: 18-Nov-1950, 65 y.o.   MRN: [id number]  C.C.:  Follow-up for RUL Mass, Post Inflammatory ILD, & Chronic Hypoxic Respiratory Failure.  HPI RUL Mass:  Hypermetabolic on PET CT imaging 11/18/15. There are some right paratracheal nodules that are hypermetabolic as well but not pathologically enlarged. Has a prior history of lung nodules going back to 2012 that were seen on PET CT in his right lung and were metabolically active. No adenopathy in his neck, groin, or axilla. No dysphagia or odynophagia. No headaches. No focal vision loss, weakness, numbness, or tingling.   Post-Inflammatory ILD:  Has known underlying RA. Currently on Cellcept. Has UIP pattern. He is on Bactrim DS 3 times weekly.  He feels like his dyspnea is progressively worsening. Denies any significant cough. He reports he rarely uses his rescue inhaler or nebulizer and report that don't seem to alleviate his dyspnea at all.   Chronic Hypoxic Respiratory Failure:  Patient was saturating 70% on 2L/m pulse and only 70% on 4L/m pulse. When increased to 4 L/m continuous flow the patient's oxygen saturation increased to 90%.  Review of Systems  Denies any fever, chills, or sweats. No nausea, emesis or diarrhea. No chest pain, tightness, or pressure. No syncope or near syncope.    No Known Allergies  Current Outpatient Prescriptions on File Prior to Visit  Medication Sig Dispense Refill  . albuterol (VENTOLIN HFA) 108 (90 BASE) MCG/ACT inhaler Inhale 2 puffs into the lungs every 4 (four) hours as needed for wheezing or shortness of breath. 8.5 each 11  . aspirin 81 MG tablet Take 81 mg by mouth daily.    Marland Kitchen buPROPion (WELLBUTRIN XL) 150 MG 24 hr tablet Take 1 tab po qd, then increase to 2 tabs in the morning in 2 weeks if needed. 60 tablet 5  . gabapentin (NEURONTIN) 400 MG capsule Take 2 capsules (800 mg total) by mouth 3 (three) times daily. 540 capsule 5  . ipratropium-albuterol  (DUONEB) 0.5-2.5 (3) MG/3ML SOLN TAKE 3 MLS BY NEBULIZATION EVERY 4 (FOUR) HOURS AS NEEDED. 360 mL 3  . losartan (COZAAR) 50 MG tablet TAKE 1 TABLET BY MOUTH EVERY DAY FOR BLOOD PRESSURE 90 tablet 0  . meloxicam (MOBIC) 7.5 MG tablet Take 1 tablet (7.5 mg total) by mouth daily as needed for pain. Do not use with any other otc pain medication other than acetaminophen 90 tablet 0  . metFORMIN (GLUCOPHAGE) 500 MG tablet TAKE 1 TABLET (500 MG TOTAL) BY MOUTH 2 (TWO) TIMES DAILY WITH A MEAL. FOR DIABETES 180 tablet 0  . mycophenolate (CELLCEPT) 500 MG tablet TAKE 2 TABLETS TWICE DAILY 1 HOUR BEFORE OR 2 HOURS  AFTER  MEAL 120 tablet 5  . pravastatin (PRAVACHOL) 40 MG tablet TAKE 1 TABLET (40 MG TOTAL) BY MOUTH AT BEDTIME. FOR CHOLESTEROL 90 tablet 0  . sulfamethoxazole-trimethoprim (BACTRIM DS,SEPTRA DS) 800-160 MG tablet TAKE 1 TABLET BY MOUTH THREE TIMES A WEEK (NEED APPT FOR MORE REFILLS!!) 12 tablet 2  . ONE TOUCH ULTRA TEST test strip TEST BLOOD SUGAR DAILY. DX CODE: E11.65 100 each 0  . zoster vaccine live, PF, (ZOSTAVAX) 01093 UNT/0.65ML injection Inject 19,400 Units into the skin once. (Patient not taking: Reported on 11/25/2015) 1 vial 0   No current facility-administered medications on file prior to visit.     Past Medical History:  Diagnosis Date  . Diabetes mellitus without complication ([place])   . Dyspnea   .  GERD (gastroesophageal reflux disease)   . Hypertension   . Pulmonary fibrosis ([place])   . Rheumatoid arthritis(714.0) 2005    Past Surgical History:  Procedure Laterality Date  . Excision mass R lateral arm 2010  2010  . EYE SURGERY    . MASS EXCISION  03/08/2011   Procedure: EXCISION MASS;  Surgeon: Cammie Sickle., MD;  Location: [facility];  Service: Orthopedics;  Laterality: Right;  excision rheumatoid nodule right ulna  . NECK SURGERY      Family History  Problem Relation Age of Onset  . Cancer Father   . Rheum arthritis Sister   . Lung disease Neg  Hx     Social History   Social History  . Marital status: Single    Spouse name: N/A  . Number of children: N/A  . Years of education: N/A   Occupational History  . retired   .  Retired   Social History Main Topics  . Smoking status: Former Smoker    Packs/day: 0.50    Years: 39.00    Start date: 02/27/1969    Quit date: 01/25/2008  . Smokeless tobacco: Never Used  . Alcohol use No  . Drug use: No  . Sexual activity: No   Other Topics Concern  . None   Social History Narrative   Single   Exercise: NO       Pulmonary:   Originally from [place]. Has previously lived in [place]. He attended school in [place]. He served in [place]. 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 Has a cat at home. No bird exposure. No mold exposure.          Objective:   Physical Exam BP 136/86 (BP Location: Left Arm, Cuff Size: Normal)   Pulse 94   Ht 5' 9.6" (1.768 m)   Wt 198 lb (89.8 kg)   SpO2 90%   BMI 28.74 kg/m  General:  No distress. Awake. Alert.  HEENT:  Moist mucus membranes. No oral ulcers. No scleral icterus.  Lymphatics:  No appreciated cervical or supraclavicular lymphadenopathy.  Pulmonary:  Bilateral diffuse crackles with some squeaks. Normal work of breathing on oxygen at rest. Significant dyspnea with mouth breathing after walking 1 lap in office. Cardiovascular:  Regular rate. No edema. No appreciable JVD. Abdominal:  Soft. Protuberant. No mass appreciated. Nontender. Neurological:  CN 2-12 grossly in tact. No meningismus.   PFT 04/11/15: FVC 2.62 L (59%) FEV1 2.36 L (71%) FEV1/FVC 0.90 FEF 25-75 4.27 L (165%) no bronchodilator response TLC 3.54 L (52%) RV 50% ERV 37% DLCO uncorrected 37%  Walk Test 11/25/15:  Walked 1 lap / Baseline Sat 94% on 4 L/m at rest / Nadir Sat 91% on 4 L/m  IMAGING PET CT 11/18/15 (personally reviewed by me):  Right upper lobe nodule with max SUV 10.8. Maximal  dimension 4.1cm. Clustered right paratracheal nodes largest measuring 0.9cm in short axis with maximum uptake 5.8. No other hypermetabolic foci.  No pleural effusion or thickening. No pericardial effusion.   LABS 11/10/15 CBC:  12.3/14.2/42.8/356 BMP:  139/4.5/104/20/21/1.07/87/9.5 LFT:  4.1/7.7/0.4/104/15/14    Assessment & Plan:  65 y.o. male with hypermetabolic RUL mass and mediastinal adenopathy. Patient's oxygen requirement has significantly increased today to 4 L/m. I reviewed his PET CT at length with him explaining that there is a risk this represents cancer but could also represent an inflammatory process such  as RA or even an underlying infection. However, he has no symptoms to suggest an infection. We discussed bronchoscopy as well as the risks with biopsy and endotracheal intubation which I feel would be necessary to safely perform the procedure with his degree of hypoxia. The patient is not willing to accept the risks of the procedure. He does wish to know if this is a malignancy. As such I discussed the risks of CT-guided lung biopsy briefly with him including bleeding, infection & pneumothorax. He is willing to undergo the procedure. We did discuss the fact he would be a poor surgical candidate and any treatment would likely be a combination of chemotherapy/radiation therapy. I instructed him to contact our office if he had any further questions or concerns before his next appointment.   1. Hypermetabolic RUL Nodule:  Referring patient for CT-guided biopsy with cytology/pathology, Respiratory Culture, AFB culture, and Fungal culture.  2. 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 Patient continuing on his current dose of Cellcept. Already scheduled for pulmonary function testing at follow-up. Will defer to Dr. Chase Caller in adjusting his immunosuppression but discontinuing the medication will need to be discussed depending on the final pathology & culture reports.   3. Chronic Hypoxic Respiratory Failure:  Continuing the patient on oxygen at 4 L/m at rest & with exertion to maintain his saturation.  4. Follow-up:  Patient to return to clinic on 9/18 as scheduled to see Dr. Chase Caller.   Sonia Baller Ashok Cordia, M.D. [facility] Pager:  [phone] After 3pm or if no response, call [phone] 10:06 AM 11/25/15

## 2015-11-25 NOTE — Patient Instructions (Addendum)
   Continue using your medications as prescribed.  We are increasing your oxygen to 4 L/m continuous flow at rest and with any exertion/walking.   We will set you up an appointment to see the Interventional Radiologist to discuss and plan out your lung biopsy to determine if you mass is cancer.  Please call our office if you have any questions or concerns before your next appointment.  We will keep your appointments on 9/18 in our office as they are currently scheduled.  TESTS ORDERED: 1. CT-Guided Right Upper Lobe biopsy of mass - Send for cytology, pathology, routine culture, AFB smear/culture, & Fungal smear/culture.

## 2015-11-29 ENCOUNTER — Telehealth: Payer: Self-pay | Admitting: Internal Medicine

## 2015-11-29 NOTE — Telephone Encounter (Signed)
atc Kim at [facility] to make aware of below documentation, but office is closed for lunch.  Will call back.

## 2015-11-29 NOTE — Telephone Encounter (Signed)
Also this is documented in the OV note.

## 2015-11-29 NOTE — Telephone Encounter (Signed)
Please see the patient care coordination note where the sats where documented. thanks

## 2015-11-29 NOTE — Telephone Encounter (Signed)
Spoke with Maudie Mercury at [facility], states that pt needs 02 sats put in Epic and an order stating that he needs to be switched back to portable and stationary tanks on 4lpm 24/7.  Pt had his previous tanks received through [facility] picked up over 1 year ago, needs qualifying sats for these tanks to be delivered.  I do not see qualifying sats in ov note.  Mindy please advise if pt was walked in office visit, or if he needs to return to clinic for qualifying walk.  Thanks!

## 2015-11-30 NOTE — Telephone Encounter (Signed)
Spoke with [name] at [facility]. She was made aware of where to find the documentation she is needing. States that she will call back if she needs anything further.

## 2015-12-01 ENCOUNTER — Other Ambulatory Visit: Payer: Self-pay | Admitting: General Surgery

## 2015-12-05 ENCOUNTER — Ambulatory Visit (HOSPITAL_COMMUNITY)
Admission: RE | Admit: 2015-12-05 | Discharge: 2015-12-05 | Disposition: A | Discharge status: Home / Self Care | Payer: Medicare Other | Source: Ambulatory Visit | Attending: Pulmonary Disease | Admitting: Pulmonary Disease

## 2015-12-05 ENCOUNTER — Encounter (HOSPITAL_COMMUNITY): Payer: Self-pay

## 2015-12-05 ENCOUNTER — Other Ambulatory Visit (HOSPITAL_COMMUNITY): Payer: Self-pay | Admitting: Interventional Radiology

## 2015-12-05 ENCOUNTER — Inpatient Hospital Stay (HOSPITAL_COMMUNITY)
Admission: AD | Admit: 2015-12-05 | Discharge: 2015-12-08 | DRG: 200 | Disposition: A | Discharge status: Home / Self Care | Payer: Medicare Other | Source: Ambulatory Visit | Attending: Pulmonary Disease | Admitting: Pulmonary Disease

## 2015-12-05 DIAGNOSIS — J939 Pneumothorax, unspecified: Secondary | ICD-10-CM

## 2015-12-05 DIAGNOSIS — Z7982 Long term (current) use of aspirin: Secondary | ICD-10-CM | POA: Diagnosis not present

## 2015-12-05 DIAGNOSIS — E1165 Type 2 diabetes mellitus with hyperglycemia: Secondary | ICD-10-CM | POA: Diagnosis present

## 2015-12-05 DIAGNOSIS — Z23 Encounter for immunization: Secondary | ICD-10-CM | POA: Diagnosis not present

## 2015-12-05 DIAGNOSIS — E785 Hyperlipidemia, unspecified: Secondary | ICD-10-CM | POA: Diagnosis present

## 2015-12-05 DIAGNOSIS — I10 Essential (primary) hypertension: Secondary | ICD-10-CM | POA: Diagnosis present

## 2015-12-05 DIAGNOSIS — J9611 Chronic respiratory failure with hypoxia: Secondary | ICD-10-CM | POA: Diagnosis present

## 2015-12-05 DIAGNOSIS — J84112 Idiopathic pulmonary fibrosis: Secondary | ICD-10-CM | POA: Diagnosis present

## 2015-12-05 DIAGNOSIS — Z87891 Personal history of nicotine dependence: Secondary | ICD-10-CM

## 2015-12-05 DIAGNOSIS — J9690 Respiratory failure, unspecified, unspecified whether with hypoxia or hypercapnia: Secondary | ICD-10-CM

## 2015-12-05 DIAGNOSIS — R911 Solitary pulmonary nodule: Secondary | ICD-10-CM | POA: Diagnosis present

## 2015-12-05 DIAGNOSIS — S270XXA Traumatic pneumothorax, initial encounter: Secondary | ICD-10-CM

## 2015-12-05 DIAGNOSIS — R0902 Hypoxemia: Secondary | ICD-10-CM | POA: Diagnosis not present

## 2015-12-05 DIAGNOSIS — Z938 Other artificial opening status: Secondary | ICD-10-CM | POA: Diagnosis not present

## 2015-12-05 DIAGNOSIS — Z79899 Other long term (current) drug therapy: Secondary | ICD-10-CM | POA: Diagnosis not present

## 2015-12-05 DIAGNOSIS — J95811 Postprocedural pneumothorax: Principal | ICD-10-CM | POA: Diagnosis present

## 2015-12-05 DIAGNOSIS — S270XXD Traumatic pneumothorax, subsequent encounter: Secondary | ICD-10-CM | POA: Diagnosis not present

## 2015-12-05 DIAGNOSIS — R918 Other nonspecific abnormal finding of lung field: Secondary | ICD-10-CM

## 2015-12-05 DIAGNOSIS — Z7984 Long term (current) use of oral hypoglycemic drugs: Secondary | ICD-10-CM | POA: Diagnosis not present

## 2015-12-05 DIAGNOSIS — M069 Rheumatoid arthritis, unspecified: Secondary | ICD-10-CM | POA: Diagnosis present

## 2015-12-05 DIAGNOSIS — Z9889 Other specified postprocedural states: Secondary | ICD-10-CM

## 2015-12-05 LAB — GLUCOSE, CAPILLARY
GLUCOSE-CAPILLARY: 154 mg/dL — AB (ref 65–99)
GLUCOSE-CAPILLARY: 179 mg/dL — AB (ref 65–99)
Glucose-Capillary: 223 mg/dL — ABNORMAL HIGH (ref 65–99)
Glucose-Capillary: 134 mg/dL — ABNORMAL HIGH (ref 65–99)

## 2015-12-05 LAB — CBC
HCT: 43.4 % (ref 39.0–52.0)
Hemoglobin: 14 g/dL (ref 13.0–17.0)
MCH: 30 pg (ref 26.0–34.0)
MCHC: 32.3 g/dL (ref 30.0–36.0)
MCV: 93.1 fL (ref 78.0–100.0)
PLATELETS: 348 10*3/uL (ref 150–400)
RBC: 4.66 MIL/uL (ref 4.22–5.81)
RDW: 13.4 % (ref 11.5–15.5)
WBC: 12.4 10*3/uL — AB (ref 4.0–10.5)

## 2015-12-05 LAB — APTT: APTT: 28 s (ref 24–36)

## 2015-12-05 LAB — MRSA PCR SCREENING: MRSA BY PCR: NEGATIVE

## 2015-12-05 LAB — PROTIME-INR
INR: 1.01
Prothrombin Time: 13.3 seconds (ref 11.4–15.2)

## 2015-12-05 MED ORDER — LOSARTAN POTASSIUM 50 MG PO TABS
50.0000 mg | ORAL_TABLET | Freq: Every day | ORAL | Status: DC
  Administered 2015-12-05 – 2015-12-08 (×4): 50 mg via ORAL
  Filled 2015-12-05 (×4): qty 1

## 2015-12-05 MED ORDER — LOSARTAN POTASSIUM 50 MG PO TABS
50.0000 mg | ORAL_TABLET | Freq: Once | ORAL | Status: AC
  Administered 2015-12-05: 50 mg via ORAL
  Filled 2015-12-05: qty 1

## 2015-12-05 MED ORDER — MYCOPHENOLATE MOFETIL 250 MG PO CAPS: 500.0000 mg | ORAL_CAPSULE | Freq: Two times a day (BID) | ORAL | Status: DC

## 2015-12-05 MED ORDER — MIDAZOLAM HCL 2 MG/2ML IJ SOLN
INTRAMUSCULAR | Status: AC
  Filled 2015-12-05: qty 4

## 2015-12-05 MED ORDER — GABAPENTIN 400 MG PO CAPS
800.0000 mg | ORAL_CAPSULE | Freq: Three times a day (TID) | ORAL | Status: DC
  Administered 2015-12-05 – 2015-12-08 (×9): 800 mg via ORAL
  Filled 2015-12-05 (×5): qty 2
  Filled 2015-12-05: qty 8
  Filled 2015-12-05 (×3): qty 2

## 2015-12-05 MED ORDER — SODIUM CHLORIDE 0.9 % IV SOLN
INTRAVENOUS | Status: AC | PRN
  Administered 2015-12-05: 10 mL/h via INTRAVENOUS

## 2015-12-05 MED ORDER — SODIUM CHLORIDE 0.9 % IV SOLN: INTRAVENOUS | Status: DC

## 2015-12-05 MED ORDER — PRAVASTATIN SODIUM 40 MG PO TABS
40.0000 mg | ORAL_TABLET | Freq: Every day | ORAL | Status: DC
  Administered 2015-12-05 – 2015-12-07 (×3): 40 mg via ORAL
  Filled 2015-12-05 (×3): qty 1

## 2015-12-05 MED ORDER — MIDAZOLAM HCL 2 MG/2ML IJ SOLN
INTRAMUSCULAR | Status: AC | PRN
  Administered 2015-12-05 (×2): 0.5 mg via INTRAVENOUS

## 2015-12-05 MED ORDER — ZOLPIDEM TARTRATE 5 MG PO TABS
5.0000 mg | ORAL_TABLET | Freq: Once | ORAL | Status: AC
  Administered 2015-12-05: 5 mg via ORAL
  Filled 2015-12-05: qty 1

## 2015-12-05 MED ORDER — FENTANYL CITRATE (PF) 100 MCG/2ML IJ SOLN
25.0000 ug | INTRAMUSCULAR | Status: DC | PRN
  Administered 2015-12-05: 25 ug via INTRAVENOUS
  Filled 2015-12-05: qty 2

## 2015-12-05 MED ORDER — ASPIRIN EC 81 MG PO TBEC
81.0000 mg | DELAYED_RELEASE_TABLET | Freq: Every day | ORAL | Status: DC
  Administered 2015-12-05 – 2015-12-08 (×4): 81 mg via ORAL
  Filled 2015-12-05 (×4): qty 1

## 2015-12-05 MED ORDER — SODIUM CHLORIDE 0.9 % IV SOLN: 250.0000 mL | INTRAVENOUS | Status: DC | PRN

## 2015-12-05 MED ORDER — IPRATROPIUM-ALBUTEROL 0.5-2.5 (3) MG/3ML IN SOLN
3.0000 mL | Freq: Four times a day (QID) | RESPIRATORY_TRACT | Status: DC
  Administered 2015-12-05 – 2015-12-06 (×3): 3 mL via RESPIRATORY_TRACT
  Filled 2015-12-05 (×4): qty 3

## 2015-12-05 MED ORDER — FENTANYL CITRATE (PF) 100 MCG/2ML IJ SOLN
INTRAMUSCULAR | Status: AC | PRN
  Administered 2015-12-05: 25 ug via INTRAVENOUS

## 2015-12-05 MED ORDER — BUPROPION HCL ER (XL) 300 MG PO TB24
300.0000 mg | ORAL_TABLET | Freq: Every day | ORAL | Status: DC
  Administered 2015-12-05 – 2015-12-08 (×4): 300 mg via ORAL
  Filled 2015-12-05 (×4): qty 1

## 2015-12-05 MED ORDER — METFORMIN HCL 500 MG PO TABS
500.0000 mg | ORAL_TABLET | Freq: Two times a day (BID) | ORAL | Status: DC
  Administered 2015-12-05 – 2015-12-08 (×6): 500 mg via ORAL
  Filled 2015-12-05 (×6): qty 1

## 2015-12-05 MED ORDER — SULFAMETHOXAZOLE-TRIMETHOPRIM 800-160 MG PO TABS
1.0000 | ORAL_TABLET | ORAL | Status: DC
  Administered 2015-12-05 – 2015-12-07 (×2): 1 via ORAL
  Filled 2015-12-05 (×2): qty 1

## 2015-12-05 MED ORDER — MYCOPHENOLATE MOFETIL 250 MG PO CAPS
1000.0000 mg | ORAL_CAPSULE | Freq: Two times a day (BID) | ORAL | Status: DC
  Administered 2015-12-05 – 2015-12-08 (×6): 1000 mg via ORAL
  Filled 2015-12-05 (×7): qty 4

## 2015-12-05 MED ORDER — INSULIN ASPART 100 UNIT/ML ~~LOC~~ SOLN
0.0000 [IU] | Freq: Three times a day (TID) | SUBCUTANEOUS | Status: DC
  Administered 2015-12-05: 2 [IU] via SUBCUTANEOUS
  Administered 2015-12-06: 1 [IU] via SUBCUTANEOUS
  Administered 2015-12-06: 2 [IU] via SUBCUTANEOUS
  Administered 2015-12-06 – 2015-12-07 (×2): 1 [IU] via SUBCUTANEOUS
  Administered 2015-12-07: 2 [IU] via SUBCUTANEOUS
  Administered 2015-12-08: 1 [IU] via SUBCUTANEOUS

## 2015-12-05 MED ORDER — ALBUTEROL SULFATE HFA 108 (90 BASE) MCG/ACT IN AERS: 2.0000 | INHALATION_SPRAY | RESPIRATORY_TRACT | Status: DC | PRN

## 2015-12-05 MED ORDER — PNEUMOCOCCAL VAC POLYVALENT 25 MCG/0.5ML IJ INJ
0.5000 mL | INJECTION | INTRAMUSCULAR | Status: AC
  Administered 2015-12-06: 0.5 mL via INTRAMUSCULAR
  Filled 2015-12-05: qty 0.5

## 2015-12-05 MED ORDER — ALBUTEROL SULFATE (2.5 MG/3ML) 0.083% IN NEBU: 2.5000 mg | INHALATION_SOLUTION | RESPIRATORY_TRACT | Status: DC | PRN

## 2015-12-05 MED ORDER — FENTANYL CITRATE (PF) 100 MCG/2ML IJ SOLN
INTRAMUSCULAR | Status: AC
  Filled 2015-12-05: qty 4

## 2015-12-05 MED ORDER — LIDOCAINE-EPINEPHRINE 1 %-1:100000 IJ SOLN
INTRAMUSCULAR | Status: AC
  Filled 2015-12-05: qty 1

## 2015-12-05 MED ORDER — ASPIRIN 81 MG PO TABS: 81.0000 mg | ORAL_TABLET | Freq: Every day | ORAL | Status: DC

## 2015-12-05 MED ORDER — MYCOPHENOLATE MOFETIL 500 MG PO TABS: 500.0000 mg | ORAL_TABLET | Freq: Two times a day (BID) | ORAL | Status: DC

## 2015-12-05 MED ORDER — HEPARIN SODIUM (PORCINE) 5000 UNIT/ML IJ SOLN
5000.0000 [IU] | Freq: Three times a day (TID) | INTRAMUSCULAR | Status: DC
  Administered 2015-12-05 – 2015-12-07 (×7): 5000 [IU] via SUBCUTANEOUS
  Filled 2015-12-05 (×8): qty 1

## 2015-12-05 MED ORDER — INSULIN ASPART 100 UNIT/ML ~~LOC~~ SOLN: 0.0000 [IU] | Freq: Every day | SUBCUTANEOUS | Status: DC

## 2015-12-05 NOTE — H&P (Signed)
Chief Complaint: RUL mass  Referring Physician(s): Nestor,Jennings E  Supervising Physician: Sandi Mariscal  Patient Status: Outpatient  History of Present Illness: [name] is a 65 y.o. male with pulmonary fibrosis and RUL mass who is here today for CT guided biopsy.  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  He was initially seeing Dr. Wynn Maudlin at [facility] but he had not seen him since 2013.  He presented to the pulmonary office with cough and shortness of breath on 11/09/2015.    O2 Saturation was 70% so he was placed on O2 at 4 liters via [place]. Now his Sats are around 90%  He does use inhalers and Nebs.   CT scan was obtained which revealed peripheral basilar predominant subpleural reticulation and traction bronchiectasis throughout both lungs. Severe honeycombing throughout both lungs, largely replacing the bilateral lower lobes, right middle lobe and lingula with lesser involvement of the upper lobes. Findings have not appreciably changed in the interval, although have significantly progressed compared to the oldest chest CT from 12/19/2007. There is a new 4.1 x 2.9 cm masslike focus of consolidation in the basilar right upper lobe.  PET scan revealed airspace opacity anteriorly in the right upper lobe has a maximum standard uptake value of 10.8. The hypermetabolic portion measures about 2.0 by 1.7 cm and there appears to be some atelectasis distal to this lesion. There is some clustered right lower paratracheal lymph nodes, the larger of which measures 0.9 cm in short axis, maximum standard uptake value 5.8. There are other small paratracheal and AP window lymph nodes the which are not overtly hypermetabolic. Cardiomegaly noted. Underlying paraseptal emphysema and honeycombing.  We are asked to perform a CT guided biopsy today.  He is NPO and he does not take blood thinners.  Past Medical History:    Diagnosis Date  . Diabetes mellitus without complication ([place])   . Dyspnea   . GERD (gastroesophageal reflux disease)   . Hypertension   . Pulmonary fibrosis ([place])   . Rheumatoid arthritis(714.0) 2005    Past Surgical History:  Procedure Laterality Date  . Excision mass R lateral arm 2010  2010  . EYE SURGERY    . MASS EXCISION  03/08/2011   Procedure: EXCISION MASS;  Surgeon: Cammie Sickle., MD;  Location: [facility];  Service: Orthopedics;  Laterality: Right;  excision rheumatoid nodule right ulna  . NECK SURGERY      Allergies: Review of patient's allergies indicates no known allergies.  Medications: Prior to Admission medications   Medication Sig Start Date End Date Taking? Authorizing Provider  albuterol (VENTOLIN HFA) 108 (90 BASE) MCG/ACT inhaler Inhale 2 puffs into the lungs every 4 (four) hours as needed for wheezing or shortness of breath. 11/20/13  Yes Shawnee Knapp, MD  aspirin 81 MG tablet Take 81 mg by mouth daily.   Yes Historical Provider, MD  buPROPion (WELLBUTRIN XL) 150 MG 24 hr tablet Take 1 tab po qd, then increase to 2 tabs in the morning in 2 weeks if needed. Patient taking differently: Take 300 mg by mouth daily.  11/10/15  Yes Shawnee Knapp, MD  gabapentin (NEURONTIN) 400 MG capsule Take 2 capsules (800 mg total) by mouth 3 (three) times daily. 03/16/15  Yes Shawnee Knapp, MD  ipratropium-albuterol (DUONEB) 0.5-2.5 (3) MG/3ML SOLN TAKE 3 MLS BY NEBULIZATION EVERY 4 (FOUR) HOURS AS NEEDED. 09/27/15  Yes Shawnee Knapp,  MD  losartan (COZAAR) 50 MG tablet TAKE 1 TABLET BY MOUTH EVERY DAY FOR BLOOD PRESSURE 11/25/15  Yes Shawnee Knapp, MD  meloxicam (MOBIC) 7.5 MG tablet Take 1 tablet (7.5 mg total) by mouth daily as needed for pain. Do not use with any other otc pain medication other than acetaminophen 03/18/15  Yes Shawnee Knapp, MD  metFORMIN (GLUCOPHAGE) 500 MG tablet TAKE 1 TABLET (500 MG TOTAL) BY MOUTH 2 (TWO) TIMES DAILY WITH A MEAL. FOR DIABETES 09/24/15  Yes  Shawnee Knapp, MD  mycophenolate (CELLCEPT) 500 MG tablet TAKE 2 TABLETS TWICE DAILY 1 HOUR BEFORE OR 2 HOURS  AFTER  MEAL 08/23/15  Yes Brand Males, MD  pravastatin (PRAVACHOL) 40 MG tablet TAKE 1 TABLET (40 MG TOTAL) BY MOUTH AT BEDTIME. FOR CHOLESTEROL 10/26/15  Yes Mancel Bale, PA-C  sulfamethoxazole-trimethoprim (BACTRIM DS,SEPTRA DS) 800-160 MG tablet TAKE 1 TABLET BY MOUTH THREE TIMES A WEEK (NEED APPT FOR MORE REFILLS!!) 10/24/15  Yes Brand Males, MD  ONE TOUCH ULTRA TEST test strip TEST BLOOD SUGAR DAILY. DX CODE: E11.65 05/29/15   Shawnee Knapp, MD     Family History  Problem Relation Age of Onset  . Cancer Father   . Rheum arthritis Sister   . Lung disease Neg Hx     Social History   Social History  . Marital status: Single    Spouse name: N/A  . Number of children: N/A  . Years of education: N/A   Occupational History  . retired   .  Retired   Social History Main Topics  . Smoking status: Former Smoker    Packs/day: 0.50    Years: 39.00    Start date: 02/27/1969    Quit date: 01/25/2008  . Smokeless tobacco: Never Used  . Alcohol use No  . Drug use: No  . Sexual activity: No   Other Topics Concern  . None   Social History Narrative   Single   Exercise: NO      [facility]:   Originally from [place]. Has previously lived in [place]. He attended school in [place]. He served in [place]. 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 Has a cat at home. No bird exposure. No mold exposure.        Review of Systems: A 12 point ROS discussed Review of Systems  Constitutional: Negative for activity change, appetite change, chills, fatigue and fever.  HENT: Negative.   Respiratory: Positive for cough, shortness of breath and wheezing.   Cardiovascular: Negative for chest pain.  Gastrointestinal: Negative for abdominal pain, nausea and vomiting.  Genitourinary: Negative.   Musculoskeletal:  Positive for arthralgias.  Skin: Negative.   Neurological: Negative.   Hematological: Negative.   Psychiatric/Behavioral: Negative.     Vital Signs: BP (!) 171/110   Pulse 81   Temp 97.6 F (36.4 C) (Oral)   Resp 20   Ht '5\' 9"'$  (1.753 m)   Wt 200 lb (90.7 kg)   SpO2 96%   BMI 29.53 kg/m   Physical Exam  Constitutional: He is oriented to person, place, and time. He appears well-developed and well-nourished.  HENT:  Head: Normocephalic and atraumatic.  Eyes: EOM are normal.  Neck: Normal range of motion.  Cardiovascular: Normal rate, regular rhythm and normal heart sounds.   No murmur heard. Pulmonary/Chest: Effort normal. He has wheezes.  + rhonchi on the right. Clear  on the left  Abdominal: Soft. He exhibits no distension. There is no tenderness.  Musculoskeletal: Normal range of motion.  Neurological: He is alert and oriented to person, place, and time.  Skin: Skin is warm and dry.  Psychiatric: He has a normal mood and affect. His behavior is normal. Judgment and thought content normal.  Vitals reviewed.   Mallampati Score:  MD Evaluation Airway: WNL Heart: WNL Abdomen: WNL Chest/ Lungs: Other (comments) Chest/ lungs comments: Coarse rhonchi and wheezes on the right. 4 liters O2 nasal cannula ASA  Classification: 3 Mallampati/Airway Score: One  Imaging: Ct Chest High Resolution  Result Date: 11/11/2015 CLINICAL DATA:  Follow-up interstitial lung disease. Chronic respiratory failure with hypoxia. Rheumatoid arthritis. EXAM: CT CHEST WITHOUT CONTRAST TECHNIQUE: Multidetector CT imaging of the chest was performed following the standard protocol without intravenous contrast. High resolution imaging of the lungs, as well as inspiratory and expiratory imaging, was performed. COMPARISON:  12/29/2014 high-resolution chest CT. FINDINGS: Mediastinum/Nodes: Normal heart size. No significant pericardial fluid/thickening. Left anterior descending coronary atherosclerosis.  Atherosclerotic nonaneurysmal thoracic aorta. Normal caliber pulmonary arteries. No discrete thyroid nodules. Unremarkable esophagus. No axillary adenopathy. Stable mild right paratracheal lymphadenopathy measuring up to 1.4 cm (series 7/image 48). Stable mild AP window lymphadenopathy measuring up to 1.2 cm (series 2/ image 46). Stable mildly enlarged 1.1 cm subcarinal node (series 7/ image 72). No new pathologically enlarged mediastinal or gross hilar nodes on this noncontrast study. Lungs/Pleura: No pneumothorax. No pleural effusion. There is peripheral basilar predominant subpleural reticulation and traction bronchiectasis throughout both lungs. There is severe honeycombing throughout both lungs, largely replacing the bilateral lower lobes, right middle lobe and lingula with lesser involvement of the upper lobes. Findings have not appreciably changed in the interval, although have significantly progressed compared to the oldest chest CT from 12/19/2007. There is a new 4.1 x 2.9 cm masslike focus of consolidation in the basilar right upper lobe (series 8/ image 54). No additional new foci of consolidation or significant pulmonary nodules. Upper abdomen: Hypodense 0.6 cm right liver lesion (Series 7/ image 112), too small to characterize, not definitely seen on prior CT studies. Partially visualize simple appearing 5.9 cm upper right renal cyst. Stable thickening of the adrenal glands without discrete adrenal nodule. Musculoskeletal: No aggressive appearing focal osseous lesions. Marked thoracic spondylosis. Partially visualized surgical hardware from ACDF in the lower cervical spine. IMPRESSION: 1. New masslike focus of consolidation in the basilar right upper lobe measuring 4.1 x 2.9 cm, indeterminate. Differential includes neoplasm or infection. Consider one of the following in 3 months for both low-risk and high-risk individuals: (a) repeat chest CT, (b) follow-up PET-CT, or (c) tissue sampling. This  recommendation follows the consensus statement: Guidelines for Management of Incidental Pulmonary Nodules Detected on CT Images:From the Fleischner Society 2017; published online before print (10.1148/[id number]). 2. 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 3. Mild mediastinal lymphadenopathy, not appreciably changed, nonspecific. 4. Aortic atherosclerosis.  One vessel coronary atherosclerosis. These results will be called to the ordering clinician or representative by the Radiologist Assistant, and communication documented in the PACS or zVision Dashboard. Electronically Signed   By: Ilona Sorrel M.D.   On: 11/11/2015 15:54   Nm Pet Image Initial (pi) Skull Base To Thigh  Result Date: 11/18/2015 CLINICAL DATA:  Initial treatment strategy for right lung mass. EXAM: NUCLEAR MEDICINE PET SKULL BASE TO THIGH TECHNIQUE: 9.7 mCi F-18 FDG was injected intravenously. Full-ring PET imaging was  performed from the skull base to thigh after the radiotracer. CT data was obtained and used for attenuation correction and anatomic localization. FASTING BLOOD GLUCOSE:  Value: 122 mg/dl COMPARISON:  Multiple exams, including 11/11/2015 FINDINGS: NECK Focal hypermetabolic activity in the midline of the posterior superior nasopharynx, maximum standard uptake value 8.5, size of hypermetabolic focus 1.2 cm. Small focus of hypermetabolic activity along the posterior inferior margin of the left parotid gland, correlating with a small lymph node measuring 5 mm in short axis in this vicinity, maximum standard uptake value 7.0. Muscular activity in the vicinity of the right craniocervical junction thought to be physiologic. CHEST The airspace opacity anteriorly in the right upper lobe has a maximum standard uptake value of 10.8. The hypermetabolic portion measures about 2.0 by 1.7 cm and there appears to be some atelectasis distal to this lesion.  There is some clustered right lower paratracheal lymph nodes, the larger of which measures 0.9 cm in short axis, maximum standard uptake value 5.8. There are other small paratracheal and AP window lymph nodes the which are not overtly hypermetabolic. Cardiomegaly noted. Underlying paraseptal emphysema and honeycombing. ABDOMEN/PELVIS No abnormal hypermetabolic activity within the liver, pancreas, adrenal glands, or spleen. No hypermetabolic lymph nodes in the abdomen or pelvis. Right kidney upper pole cyst. Bilateral nonobstructive nephrolithiasis. Physiologic activity in bowel. Aortoiliac atherosclerotic vascular disease. SKELETON No focal hypermetabolic activity to suggest skeletal metastasis. Fused sacroiliac joints. Lower lumbar spondylosis and degenerative disc disease causing impingement at the L4-5 level. IMPRESSION: 1. Abnormal hypermetabolic lesion, maximum standard uptake value 10.8, associated with the right upper lobe nodule. The hypermetabolic activity measures 2.0 by 1.7 cm in this vicinity, and there is some distal atelectasis associated with the lesion. Appearance favors malignancy, active granulomatous infectious process is a less likely differential diagnostic consideration. Assuming non-small cell lung cancer, and assuming that the faint hypermetabolic activity in the right paratracheal node does represent early metastatic disease, and assuming that the findings in the neck are unrelated, appearance favors T1 N2 M0 disease (stage IIIA). 2. Mildly hypermetabolic right lower paratracheal lymph nodes. Early metastatic disease to these ipsilateral lymph nodes not excluded. 3. 1.2 cm focus of hypermetabolic activity along the posterior superior nasopharynx, maximum SUV 8.5, indeterminate for malignancy versus focal lymphoid activation, may merit otolaryngology referral. There is also a small hypermetabolic lymph node along the posterior inferior margin of the left parotid gland which merits  observation. 4. Other imaging findings of potential clinical significance: Cardiomegaly. Emphysema with honeycombing. Bilateral nonobstructive nephrolithiasis. Aortoiliac atherosclerotic vascular disease. Lumbar spondylosis and degenerative disc disease with impingement at L4-5. Fused sacroiliac joints. Electronically Signed   By: Van Clines M.D.   On: 11/18/2015 08:40    Labs:  CBC:  Recent Labs  01/20/15 1016 05/12/15 0947 11/10/15 0933 12/05/15 0604  WBC 15.3* 17.9* 12.3* 12.4*  HGB 13.8 15.1 14.2 14.0  HCT 41.5 42.8* 42.8 43.4  PLT 397  --  356 348    COAGS:  Recent Labs  12/05/15 0604  INR 1.01  APTT 28    BMP:  Recent Labs  01/20/15 1016 05/12/15 0927 11/10/15 0933  NA 136 136 139  K 4.9 4.3 4.5  CL 103 103 104  CO2 '22 22 20  '$ GLUCOSE 95 135* 87  BUN '23 24 21  '$ CALCIUM 8.9 9.4 9.5  CREATININE 1.13 1.24 1.07  GFRNONAA  --   --  72  GFRAA  --   --  83    LIVER FUNCTION TESTS:  Recent Labs  01/20/15 1016 05/12/15 0927 11/10/15 0933  BILITOT 0.4 0.3 0.4  AST '14 13 15  '$ ALT '13 10 14  '$ ALKPHOS 102 119* 104  PROT 7.4 7.8 7.7  ALBUMIN 4.0 4.2 4.1    TUMOR MARKERS: No results for input(s): AFPTM, CEA, CA199, CHROMGRNA in the last 8760 hours.  Assessment and Plan:  Right upper lobe lung mass  Will proceed with CT guided biopsy today.  I did discuss with him that he is high risk for pneumothorax given his underlying interstitial lung disease.  Risks and Benefits discussed with the patient including, but not limited to bleeding, hemoptysis, respiratory failure requiring intubation, infection, pneumothorax requiring chest tube placement, stroke from air embolism or even death.  All of the patient's questions were answered, patient is agreeable to proceed. Consent signed and in chart.  Thank you for this interesting consult.  I greatly enjoyed meeting CARTEZ MOGLE and look forward to participating in their care.  A copy of this report was  sent to the requesting provider on this date.  Electronically Signed: Murrell Redden PA-C 12/05/2015, 7:44 AM   I spent a total of  30 Minutes  in face to face in clinical consultation, greater than 50% of which was counseling/coordinating care for CT lung biopsy.

## 2015-12-05 NOTE — Sedation Documentation (Signed)
Report given to carol, RN

## 2015-12-05 NOTE — Sedation Documentation (Signed)
Sitting up in bed watching TV. In no distress.

## 2015-12-05 NOTE — Sedation Documentation (Signed)
Sitting up in bed. In no distress.

## 2015-12-05 NOTE — Sedation Documentation (Signed)
Chest tube placed by Dr. Francena Hanly. Pt in no distress.

## 2015-12-05 NOTE — Progress Notes (Signed)
eLink Physician-Brief Progress Note Patient Name: [name] DOB: 07-30-1950 MRN: [id number]   Date of Service  12/05/2015  HPI/Events of Note  Patient requests sleeping aid.  eICU Interventions  Will order Ambien 5 mg PO X 1.      Intervention Category Minor Interventions: Routine modifications to care plan (e.g. PRN medications for pain, fever)  Sommer,Steven Eugene 12/05/2015, 9:23 PM

## 2015-12-05 NOTE — Care Management Note (Signed)
Case Management Note  Patient Details  Name: [name] MRN: [id number] Date of Birth: [birth year]  Subjective/Objective:  Patient lives at home alone, pta indep.  Presents for ct guided bx of lung,was complicated by  ptx , chest tube placed.  Patient has pcp, he has medication coverage with insurance and he has transport at discharge. Patient is on oxygen of 4 liters continously.   NCM will cont to follow for dc need                    Action/Plan:    Expected Discharge Date:                  Expected Discharge Plan:  Home/Self Care  In-House Referral:     Discharge planning Services  CM Consult  Post Acute Care Choice:    Choice offered to:     DME Arranged:    DME Agency:     HH Arranged:    HH Agency:     Status of Service:  In process, will continue to follow  If discussed at Long Length of Stay Meetings, dates discussed:    Additional Comments:  Zenon Mayo, RN 12/05/2015, 3:54 PM

## 2015-12-05 NOTE — Procedures (Signed)
Technically successful CT guided biopsy of indeterminate hypermetabolic right upper lob pulmonary nodule  EBL: Minimal.  Procedure complicated be development of a PTX, requiring CT placement.   Ronny Bacon, MD Pager #: [phone]

## 2015-12-05 NOTE — H&P (Signed)
Name: [name] MRN: [id number] DOB: 06/24/1950    ADMISSION DATE:  12/05/2015 CONSULTATION DATE:  12/05/15  REFERRING MD :  Pascal Lux - IR  CHIEF COMPLAINT:  Pneumothorax   HISTORY OF PRESENT ILLNESS:  [name] is a 65 y.o. 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  He is followed in our clinic by Dr. Chase Caller (previously saw Dr. Randol Kern at [facility] but has not seen him since 2013).  Seen as outpatient Aug 2017 with SOB and had PET CT which demonstrated hypermetabolic RUL mass.  He was subsequently referred to IR for CT guided biopsy.  He presented to [facility] 12/05/15 for above procedure.  Procedure was performed successfully; however, was complicated by pneumothorax.  He had chest tube placed while in IR suite and PCCM was then called and asked to admit pt.  He currently denies SOB but does have some mild chest pain at chest tube insertion site. He is on 4L O2 which he chronically requires 24/7.   PAST MEDICAL HISTORY :   has a past medical history of Diabetes mellitus without complication ([place]); Dyspnea; GERD (gastroesophageal reflux disease); Hypertension; Pulmonary fibrosis ([place]); and Rheumatoid arthritis(714.0) (2005).  has a past surgical history that includes Excision mass R lateral arm 2010 (2010); Mass excision (03/08/2011); Neck surgery; and Eye surgery. Prior to Admission medications   Medication Sig Start Date End Date Taking? Authorizing Provider  albuterol (VENTOLIN HFA) 108 (90 BASE) MCG/ACT inhaler Inhale 2 puffs into the lungs every 4 (four) hours as needed for wheezing or shortness of breath. 11/20/13   Shawnee Knapp, MD  aspirin 81 MG tablet Take 81 mg by mouth daily.    Historical Provider, MD  buPROPion (WELLBUTRIN XL) 150 MG 24 hr tablet Take 1 tab po qd, then increase to 2 tabs in the morning in 2 weeks if needed. Patient taking differently: Take 300 mg by mouth daily.  11/10/15   Shawnee Knapp, MD  gabapentin (NEURONTIN) 400  MG capsule Take 2 capsules (800 mg total) by mouth 3 (three) times daily. 03/16/15   Shawnee Knapp, MD  ipratropium-albuterol (DUONEB) 0.5-2.5 (3) MG/3ML SOLN TAKE 3 MLS BY NEBULIZATION EVERY 4 (FOUR) HOURS AS NEEDED. 09/27/15   Shawnee Knapp, MD  losartan (COZAAR) 50 MG tablet TAKE 1 TABLET BY MOUTH EVERY DAY FOR BLOOD PRESSURE 11/25/15   Shawnee Knapp, MD  meloxicam (MOBIC) 7.5 MG tablet Take 1 tablet (7.5 mg total) by mouth daily as needed for pain. Do not use with any other otc pain medication other than acetaminophen 03/18/15   Shawnee Knapp, MD  metFORMIN (GLUCOPHAGE) 500 MG tablet TAKE 1 TABLET (500 MG TOTAL) BY MOUTH 2 (TWO) TIMES DAILY WITH A MEAL. FOR DIABETES 09/24/15   Shawnee Knapp, MD  mycophenolate (CELLCEPT) 500 MG tablet TAKE 2 TABLETS TWICE DAILY 1 HOUR BEFORE OR 2 HOURS  AFTER  MEAL 08/23/15   Brand Males, MD  ONE TOUCH ULTRA TEST test strip TEST BLOOD SUGAR DAILY. DX CODE: E11.65 05/29/15   Shawnee Knapp, MD  pravastatin (PRAVACHOL) 40 MG tablet TAKE 1 TABLET (40 MG TOTAL) BY MOUTH AT BEDTIME. FOR CHOLESTEROL 10/26/15   Mancel Bale, PA-C  sulfamethoxazole-trimethoprim (BACTRIM DS,SEPTRA DS) 800-160 MG tablet TAKE 1 TABLET BY MOUTH THREE TIMES A WEEK (NEED APPT FOR MORE REFILLS!!) 10/24/15   Brand Males, MD   No Known Allergies  FAMILY HISTORY:  family history includes Cancer  in his father; Rheum arthritis in his sister. SOCIAL HISTORY:  reports that he quit smoking about 7 years ago. He started smoking about 46 years ago. He has a 19.50 pack-year smoking history. He has never used smokeless tobacco. He reports that he does not drink alcohol or use drugs.  REVIEW OF SYSTEMS:   All negative; except for those that are bolded, which indicate positives.  Constitutional: weight loss, weight gain, night sweats, fevers, chills, fatigue, weakness.  HEENT: headaches, sore throat, sneezing, nasal congestion, post nasal drip, difficulty swallowing, tooth/dental problems, visual complaints, visual changes,  ear aches. Neuro: difficulty with speech, weakness, numbness, ataxia. CV:  chest discomfort at chest tube insertion site, orthopnea, PND, swelling in lower extremities, dizziness, palpitations, syncope.  Resp: cough, hemoptysis, dyspnea, wheezing. GI: heartburn, indigestion, abdominal pain, nausea, vomiting, diarrhea, constipation, change in bowel habits, loss of appetite, hematemesis, melena, hematochezia.  GU: dysuria, change in color of urine, urgency or frequency, flank pain, hematuria. MSK: joint pain or swelling, decreased range of motion. Psych: change in mood or affect, depression, anxiety, suicidal ideations, homicidal ideations. Skin: rash, itching, bruising.    SUBJECTIVE:  Breathing comfortable at rest.  VITAL SIGNS: Temp:  [97.6 F (36.4 C)] 97.6 F (36.4 C) (09/11 0630) Pulse Rate:  [74-94] 79 (09/11 1200) Resp:  [19-27] 23 (09/11 1200) BP: (124-183)/(86-110) 162/90 (09/11 1200) SpO2:  [90 %-96 %] 94 % (09/11 1200) Weight:  [200 lb (90.7 kg)] 200 lb (90.7 kg) (09/11 0630)  PHYSICAL EXAMINATION: General: Adult male, resting watching TV,  in NAD. Neuro: A&O x 3, non-focal.  HEENT: [place]/AT. PERRL, sclerae anicteric. Cardiovascular: RRR, no M/R/G.  Lungs: Respirations even and unlabored.  Coarse bilaterally.  R anterior chest tube in place, no air leak. Abdomen: BS x 4, soft, NT/ND.  Musculoskeletal: No gross deformities, no edema.  Skin: Intact, warm, no rashes.    No results for input(s): NA, K, CL, CO2, BUN, CREATININE, GLUCOSE in the last 168 hours.  Recent Labs Lab 12/05/15 0604  HGB 14.0  HCT 43.4  WBC 12.4*  PLT 348   Ct Biopsy  Result Date: 12/05/2015 INDICATION: History of pulmonary fibrosis, now with indeterminate right upper lobe hypermetabolic mass EXAM: 1. CT-GUIDED RIGHT UPPER LOBE PULMONARY MASS BIOPSY 2. CT-GUIDED RIGHT CHEST TUBE PLACEMENT COMPARISON:  PET-CT - 11/18/2015; chest CT- 11/11/2015 MEDICATIONS: None. ANESTHESIA/SEDATION: A total of  Fentanyl 25 mcg IV; Versed 1 mg IV was administered for both the CT-guided lung biopsy and right-sided chest tube placement. Sedation time: 40 minutes; The patient was continuously monitored during the procedure by the interventional radiology nurse under my direct supervision. CONTRAST:  None COMPLICATIONS: SIR LEVEL D - Requires major therapy, prolonged hospitalization (>48 hours). Procedure complicated by development of a enlarging pneumothorax requiring right-sided chest tube placement. Patient remained hemodynamically stable throughout the procedure and right-sided chest tube placement. PROCEDURE: Informed consent was obtained from the patient following an explanation of the procedure, risks, benefits and alternatives. The patient understands,agrees and consents for the procedure. All questions were addressed. A time out was performed prior to the initiation of the procedure. The patient was positioned supine on the CT table and a limited chest CT was performed for procedural planning demonstrating unchanged size and appearance of the approximately 2.8 x 2.1 cm known hypermetabolic nodule within the right upper lobe (image 25, series 2). The operative site was prepped and draped in the usual sterile fashion. Under sterile conditions and local anesthesia, a 17 gauge coaxial needle was advanced into  the peripheral aspect of the nodule. Positioning was confirmed with intermittent CT fluoroscopy and followed by the acquisition of 3 core needle biopsies with an 18 gauge core needle biopsy device. Intra procedural CT imaging demonstrated the development of a slowly enlarging right-sided pneumothorax. As such, the coaxial needle was cannulated with a short Amplatz wire which was coiled within the right pleural space. Appropriate positioning was confirmed and the coaxial needle was exchanged for a Yueh sheath needle. Attempts were made to aspirate the right-sided pneumothorax however the pneumothorax was noted to  continue to expand on subsequent CT images. Given patient's advanced pulmonary fibrosis and lack of significant pulmonary reserve, the decision was made to place a right-sided chest tube. As such, the Yueh sheath needle was cannulated with an Amplatz wire which was coiled within the right pleural space. Appropriate positioning was confirmed with CT imaging. The tract was serially dilated allowing placement of a 10 French all-purpose drainage catheter with end coiled and locked within the cranial lateral aspect of the right lung apex. Postprocedural imaging confirmed appropriate positioning. The chest tube was secured at the skin entrance site within interrupted suture and connected to a pleural vac device. A dressing was placed. The patient otherwise tolerated the procedure well without immediate postprocedural complication. IMPRESSION: 1. Technically successful CT guided core needle core biopsy of hypermetabolic right upper lobe pulmonary nodule. 2. Procedure complicated by development of an asymptomatic though enlarging right-sided pneumothorax requiring chest tube placement. PLAN: Given patient's pulmonary fibrosis, the patient be admitted by the CCM service. Electronically Signed   By: Sandi Mariscal M.D.   On: 12/05/2015 11:35   Dg Chest Port 1 View  Result Date: 12/05/2015 CLINICAL DATA:  Status post small caliber chest tube placement. For post biopsy right-sided pneumothorax. EXAM: PORTABLE CHEST 1 VIEW COMPARISON:  CT scan of the chest of today's date at 8:49 a.m. FINDINGS: There has been placement of a small caliber chest tube in the right upper hemi thorax. The known right-sided pneumothorax is not clearly evident. Extensive fibrotic changes are present throughout both lungs. The cardiac silhouette is enlarged. The pulmonary vascularity is prominent centrally. There is calcification in the wall of the aortic arch. The patient has undergone previous lower anterior cervical fusion. IMPRESSION: Interval  placement of a small caliber right-sided chest tube. No definite pneumothorax is visible currently. Extensive underlying pulmonary fibrotic changes. Aortic atherosclerosis. Electronically Signed   By: David  Martinique M.D.   On: 12/05/2015 11:54   Ct Perc Pleural Drain W/indwell Cath W/img Guide  Result Date: 12/05/2015 INDICATION: History of pulmonary fibrosis, now with indeterminate right upper lobe hypermetabolic mass EXAM: 1. CT-GUIDED RIGHT UPPER LOBE PULMONARY MASS BIOPSY 2. CT-GUIDED RIGHT CHEST TUBE PLACEMENT COMPARISON:  PET-CT - 11/18/2015; chest CT- 11/11/2015 MEDICATIONS: None. ANESTHESIA/SEDATION: A total of Fentanyl 25 mcg IV; Versed 1 mg IV was administered for both the CT-guided lung biopsy and right-sided chest tube placement. Sedation time: 40 minutes; The patient was continuously monitored during the procedure by the interventional radiology nurse under my direct supervision. CONTRAST:  None COMPLICATIONS: SIR LEVEL D - Requires major therapy, prolonged hospitalization (>48 hours). Procedure complicated by development of a enlarging pneumothorax requiring right-sided chest tube placement. Patient remained hemodynamically stable throughout the procedure and right-sided chest tube placement. PROCEDURE: Informed consent was obtained from the patient following an explanation of the procedure, risks, benefits and alternatives. The patient understands,agrees and consents for the procedure. All questions were addressed. A time out was performed prior to the initiation  of the procedure. The patient was positioned supine on the CT table and a limited chest CT was performed for procedural planning demonstrating unchanged size and appearance of the approximately 2.8 x 2.1 cm known hypermetabolic nodule within the right upper lobe (image 25, series 2). The operative site was prepped and draped in the usual sterile fashion. Under sterile conditions and local anesthesia, a 17 gauge coaxial needle was advanced  into the peripheral aspect of the nodule. Positioning was confirmed with intermittent CT fluoroscopy and followed by the acquisition of 3 core needle biopsies with an 18 gauge core needle biopsy device. Intra procedural CT imaging demonstrated the development of a slowly enlarging right-sided pneumothorax. As such, the coaxial needle was cannulated with a short Amplatz wire which was coiled within the right pleural space. Appropriate positioning was confirmed and the coaxial needle was exchanged for a Yueh sheath needle. Attempts were made to aspirate the right-sided pneumothorax however the pneumothorax was noted to continue to expand on subsequent CT images. Given patient's advanced pulmonary fibrosis and lack of significant pulmonary reserve, the decision was made to place a right-sided chest tube. As such, the Yueh sheath needle was cannulated with an Amplatz wire which was coiled within the right pleural space. Appropriate positioning was confirmed with CT imaging. The tract was serially dilated allowing placement of a 10 French all-purpose drainage catheter with end coiled and locked within the cranial lateral aspect of the right lung apex. Postprocedural imaging confirmed appropriate positioning. The chest tube was secured at the skin entrance site within interrupted suture and connected to a pleural vac device. A dressing was placed. The patient otherwise tolerated the procedure well without immediate postprocedural complication. IMPRESSION: 1. Technically successful CT guided core needle core biopsy of hypermetabolic right upper lobe pulmonary nodule. 2. Procedure complicated by development of an asymptomatic though enlarging right-sided pneumothorax requiring chest tube placement. PLAN: Given patient's pulmonary fibrosis, the patient be admitted by the CCM service. Electronically Signed   By: Sandi Mariscal M.D.   On: 12/05/2015 11:35    STUDIES:  CXR 9/11 > R chest tube in place.  SIGNIFICANT EVENTS   9/11 > to IR for CT guided bx of RUL mass > c/b PTX requiring chest tube placement > admitted by PCCM.  ASSESSMENT / PLAN:  Pneumothorax - following CT guided RUL bx. Plan: Continue chest tube to suction. Pain control - PRN fentanyl. Incentive spirometry. CXR in AM.  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 Chronic hypoxic respiratory failure - due to above. Plan: Continue cellcept, bactrim, albuterol, duonebs. Continue supplemental O2 as needed to maintain SpO2 > 92%.  DM. Plan: Continue preadmission metformin.  HTN, HLD. Plan: Continue preadmission losartan, pravastatin, ASA.   Montey Hora, [facility] Pager: [phone]  or [phone] 12/05/2015, 12:35 PM   STAFF NOTE: I, Merrie Roof, MD FACP have personally reviewed patient's available data, including medical history, events of note, physical examination and test results as part of my evaluation. I have discussed with resident/NP and other care providers such as pharmacist, RN and RRT. In addition, I personally evaluated patient and elicited key findings of:  Awake, watching TV in bed, no distress at all, coarse BS, Ct in place,no leak noted, looking at CT , such high risk for pTX, but definitely this was the best method of BX, just a high risk pt, pcxr noted likely small sliver ptx noted, keep to suction, repeat pcxr in am,  add diet, admit to hospital , follow clinical status, continued regimen as above cellcept, bactrim (pcp prev), assess lft, cbc   Lavon Paganini. Titus Mould, MD, [place] Pgr: [facility] 12/05/2015 1:33 PM

## 2015-12-05 NOTE — Sedation Documentation (Signed)
Pt awake and alert. In no distress. Pt moved to nurse station while waiting for inpt room.

## 2015-12-05 NOTE — Progress Notes (Signed)
eLink Physician-Brief Progress Note Patient Name: [name] DOB: 1950/08/08 MRN: [id number]   Date of Service  12/05/2015  HPI/Events of Note  Blood glucose = 179. Currently on PO diet.   eICU Interventions  Will order AC/HS sensitive Novolog SSI.     Intervention Category Intermediate Interventions: Hyperglycemia - evaluation and treatment  [name] 12/05/2015, 4:40 PM

## 2015-12-06 ENCOUNTER — Inpatient Hospital Stay (HOSPITAL_COMMUNITY): Payer: Medicare Other

## 2015-12-06 DIAGNOSIS — I10 Essential (primary) hypertension: Secondary | ICD-10-CM

## 2015-12-06 DIAGNOSIS — E785 Hyperlipidemia, unspecified: Secondary | ICD-10-CM

## 2015-12-06 LAB — BASIC METABOLIC PANEL
ANION GAP: 10 (ref 5–15)
BUN: 19 mg/dL (ref 6–20)
CHLORIDE: 103 mmol/L (ref 101–111)
CO2: 26 mmol/L (ref 22–32)
Calcium: 8.8 mg/dL — ABNORMAL LOW (ref 8.9–10.3)
Creatinine, Ser: 1.13 mg/dL (ref 0.61–1.24)
GFR calc Af Amer: >60 mL/min (ref >60)
GLUCOSE: 103 mg/dL — AB (ref 65–99)
POTASSIUM: 4.1 mmol/L (ref 3.5–5.1)
SODIUM: 139 mmol/L (ref 135–145)

## 2015-12-06 LAB — CBC
HCT: 40.9 % (ref 39.0–52.0)
HEMOGLOBIN: 12.8 g/dL — AB (ref 13.0–17.0)
MCH: 29.4 pg (ref 26.0–34.0)
MCHC: 31.3 g/dL (ref 30.0–36.0)
MCV: 94 fL (ref 78.0–100.0)
PLATELETS: 291 10*3/uL (ref 150–400)
RBC: 4.35 MIL/uL (ref 4.22–5.81)
RDW: 13.4 % (ref 11.5–15.5)
WBC: 12.3 10*3/uL — AB (ref 4.0–10.5)

## 2015-12-06 LAB — GLUCOSE, CAPILLARY
GLUCOSE-CAPILLARY: 148 mg/dL — AB (ref 65–99)
Glucose-Capillary: 164 mg/dL — ABNORMAL HIGH (ref 65–99)

## 2015-12-06 LAB — MAGNESIUM: MAGNESIUM: 1.7 mg/dL (ref 1.7–2.4)

## 2015-12-06 LAB — PHOSPHORUS: Phosphorus: 3.7 mg/dL (ref 2.5–4.6)

## 2015-12-06 MED ORDER — ZOLPIDEM TARTRATE 5 MG PO TABS
5.0000 mg | ORAL_TABLET | Freq: Every evening | ORAL | Status: DC | PRN
  Administered 2015-12-06 – 2015-12-07 (×2): 5 mg via ORAL
  Filled 2015-12-06 (×2): qty 1

## 2015-12-06 MED ORDER — IPRATROPIUM-ALBUTEROL 0.5-2.5 (3) MG/3ML IN SOLN: 3.0000 mL | Freq: Four times a day (QID) | RESPIRATORY_TRACT | Status: DC | PRN

## 2015-12-06 NOTE — Progress Notes (Signed)
[facility] Progress Note Patient Name: KENN REKOWSKI DOB: 02-04-1951 MRN: [id number]   Date of Service  12/06/2015  HPI/Events of Note  Pt requesting ambien. Tolerated it last night.   eICU Interventions  Ambien '5mg'$  prn qHS.     Intervention Category Minor Interventions: Other:  Dimas Chyle 12/06/2015, 10:05 PM

## 2015-12-06 NOTE — H&P (Signed)
Name: [name] MRN: [id number] DOB: 09-04-1950    ADMISSION DATE:  12/05/2015 CONSULTATION DATE:  12/05/15  REFERRING MD :  Pascal Lux - IR  CHIEF COMPLAINT:  Pneumothorax   HISTORY OF PRESENT ILLNESS:  [name] is a 65 y.o. 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  He is followed in our clinic by Dr. Chase Caller (previously saw Dr. Randol Kern at [facility] ILD clinic but has not seen him since 2013).  Seen as outpatient Aug 2017 with SOB and had PET CT which demonstrated hypermetabolic RUL mass.  He was subsequently referred to IR for CT guided biopsy.  He presented to [facility] 12/05/15 for above procedure.  Procedure was performed successfully; however, was complicated by pneumothorax.  He had chest tube placed while in IR suite and PCCM was then called and asked to admit pt.  He currently denies SOB but does have some mild chest pain at chest tube insertion site. He is on 4L O2 which he chronically requires 24/7.    SUBJECTIVE:  Breathing comfortable at rest, watching TV in NAD. Anxious to go home.  VITAL SIGNS: Temp:  [97.5 F (36.4 C)-98.3 F (36.8 C)] 98.2 F (36.8 C) (09/12 0803) Pulse Rate:  [74-99] 81 (09/12 0803) Resp:  [17-28] 17 (09/12 0803) BP: (121-175)/(73-109) 121/91 (09/12 0803) SpO2:  [90 %-97 %] 95 % (09/12 0803) Weight:  [202 lb 6.1 oz (91.8 kg)-203 lb 11.3 oz (92.4 kg)] 203 lb 11.3 oz (92.4 kg) (09/12 0500)  PHYSICAL EXAMINATION: General: Adult male, resting watching TV,  in NAD, anxious to go home.Marland Kitchen Neuro: A&O x 3, non-focal.  HEENT: [place]/AT. PERRL, sclerae anicteric. Cardiovascular: RRR, no M/R/G.  Lungs: Respirations even and unlabored.  Coarse bilaterally, with crackles throughout.  R anterior chest tube in place to 20 cm suction, no air leak. Abdomen: BS x 4, soft, NT/ND.  Musculoskeletal: No gross deformities, no edema.  Skin: Intact, warm, no rashes.     Recent Labs Lab 12/06/15 0352  NA 139  K 4.1  CL 103  CO2  26  BUN 19  CREATININE 1.13  GLUCOSE 103*    Recent Labs Lab 12/05/15 0604 12/06/15 0352  HGB 14.0 12.8*  HCT 43.4 40.9  WBC 12.4* 12.3*  PLT 348 291   Ct Biopsy  Result Date: 12/05/2015 INDICATION: History of pulmonary fibrosis, now with indeterminate right upper lobe hypermetabolic mass EXAM: 1. CT-GUIDED RIGHT UPPER LOBE PULMONARY MASS BIOPSY 2. CT-GUIDED RIGHT CHEST TUBE PLACEMENT COMPARISON:  PET-CT - 11/18/2015; chest CT- 11/11/2015 MEDICATIONS: None. ANESTHESIA/SEDATION: A total of Fentanyl 25 mcg IV; Versed 1 mg IV was administered for both the CT-guided lung biopsy and right-sided chest tube placement. Sedation time: 40 minutes; The patient was continuously monitored during the procedure by the interventional radiology nurse under my direct supervision. CONTRAST:  None COMPLICATIONS: SIR LEVEL D - Requires major therapy, prolonged hospitalization (>48 hours). Procedure complicated by development of a enlarging pneumothorax requiring right-sided chest tube placement. Patient remained hemodynamically stable throughout the procedure and right-sided chest tube placement. PROCEDURE: Informed consent was obtained from the patient following an explanation of the procedure, risks, benefits and alternatives. The patient understands,agrees and consents for the procedure. All questions were addressed. A time out was performed prior to the initiation of the procedure. The patient was positioned supine on the CT table and a limited chest CT was performed for procedural planning demonstrating unchanged size and appearance of the approximately 2.8 x 2.1 cm  known hypermetabolic nodule within the right upper lobe (image 25, series 2). The operative site was prepped and draped in the usual sterile fashion. Under sterile conditions and local anesthesia, a 17 gauge coaxial needle was advanced into the peripheral aspect of the nodule. Positioning was confirmed with intermittent CT fluoroscopy and followed by  the acquisition of 3 core needle biopsies with an 18 gauge core needle biopsy device. Intra procedural CT imaging demonstrated the development of a slowly enlarging right-sided pneumothorax. As such, the coaxial needle was cannulated with a short Amplatz wire which was coiled within the right pleural space. Appropriate positioning was confirmed and the coaxial needle was exchanged for a Yueh sheath needle. Attempts were made to aspirate the right-sided pneumothorax however the pneumothorax was noted to continue to expand on subsequent CT images. Given patient's advanced pulmonary fibrosis and lack of significant pulmonary reserve, the decision was made to place a right-sided chest tube. As such, the Yueh sheath needle was cannulated with an Amplatz wire which was coiled within the right pleural space. Appropriate positioning was confirmed with CT imaging. The tract was serially dilated allowing placement of a 10 French all-purpose drainage catheter with end coiled and locked within the cranial lateral aspect of the right lung apex. Postprocedural imaging confirmed appropriate positioning. The chest tube was secured at the skin entrance site within interrupted suture and connected to a pleural vac device. A dressing was placed. The patient otherwise tolerated the procedure well without immediate postprocedural complication. IMPRESSION: 1. Technically successful CT guided core needle core biopsy of hypermetabolic right upper lobe pulmonary nodule. 2. Procedure complicated by development of an asymptomatic though enlarging right-sided pneumothorax requiring chest tube placement. PLAN: Given patient's pulmonary fibrosis, the patient be admitted by the CCM service. Electronically Signed   By: Sandi Mariscal M.D.   On: 12/05/2015 11:35   Dg Chest Port 1 View  Result Date: 12/06/2015 CLINICAL DATA:  Respiratory failure, status post lung biopsy with chest tube treatment of a pneumothorax. EXAM: PORTABLE CHEST 1 VIEW  COMPARISON:  Portable chest x-ray of December 05, 2015 and chest CT scan of the same day. FINDINGS: Chronic pulmonary fibrotic changes are again observed. No definite pneumothorax on the right is visible. The small caliber right-sided chest tube is in stable position. The heart is top-normal in size. The pulmonary vascularity is not clearly engorged. There is calcification in the wall of the aortic arch. IMPRESSION: No pneumothorax is visible on today's study. Stable severe fibrotic change bilaterally. Aortic atherosclerosis. Electronically Signed   By: David  Martinique M.D.   On: 12/06/2015 08:00   Dg Chest Port 1 View  Result Date: 12/05/2015 CLINICAL DATA:  Status post small caliber chest tube placement. For post biopsy right-sided pneumothorax. EXAM: PORTABLE CHEST 1 VIEW COMPARISON:  CT scan of the chest of today's date at 8:49 a.m. FINDINGS: There has been placement of a small caliber chest tube in the right upper hemi thorax. The known right-sided pneumothorax is not clearly evident. Extensive fibrotic changes are present throughout both lungs. The cardiac silhouette is enlarged. The pulmonary vascularity is prominent centrally. There is calcification in the wall of the aortic arch. The patient has undergone previous lower anterior cervical fusion. IMPRESSION: Interval placement of a small caliber right-sided chest tube. No definite pneumothorax is visible currently. Extensive underlying pulmonary fibrotic changes. Aortic atherosclerosis. Electronically Signed   By: David  Martinique M.D.   On: 12/05/2015 11:54   Ct Perc Pleural Drain W/indwell Cath W/img Guide  Result  Date: 12/05/2015 INDICATION: History of pulmonary fibrosis, now with indeterminate right upper lobe hypermetabolic mass EXAM: 1. CT-GUIDED RIGHT UPPER LOBE PULMONARY MASS BIOPSY 2. CT-GUIDED RIGHT CHEST TUBE PLACEMENT COMPARISON:  PET-CT - 11/18/2015; chest CT- 11/11/2015 MEDICATIONS: None. ANESTHESIA/SEDATION: A total of Fentanyl 25 mcg IV;  Versed 1 mg IV was administered for both the CT-guided lung biopsy and right-sided chest tube placement. Sedation time: 40 minutes; The patient was continuously monitored during the procedure by the interventional radiology nurse under my direct supervision. CONTRAST:  None COMPLICATIONS: SIR LEVEL D - Requires major therapy, prolonged hospitalization (>48 hours). Procedure complicated by development of a enlarging pneumothorax requiring right-sided chest tube placement. Patient remained hemodynamically stable throughout the procedure and right-sided chest tube placement. PROCEDURE: Informed consent was obtained from the patient following an explanation of the procedure, risks, benefits and alternatives. The patient understands,agrees and consents for the procedure. All questions were addressed. A time out was performed prior to the initiation of the procedure. The patient was positioned supine on the CT table and a limited chest CT was performed for procedural planning demonstrating unchanged size and appearance of the approximately 2.8 x 2.1 cm known hypermetabolic nodule within the right upper lobe (image 25, series 2). The operative site was prepped and draped in the usual sterile fashion. Under sterile conditions and local anesthesia, a 17 gauge coaxial needle was advanced into the peripheral aspect of the nodule. Positioning was confirmed with intermittent CT fluoroscopy and followed by the acquisition of 3 core needle biopsies with an 18 gauge core needle biopsy device. Intra procedural CT imaging demonstrated the development of a slowly enlarging right-sided pneumothorax. As such, the coaxial needle was cannulated with a short Amplatz wire which was coiled within the right pleural space. Appropriate positioning was confirmed and the coaxial needle was exchanged for a Yueh sheath needle. Attempts were made to aspirate the right-sided pneumothorax however the pneumothorax was noted to continue to expand on  subsequent CT images. Given patient's advanced pulmonary fibrosis and lack of significant pulmonary reserve, the decision was made to place a right-sided chest tube. As such, the Yueh sheath needle was cannulated with an Amplatz wire which was coiled within the right pleural space. Appropriate positioning was confirmed with CT imaging. The tract was serially dilated allowing placement of a 10 French all-purpose drainage catheter with end coiled and locked within the cranial lateral aspect of the right lung apex. Postprocedural imaging confirmed appropriate positioning. The chest tube was secured at the skin entrance site within interrupted suture and connected to a pleural vac device. A dressing was placed. The patient otherwise tolerated the procedure well without immediate postprocedural complication. IMPRESSION: 1. Technically successful CT guided core needle core biopsy of hypermetabolic right upper lobe pulmonary nodule. 2. Procedure complicated by development of an asymptomatic though enlarging right-sided pneumothorax requiring chest tube placement. PLAN: Given patient's pulmonary fibrosis, the patient be admitted by the CCM service. Electronically Signed   By: Sandi Mariscal M.D.   On: 12/05/2015 11:35    STUDIES:  CXR 9/11 > R anterior chest tube in place.  SIGNIFICANT EVENTS  9/11 > to IR for CT guided bx of RUL mass > c/b PTX requiring chest tube placement > admitted by PCCM.  ASSESSMENT / PLAN:  Pneumothorax - following CT guided RUL bx.>> Resolved per CXR 9/12 12/06/15 CXR indicates: no pneumothorax, stable severe fibrotic changes bilaterally No significant drainage per CT. No c/o pain or discomfort this am Plan: Continue chest tube to suction.  Pain control - PRN fentanyl. Incentive spirometry. CXR  9/13 Mobilize as able Consider d/cing suction and placing to water seal as no drainage.  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 Chronic hypoxic respiratory failure - due to above. Plan: Continue cellcept,  bactrim, albuterol,  Change Duo Nebs to PRN per patient request Continue supplemental O2 as needed to maintain SpO2 > 92%.   DM. Plan: Continue preadmission metformin. CBG's to monitor  HTN, HLD. Plan: Continue preadmission losartan, pravastatin, ASA.   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  On exam, now there is no leak.  I reviewed CXR myself, no PTX.  Discussed with PCCM-NP.  PTX:  - Water seal chest tube.  - Repeat CXR in AM.  - Anticipate d/c in AM.  IPF:   - Cellcept  . Bactrim.  DM:  - Metformin  - CBGs  HTN:  - Cozaar.  HLD:  - Pravachol.  Anticipate D/C in AM.  Patient seen and examined, agree with above note.  I dictated the care and orders written for this patient under my direction.  Rush Farmer, MD [phone]

## 2015-12-06 NOTE — Progress Notes (Signed)
    Reason for visit: Follow up right chest tube for PTX after RUL mass biopsy yesterday  Subjective: Feeling better. No SOB. Some soreness at tube site  Objective: Physical Exam: BP (!) 121/91 (BP Location: Right Arm)   Pulse 81   Temp 98.2 F (36.8 C) (Oral)   Resp 17   Ht '5\' 9"'$  (1.753 m)   Wt 203 lb 11.3 oz (92.4 kg)   SpO2 95%   BMI 30.08 kg/m  (R)chest tube intact, site clean. Scant serosanguinous output No air leak BS clear    Labs: CBC  Recent Labs  12/05/15 0604 12/06/15 0352  WBC 12.4* 12.3*  HGB 14.0 12.8*  HCT 43.4 40.9  PLT 348 291   BMET  Recent Labs  12/06/15 0352  NA 139  K 4.1  CL 103  CO2 26  GLUCOSE 103*  BUN 19  CREATININE 1.13  CALCIUM 8.8*   LFT No results for input(s): PROT, ALBUMIN, AST, ALT, ALKPHOS, BILITOT, BILIDIR, IBILI, LIPASE in the last 72 hours. PT/INR  Recent Labs  12/05/15 0604  LABPROT 13.3  INR 1.01     Assessment/Plan: S/p right chest tube for PTX after RUL mass biopsy yesterday CXR shows no PTX today, no air leak Will place CT to H2O seal, CXR in am    LOS: 1 day    Ascencion Dike PA-C 12/06/2015 10:39 AM

## 2015-12-07 ENCOUNTER — Inpatient Hospital Stay (HOSPITAL_COMMUNITY): Payer: Medicare Other

## 2015-12-07 DIAGNOSIS — J95811 Postprocedural pneumothorax: Secondary | ICD-10-CM | POA: Diagnosis present

## 2015-12-07 DIAGNOSIS — S270XXD Traumatic pneumothorax, subsequent encounter: Secondary | ICD-10-CM

## 2015-12-07 LAB — GLUCOSE, CAPILLARY
GLUCOSE-CAPILLARY: 147 mg/dL — AB (ref 65–99)
GLUCOSE-CAPILLARY: 188 mg/dL — AB (ref 65–99)
Glucose-Capillary: 113 mg/dL — ABNORMAL HIGH (ref 65–99)
Glucose-Capillary: 125 mg/dL — ABNORMAL HIGH (ref 65–99)
Glucose-Capillary: 165 mg/dL — ABNORMAL HIGH (ref 65–99)

## 2015-12-07 NOTE — Progress Notes (Signed)
Name: [name] MRN: [id number] DOB: 1950-12-04    ADMISSION DATE:  12/05/2015 CONSULTATION DATE:  12/05/15  REFERRING MD :  Pascal Lux - IR  CHIEF COMPLAINT:  Pneumothorax   HISTORY OF PRESENT ILLNESS:  [name] is a 65 y.o. 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  He is followed in our clinic by Dr. Chase Caller (previously saw Dr. Randol Kern at [facility] clinic but has not seen him since 2013).  Seen as outpatient Aug 2017 with SOB and had PET CT which demonstrated hypermetabolic RUL mass.  He was subsequently referred to IR for CT guided biopsy.  He presented to [facility] 12/05/15 for above procedure.  Procedure was performed successfully; however, was complicated by pneumothorax.  He had chest tube placed while in IR suite and PCCM was then called and asked to admit pt.  SUBJECTIVE:  Breathing comfortable at rest, watching TV in NAD.   VITAL SIGNS: Temp:  [97.5 F (36.4 C)-98.4 F (36.9 C)] 97.5 F (36.4 C) (09/13 0741) Pulse Rate:  [79-99] 79 (09/13 0741) Resp:  [15-26] 23 (09/13 0741) BP: (112-144)/(75-97) 124/84 (09/13 0741) SpO2:  [91 %-95 %] 91 % (09/13 0741) Weight:  [202 lb 6.1 oz (91.8 kg)] 202 lb 6.1 oz (91.8 kg) (09/13 0741)  PHYSICAL EXAMINATION: General: Adult male, resting watching TV,  in NAD. Neuro: A&O x 3, non-focal.  HEENT: [place]/AT. PERRL, sclerae anicteric. Cardiovascular: RRR, no M/R/G.  Lungs: Respirations even and unlabored.  Coarse bilaterally, with crackles throughout.  R anterior chest tube in place to water seal,no air leak Abdomen: BS x 4, soft, NT/ND.  Musculoskeletal: No gross deformities, no edema.  Skin: Intact, warm, no rashes.     Recent Labs Lab 12/06/15 0352  NA 139  K 4.1  CL 103  CO2 26  BUN 19  CREATININE 1.13  GLUCOSE 103*    Recent Labs Lab 12/05/15 0604 12/06/15 0352  HGB 14.0 12.8*  HCT 43.4 40.9  WBC 12.4* 12.3*  PLT 348 291   Ct Biopsy  Result Date: 12/05/2015 INDICATION:  History of pulmonary fibrosis, now with indeterminate right upper lobe hypermetabolic mass EXAM: 1. CT-GUIDED RIGHT UPPER LOBE PULMONARY MASS BIOPSY 2. CT-GUIDED RIGHT CHEST TUBE PLACEMENT COMPARISON:  PET-CT - 11/18/2015; chest CT- 11/11/2015 MEDICATIONS: None. ANESTHESIA/SEDATION: A total of Fentanyl 25 mcg IV; Versed 1 mg IV was administered for both the CT-guided lung biopsy and right-sided chest tube placement. Sedation time: 40 minutes; The patient was continuously monitored during the procedure by the interventional radiology nurse under my direct supervision. CONTRAST:  None COMPLICATIONS: SIR LEVEL D - Requires major therapy, prolonged hospitalization (>48 hours). Procedure complicated by development of a enlarging pneumothorax requiring right-sided chest tube placement. Patient remained hemodynamically stable throughout the procedure and right-sided chest tube placement. PROCEDURE: Informed consent was obtained from the patient following an explanation of the procedure, risks, benefits and alternatives. The patient understands,agrees and consents for the procedure. All questions were addressed. A time out was performed prior to the initiation of the procedure. The patient was positioned supine on the CT table and a limited chest CT was performed for procedural planning demonstrating unchanged size and appearance of the approximately 2.8 x 2.1 cm known hypermetabolic nodule within the right upper lobe (image 25, series 2). The operative site was prepped and draped in the usual sterile fashion. Under sterile conditions and local anesthesia, a 17 gauge coaxial needle was advanced into the peripheral aspect of the  nodule. Positioning was confirmed with intermittent CT fluoroscopy and followed by the acquisition of 3 core needle biopsies with an 18 gauge core needle biopsy device. Intra procedural CT imaging demonstrated the development of a slowly enlarging right-sided pneumothorax. As such, the coaxial needle  was cannulated with a short Amplatz wire which was coiled within the right pleural space. Appropriate positioning was confirmed and the coaxial needle was exchanged for a Yueh sheath needle. Attempts were made to aspirate the right-sided pneumothorax however the pneumothorax was noted to continue to expand on subsequent CT images. Given patient's advanced pulmonary fibrosis and lack of significant pulmonary reserve, the decision was made to place a right-sided chest tube. As such, the Yueh sheath needle was cannulated with an Amplatz wire which was coiled within the right pleural space. Appropriate positioning was confirmed with CT imaging. The tract was serially dilated allowing placement of a 10 French all-purpose drainage catheter with end coiled and locked within the cranial lateral aspect of the right lung apex. Postprocedural imaging confirmed appropriate positioning. The chest tube was secured at the skin entrance site within interrupted suture and connected to a pleural vac device. A dressing was placed. The patient otherwise tolerated the procedure well without immediate postprocedural complication. IMPRESSION: 1. Technically successful CT guided core needle core biopsy of hypermetabolic right upper lobe pulmonary nodule. 2. Procedure complicated by development of an asymptomatic though enlarging right-sided pneumothorax requiring chest tube placement. PLAN: Given patient's pulmonary fibrosis, the patient be admitted by the CCM service. Electronically Signed   By: Sandi Mariscal M.D.   On: 12/05/2015 11:35   Dg Chest Port 1 View  Result Date: 12/07/2015 CLINICAL DATA:  Former smoking history,  evaluate chest tubes EXAM: PORTABLE CHEST 1 VIEW COMPARISON:  Portable chest of 12/06/2015, and CT chest of 11/11/2015 FINDINGS: This little change in diffuse prominent interstitial markings consistent with severe interstitial lung disease when compared to the recent CT chest scan. The mass at the right lung base is  not as well seen by plain film but probably lies infrahilar in location. A right chest tube is unchanged and no pneumothorax is seen. Heart size is stable. IMPRESSION: 1. Right chest tube remains.  No pneumothorax is seen. 2. Severe fibrotic change throughout the lungs again noted. Electronically Signed   By: Ivar Drape M.D.   On: 12/07/2015 08:15   Dg Chest Port 1 View  Result Date: 12/06/2015 CLINICAL DATA:  Respiratory failure, status post lung biopsy with chest tube treatment of a pneumothorax. EXAM: PORTABLE CHEST 1 VIEW COMPARISON:  Portable chest x-ray of December 05, 2015 and chest CT scan of the same day. FINDINGS: Chronic pulmonary fibrotic changes are again observed. No definite pneumothorax on the right is visible. The small caliber right-sided chest tube is in stable position. The heart is top-normal in size. The pulmonary vascularity is not clearly engorged. There is calcification in the wall of the aortic arch. IMPRESSION: No pneumothorax is visible on today's study. Stable severe fibrotic change bilaterally. Aortic atherosclerosis. Electronically Signed   By: David  Martinique M.D.   On: 12/06/2015 08:00   Dg Chest Port 1 View  Result Date: 12/05/2015 CLINICAL DATA:  Status post small caliber chest tube placement. For post biopsy right-sided pneumothorax. EXAM: PORTABLE CHEST 1 VIEW COMPARISON:  CT scan of the chest of today's date at 8:49 a.m. FINDINGS: There has been placement of a small caliber chest tube in the right upper hemi thorax. The known right-sided pneumothorax is not clearly evident.  Extensive fibrotic changes are present throughout both lungs. The cardiac silhouette is enlarged. The pulmonary vascularity is prominent centrally. There is calcification in the wall of the aortic arch. The patient has undergone previous lower anterior cervical fusion. IMPRESSION: Interval placement of a small caliber right-sided chest tube. No definite pneumothorax is visible currently. Extensive  underlying pulmonary fibrotic changes. Aortic atherosclerosis. Electronically Signed   By: David  Martinique M.D.   On: 12/05/2015 11:54   Ct Perc Pleural Drain W/indwell Cath W/img Guide  Result Date: 12/05/2015 INDICATION: History of pulmonary fibrosis, now with indeterminate right upper lobe hypermetabolic mass EXAM: 1. CT-GUIDED RIGHT UPPER LOBE PULMONARY MASS BIOPSY 2. CT-GUIDED RIGHT CHEST TUBE PLACEMENT COMPARISON:  PET-CT - 11/18/2015; chest CT- 11/11/2015 MEDICATIONS: None. ANESTHESIA/SEDATION: A total of Fentanyl 25 mcg IV; Versed 1 mg IV was administered for both the CT-guided lung biopsy and right-sided chest tube placement. Sedation time: 40 minutes; The patient was continuously monitored during the procedure by the interventional radiology nurse under my direct supervision. CONTRAST:  None COMPLICATIONS: SIR LEVEL D - Requires major therapy, prolonged hospitalization (>48 hours). Procedure complicated by development of a enlarging pneumothorax requiring right-sided chest tube placement. Patient remained hemodynamically stable throughout the procedure and right-sided chest tube placement. PROCEDURE: Informed consent was obtained from the patient following an explanation of the procedure, risks, benefits and alternatives. The patient understands,agrees and consents for the procedure. All questions were addressed. A time out was performed prior to the initiation of the procedure. The patient was positioned supine on the CT table and a limited chest CT was performed for procedural planning demonstrating unchanged size and appearance of the approximately 2.8 x 2.1 cm known hypermetabolic nodule within the right upper lobe (image 25, series 2). The operative site was prepped and draped in the usual sterile fashion. Under sterile conditions and local anesthesia, a 17 gauge coaxial needle was advanced into the peripheral aspect of the nodule. Positioning was confirmed with intermittent CT fluoroscopy and  followed by the acquisition of 3 core needle biopsies with an 18 gauge core needle biopsy device. Intra procedural CT imaging demonstrated the development of a slowly enlarging right-sided pneumothorax. As such, the coaxial needle was cannulated with a short Amplatz wire which was coiled within the right pleural space. Appropriate positioning was confirmed and the coaxial needle was exchanged for a Yueh sheath needle. Attempts were made to aspirate the right-sided pneumothorax however the pneumothorax was noted to continue to expand on subsequent CT images. Given patient's advanced pulmonary fibrosis and lack of significant pulmonary reserve, the decision was made to place a right-sided chest tube. As such, the Yueh sheath needle was cannulated with an Amplatz wire which was coiled within the right pleural space. Appropriate positioning was confirmed with CT imaging. The tract was serially dilated allowing placement of a 10 French all-purpose drainage catheter with end coiled and locked within the cranial lateral aspect of the right lung apex. Postprocedural imaging confirmed appropriate positioning. The chest tube was secured at the skin entrance site within interrupted suture and connected to a pleural vac device. A dressing was placed. The patient otherwise tolerated the procedure well without immediate postprocedural complication. IMPRESSION: 1. Technically successful CT guided core needle core biopsy of hypermetabolic right upper lobe pulmonary nodule. 2. Procedure complicated by development of an asymptomatic though enlarging right-sided pneumothorax requiring chest tube placement. PLAN: Given patient's pulmonary fibrosis, the patient be admitted by the CCM service. Electronically Signed   By: Eldridge Abrahams.D.  On: 12/05/2015 11:35    STUDIES:  CXR 9/13 > R anterior chest tube in place. No pnx  SIGNIFICANT EVENTS  9/11 > to IR for CT guided bx of RUL mass > c/b PTX requiring chest tube placement >  admitted by PCCM.  ASSESSMENT / PLAN:  Pneumothorax - following CT guided RUL bx.>> Resolved per CXR 9/12 12/06/15 CXR indicates: no pneumothorax, stable severe fibrotic changes bilaterally No significant drainage per CT. No c/o pain or discomfort this am Plan: 9/13 dc ct per IR Monitor x 24 hours if stable dc home Pain control - PRN fentanyl. Incentive spirometry. 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 Chronic hypoxic respiratory failure - due to above. Plan: Continue cellcept, bactrim, albuterol,  Change Duo Nebs to PRN per patient request Continue supplemental O2 as needed to maintain SpO2 > 92%.   DM. CBG (last 3)   Recent Labs  12/05/15 2131 12/06/15 0804 12/06/15 1226  GLUCAP 134* 148* 164*     Plan: Continue preadmission metformin. CBG's to monitor  HTN, HLD. Plan: Continue preadmission losartan, pravastatin, ASA.  Richardson Landry Minor ACNP Maryanna Shape PCCM Pager [phone] till 3 pm If no answer page [phone] 12/07/2015, 9:44 AM  Attending Note:  65 year old male with ILD due to RA who presented for a biopsy, subsequently had a PTX and pig tail was placed with good response.  On exam, now there is no leak, clear lungs.  I reviewed CXR myself, no PTX noted, CT in place.  Discussed with PCCM-NP.  PTX:             - D/C CT.             - Repeat CXR in AM.             - Anticipate d/c 9/14 if no PTX in AM CXR.  IPF:              - Cellcept             . Bactrim.  DM:             - Metformin             - CBGs  HTN:             - Cozaar.  HLD:             - Pravachol.  Anticipate D/C in AM.  Patient seen and examined, agree with above note.  I dictated the care and orders written for this patient under my direction.  Rush Farmer, MD [phone]

## 2015-12-07 NOTE — Care Management Note (Addendum)
Case Management Note  Patient Details  Name: [name] MRN: [id number] Date of Birth: [birth year]  Subjective/Objective:   Patient lives at home alone, pta indep.  Presents for ct guided bx of lung,was complicated by  ptx , chest tube placed.  Patient has pcp, he has medication coverage with insurance and he has transport at discharge. Patient is on oxygen of 4 liters continously with [facility], patient has his own oxygen tank in his room with him.  Chest tube d'cd today, he is for cxr in am to make sure lung ok.  NCM will cont to follow for dc needs.      9/14- NCM called to try to make apt for patient with Dr. [name] at [facility] [phone], the calendar did not go out as far as two weeks, rep states to have patient to call on Monday to make apt for 9/26.  Also he will need to call [phone] to make pmt arrangements.  NCM dialed phone number for patient and he spoke with them.  Patient states they will set him up on a payment plan.                 Action/Plan:   Expected Discharge Date:                  Expected Discharge Plan:  Home/Self Care  In-House Referral:     Discharge planning Services  CM Consult  Post Acute Care Choice:    Choice offered to:     DME Arranged:    DME Agency:     HH Arranged:    HH Agency:     Status of Service:  Completed, signed off  If discussed at [employer] of Stay Meetings, dates discussed:    Additional Comments:  Zenon Mayo, RN 12/07/2015, 2:35 PM

## 2015-12-07 NOTE — Progress Notes (Signed)
Referring Physician(s): DR Tera Partridge  Supervising Physician: Aletta Edouard  Patient Status:  Inpatient  Chief Complaint:  Right lung mass biopsy 12/05/15 Post procedure PTX requiring chest tube   Subjective:  Admitted to CCM Pt is asymptomatic Resting' Eating normal diet No complaints Water  sealed Chest tube 9/12 am CXR today: IMPRESSION: 1. Right chest tube remains.  No pneumothorax is seen. 2. Severe fibrotic change throughout the lungs again noted.  Request for chest tube removal per CCM Dr Kathlene Cote has reviewed imaging No air leak Approves removal  Allergies: Review of patient's allergies indicates no known allergies.  Medications: Prior to Admission medications   Medication Sig Start Date End Date Taking? Authorizing Provider  albuterol (VENTOLIN HFA) 108 (90 BASE) MCG/ACT inhaler Inhale 2 puffs into the lungs every 4 (four) hours as needed for wheezing or shortness of breath. 11/20/13  Yes Shawnee Knapp, MD  aspirin 81 MG tablet Take 81 mg by mouth daily.   Yes Historical Provider, MD  buPROPion (WELLBUTRIN XL) 150 MG 24 hr tablet Take 1 tab po qd, then increase to 2 tabs in the morning in 2 weeks if needed. Patient taking differently: Take 300 mg by mouth daily.  11/10/15  Yes Shawnee Knapp, MD  gabapentin (NEURONTIN) 400 MG capsule Take 2 capsules (800 mg total) by mouth 3 (three) times daily. 03/16/15  Yes Shawnee Knapp, MD  ipratropium-albuterol (DUONEB) 0.5-2.5 (3) MG/3ML SOLN TAKE 3 MLS BY NEBULIZATION EVERY 4 (FOUR) HOURS AS NEEDED. Patient taking differently: TAKE 3 MLS BY NEBULIZATION EVERY 4 (FOUR) HOURS AS NEEDED FOR WHEEZING 09/27/15  Yes Shawnee Knapp, MD  losartan (COZAAR) 50 MG tablet TAKE 1 TABLET BY MOUTH EVERY DAY FOR BLOOD PRESSURE 11/25/15  Yes Shawnee Knapp, MD  meloxicam (MOBIC) 7.5 MG tablet Take 1 tablet (7.5 mg total) by mouth daily as needed for pain. Do not use with any other otc pain medication other than acetaminophen 03/18/15  Yes Shawnee Knapp,  MD  metFORMIN (GLUCOPHAGE) 500 MG tablet TAKE 1 TABLET (500 MG TOTAL) BY MOUTH 2 (TWO) TIMES DAILY WITH A MEAL. FOR DIABETES 09/24/15  Yes Shawnee Knapp, MD  mycophenolate (CELLCEPT) 500 MG tablet TAKE 2 TABLETS TWICE DAILY 1 HOUR BEFORE OR 2 HOURS  AFTER  MEAL 08/23/15  Yes Brand Males, MD  ONE TOUCH ULTRA TEST test strip TEST BLOOD SUGAR DAILY. DX CODE: E11.65 05/29/15  Yes Shawnee Knapp, MD  pravastatin (PRAVACHOL) 40 MG tablet TAKE 1 TABLET (40 MG TOTAL) BY MOUTH AT BEDTIME. FOR CHOLESTEROL 10/26/15  Yes Mancel Bale, PA-C  sulfamethoxazole-trimethoprim (BACTRIM DS,SEPTRA DS) 800-160 MG tablet TAKE 1 TABLET BY MOUTH THREE TIMES A WEEK (NEED APPT FOR MORE REFILLS!!) 10/24/15  Yes Brand Males, MD     Vital Signs: BP 124/84 (BP Location: Right Arm)   Pulse 79   Temp 97.5 F (36.4 C) (Oral)   Resp (!) 23   Ht '5\' 9"'$  (1.753 m)   Wt 203 lb 11.3 oz (92.4 kg)   SpO2 91%   BMI 30.08 kg/m   Physical Exam  Constitutional: He is oriented to person, place, and time.  Pulmonary/Chest: Effort normal and breath sounds normal.  Musculoskeletal: Normal range of motion.  Neurological: He is alert and oriented to person, place, and time.  Skin: Skin is warm and dry.  Chest tube removal without complication vaseline gauze dressing placed CXR pending  Nursing note and vitals reviewed.   Imaging: Ct Biopsy  Result Date: 12/05/2015 INDICATION: History of pulmonary fibrosis, now with indeterminate right upper lobe hypermetabolic mass EXAM: 1. CT-GUIDED RIGHT UPPER LOBE PULMONARY MASS BIOPSY 2. CT-GUIDED RIGHT CHEST TUBE PLACEMENT COMPARISON:  PET-CT - 11/18/2015; chest CT- 11/11/2015 MEDICATIONS: None. ANESTHESIA/SEDATION: A total of Fentanyl 25 mcg IV; Versed 1 mg IV was administered for both the CT-guided lung biopsy and right-sided chest tube placement. Sedation time: 40 minutes; The patient was continuously monitored during the procedure by the interventional radiology nurse under my direct  supervision. CONTRAST:  None COMPLICATIONS: SIR LEVEL D - Requires major therapy, prolonged hospitalization (>48 hours). Procedure complicated by development of a enlarging pneumothorax requiring right-sided chest tube placement. Patient remained hemodynamically stable throughout the procedure and right-sided chest tube placement. PROCEDURE: Informed consent was obtained from the patient following an explanation of the procedure, risks, benefits and alternatives. The patient understands,agrees and consents for the procedure. All questions were addressed. A time out was performed prior to the initiation of the procedure. The patient was positioned supine on the CT table and a limited chest CT was performed for procedural planning demonstrating unchanged size and appearance of the approximately 2.8 x 2.1 cm known hypermetabolic nodule within the right upper lobe (image 25, series 2). The operative site was prepped and draped in the usual sterile fashion. Under sterile conditions and local anesthesia, a 17 gauge coaxial needle was advanced into the peripheral aspect of the nodule. Positioning was confirmed with intermittent CT fluoroscopy and followed by the acquisition of 3 core needle biopsies with an 18 gauge core needle biopsy device. Intra procedural CT imaging demonstrated the development of a slowly enlarging right-sided pneumothorax. As such, the coaxial needle was cannulated with a short Amplatz wire which was coiled within the right pleural space. Appropriate positioning was confirmed and the coaxial needle was exchanged for a Yueh sheath needle. Attempts were made to aspirate the right-sided pneumothorax however the pneumothorax was noted to continue to expand on subsequent CT images. Given patient's advanced pulmonary fibrosis and lack of significant pulmonary reserve, the decision was made to place a right-sided chest tube. As such, the Yueh sheath needle was cannulated with an Amplatz wire which was  coiled within the right pleural space. Appropriate positioning was confirmed with CT imaging. The tract was serially dilated allowing placement of a 10 French all-purpose drainage catheter with end coiled and locked within the cranial lateral aspect of the right lung apex. Postprocedural imaging confirmed appropriate positioning. The chest tube was secured at the skin entrance site within interrupted suture and connected to a pleural vac device. A dressing was placed. The patient otherwise tolerated the procedure well without immediate postprocedural complication. IMPRESSION: 1. Technically successful CT guided core needle core biopsy of hypermetabolic right upper lobe pulmonary nodule. 2. Procedure complicated by development of an asymptomatic though enlarging right-sided pneumothorax requiring chest tube placement. PLAN: Given patient's pulmonary fibrosis, the patient be admitted by the CCM service. Electronically Signed   By: Sandi Mariscal M.D.   On: 12/05/2015 11:35   Dg Chest Port 1 View  Result Date: 12/07/2015 CLINICAL DATA:  Former smoking history,  evaluate chest tubes EXAM: PORTABLE CHEST 1 VIEW COMPARISON:  Portable chest of 12/06/2015, and CT chest of 11/11/2015 FINDINGS: This little change in diffuse prominent interstitial markings consistent with severe interstitial lung disease when compared to the recent CT chest scan. The mass at the right lung base is not as well seen by plain film but probably lies infrahilar in location. A right  chest tube is unchanged and no pneumothorax is seen. Heart size is stable. IMPRESSION: 1. Right chest tube remains.  No pneumothorax is seen. 2. Severe fibrotic change throughout the lungs again noted. Electronically Signed   By: Ivar Drape M.D.   On: 12/07/2015 08:15   Dg Chest Port 1 View  Result Date: 12/06/2015 CLINICAL DATA:  Respiratory failure, status post lung biopsy with chest tube treatment of a pneumothorax. EXAM: PORTABLE CHEST 1 VIEW COMPARISON:   Portable chest x-ray of December 05, 2015 and chest CT scan of the same day. FINDINGS: Chronic pulmonary fibrotic changes are again observed. No definite pneumothorax on the right is visible. The small caliber right-sided chest tube is in stable position. The heart is top-normal in size. The pulmonary vascularity is not clearly engorged. There is calcification in the wall of the aortic arch. IMPRESSION: No pneumothorax is visible on today's study. Stable severe fibrotic change bilaterally. Aortic atherosclerosis. Electronically Signed   By: David  Martinique M.D.   On: 12/06/2015 08:00   Dg Chest Port 1 View  Result Date: 12/05/2015 CLINICAL DATA:  Status post small caliber chest tube placement. For post biopsy right-sided pneumothorax. EXAM: PORTABLE CHEST 1 VIEW COMPARISON:  CT scan of the chest of today's date at 8:49 a.m. FINDINGS: There has been placement of a small caliber chest tube in the right upper hemi thorax. The known right-sided pneumothorax is not clearly evident. Extensive fibrotic changes are present throughout both lungs. The cardiac silhouette is enlarged. The pulmonary vascularity is prominent centrally. There is calcification in the wall of the aortic arch. The patient has undergone previous lower anterior cervical fusion. IMPRESSION: Interval placement of a small caliber right-sided chest tube. No definite pneumothorax is visible currently. Extensive underlying pulmonary fibrotic changes. Aortic atherosclerosis. Electronically Signed   By: David  Martinique M.D.   On: 12/05/2015 11:54   Ct Perc Pleural Drain W/indwell Cath W/img Guide  Result Date: 12/05/2015 INDICATION: History of pulmonary fibrosis, now with indeterminate right upper lobe hypermetabolic mass EXAM: 1. CT-GUIDED RIGHT UPPER LOBE PULMONARY MASS BIOPSY 2. CT-GUIDED RIGHT CHEST TUBE PLACEMENT COMPARISON:  PET-CT - 11/18/2015; chest CT- 11/11/2015 MEDICATIONS: None. ANESTHESIA/SEDATION: A total of Fentanyl 25 mcg IV; Versed 1 mg  IV was administered for both the CT-guided lung biopsy and right-sided chest tube placement. Sedation time: 40 minutes; The patient was continuously monitored during the procedure by the interventional radiology nurse under my direct supervision. CONTRAST:  None COMPLICATIONS: SIR LEVEL D - Requires major therapy, prolonged hospitalization (>48 hours). Procedure complicated by development of a enlarging pneumothorax requiring right-sided chest tube placement. Patient remained hemodynamically stable throughout the procedure and right-sided chest tube placement. PROCEDURE: Informed consent was obtained from the patient following an explanation of the procedure, risks, benefits and alternatives. The patient understands,agrees and consents for the procedure. All questions were addressed. A time out was performed prior to the initiation of the procedure. The patient was positioned supine on the CT table and a limited chest CT was performed for procedural planning demonstrating unchanged size and appearance of the approximately 2.8 x 2.1 cm known hypermetabolic nodule within the right upper lobe (image 25, series 2). The operative site was prepped and draped in the usual sterile fashion. Under sterile conditions and local anesthesia, a 17 gauge coaxial needle was advanced into the peripheral aspect of the nodule. Positioning was confirmed with intermittent CT fluoroscopy and followed by the acquisition of 3 core needle biopsies with an 18 gauge core needle biopsy device.  Intra procedural CT imaging demonstrated the development of a slowly enlarging right-sided pneumothorax. As such, the coaxial needle was cannulated with a short Amplatz wire which was coiled within the right pleural space. Appropriate positioning was confirmed and the coaxial needle was exchanged for a Yueh sheath needle. Attempts were made to aspirate the right-sided pneumothorax however the pneumothorax was noted to continue to expand on subsequent CT  images. Given patient's advanced pulmonary fibrosis and lack of significant pulmonary reserve, the decision was made to place a right-sided chest tube. As such, the Yueh sheath needle was cannulated with an Amplatz wire which was coiled within the right pleural space. Appropriate positioning was confirmed with CT imaging. The tract was serially dilated allowing placement of a 10 French all-purpose drainage catheter with end coiled and locked within the cranial lateral aspect of the right lung apex. Postprocedural imaging confirmed appropriate positioning. The chest tube was secured at the skin entrance site within interrupted suture and connected to a pleural vac device. A dressing was placed. The patient otherwise tolerated the procedure well without immediate postprocedural complication. IMPRESSION: 1. Technically successful CT guided core needle core biopsy of hypermetabolic right upper lobe pulmonary nodule. 2. Procedure complicated by development of an asymptomatic though enlarging right-sided pneumothorax requiring chest tube placement. PLAN: Given patient's pulmonary fibrosis, the patient be admitted by the CCM service. Electronically Signed   By: Sandi Mariscal M.D.   On: 12/05/2015 11:35    Labs:  CBC:  Recent Labs  01/20/15 1016 05/12/15 0947 11/10/15 0933 12/05/15 0604 12/06/15 0352  WBC 15.3* 17.9* 12.3* 12.4* 12.3*  HGB 13.8 15.1 14.2 14.0 12.8*  HCT 41.5 42.8* 42.8 43.4 40.9  PLT 397  --  356 348 291    COAGS:  Recent Labs  12/05/15 0604  INR 1.01  APTT 28    BMP:  Recent Labs  01/20/15 1016 05/12/15 0927 11/10/15 0933 12/06/15 0352  NA 136 136 139 139  K 4.9 4.3 4.5 4.1  CL 103 103 104 103  CO2 '22 22 20 26  '$ GLUCOSE 95 135* 87 103*  BUN '23 24 21 19  '$ CALCIUM 8.9 9.4 9.5 8.8*  CREATININE 1.13 1.24 1.07 1.13  GFRNONAA  --   --  72 >60  GFRAA  --   --  83 >60    LIVER FUNCTION TESTS:  Recent Labs  01/20/15 1016 05/12/15 0927 11/10/15 0933  BILITOT 0.4  0.3 0.4  AST '14 13 15  '$ ALT '13 10 14  '$ ALKPHOS 102 119* 104  PROT 7.4 7.8 7.7  ALBUMIN 4.0 4.2 4.1    Assessment and Plan:  Right lung mass bx 9/11 Post procedure ptx requiring chest tube placement Asymptomatic CXR without PTX after 24 hrs water seal No air leak Chest tube removal per Dr Kathlene Cote Vaseline gauze dressing placed  Electronically Signed: Monia Sabal A 12/07/2015, 9:29 AM   I spent a total of 25 Minutes at the the patient's bedside AND on the patient's hospital floor or unit, greater than 50% of which was counseling/coordinating care for Rt chest tube/PTX

## 2015-12-07 NOTE — Progress Notes (Signed)
eLink Physician-Brief Progress Note Patient Name: [name] DOB: September 26, 1950 MRN: [id number]   Date of Service  12/07/2015  HPI/Events of Note  RN notes no admission order on chart, admitted on 9/12 for pneumothorax  eICU Interventions  Place order for admission as on ground team currently busy with emergent airway procedure on separate patient     Intervention Category Minor Interventions: Routine modifications to care plan (e.g. PRN medications for pain, fever)  Simonne Maffucci 12/07/2015, 4:09 PM

## 2015-12-08 DIAGNOSIS — R0902 Hypoxemia: Secondary | ICD-10-CM

## 2015-12-08 DIAGNOSIS — J95811 Postprocedural pneumothorax: Principal | ICD-10-CM

## 2015-12-08 DIAGNOSIS — J84112 Idiopathic pulmonary fibrosis: Secondary | ICD-10-CM

## 2015-12-08 LAB — GLUCOSE, CAPILLARY
GLUCOSE-CAPILLARY: 81 mg/dL (ref 65–99)
GLUCOSE-CAPILLARY: 153 mg/dL — AB (ref 65–99)
GLUCOSE-CAPILLARY: 127 mg/dL — AB (ref 65–99)

## 2015-12-08 MED ORDER — BUPROPION HCL ER (XL) 150 MG PO TB24: 300.0000 mg | ORAL_TABLET | Freq: Every day | ORAL | Status: AC

## 2015-12-08 NOTE — Care Management Important Message (Signed)
Important Message  Patient Details  Name: [name] MRN: [id number] Date of Birth: 1950/08/14   Medicare Important Message Given:  Other (see comment)    [place], Desi Carby Abena 12/08/2015, 10:57 AM

## 2015-12-08 NOTE — Progress Notes (Signed)
Discussed discharge instructions and medications with patient. Patient verbalized understanding with all questions answered. VSS. Follow up appointments are set. Pt discharged home with friend.  Ms [facility]

## 2015-12-08 NOTE — Progress Notes (Signed)
Changed chest tube dressing.

## 2015-12-08 NOTE — Discharge Summary (Signed)
Physician Discharge Summary  Patient ID: [name] MRN: [id number] DOB/AGE: April 11, 1950 65 y.o.  Admit date: 12/05/2015 Discharge date: 12/08/2015    Discharge Diagnoses:  Active Problems:   Pneumothorax, traumatic   Pneumothorax after biopsy                                                       D/c plan by Discharge Diagnosis   Brief Summary: [name] is a 65 y.o. 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  He is followed in our clinic by Dr. Chase Caller (previously saw Dr. Randol Kern at [facility] clinic but has not seen him since 2013).  Seen as outpatient Aug 2017 with SOB and had PET CT which demonstrated hypermetabolic RUL mass.  He was subsequently referred to IR for CT guided biopsy.   He presented to [facility] 12/05/15 for above procedure.  Procedure was performed successfully; however, was complicated by pneumothorax.  He had RIGHT chest tube placed while in IR suite and PCCM was then called and asked to admit pt. Chest tube remained in place 9/11-9/13.  Post removal, CXR shows no evidence of ptx.  He is near baseline respiratory status, requiring his home 4L [place] and resting comfortably.  He is medically cleared for d/c home with outpt pulmonary follow up scheduled 9/18 with CXR and review of pathology results.   Consults: IR   STUDIES:  CXR 9/13 > R anterior chest tube in place. No pnx  SIGNIFICANT EVENTS  9/11 > to IR for CT guided bx of RUL mass > c/b PTX requiring chest tube placement > admitted by PCCM.     Vitals:   12/08/15 0001 12/08/15 0002 12/08/15 0400 12/08/15 0838  BP:  111/80 117/81 118/82  Pulse:  (!) 107 (!) 110 94  Resp:  (!) 27 (!) 26 (!) 25  Temp: 98 F (36.7 C)  97.5 F (36.4 C) 98 F (36.7 C)  TempSrc: Oral  Oral Oral  SpO2:  (!) 86% (!) 87% (!) 84%  Weight:      Height:         Discharge Labs  BMET  Recent Labs Lab 12/06/15 0352  NA 139  K 4.1  CL 103  CO2 26  GLUCOSE 103*  BUN 19   CREATININE 1.13  CALCIUM 8.8*  MG 1.7  PHOS 3.7     CBC   Recent Labs Lab 12/05/15 0604 12/06/15 0352  HGB 14.0 12.8*  HCT 43.4 40.9  WBC 12.4* 12.3*  PLT 348 291   Anti-Coagulation  Recent Labs Lab 12/05/15 0604  INR 1.01         Follow-up Information    RAMASWAMY,MURALI, MD Follow up on 12/12/2015.   Specialty:  Pulmonary Disease Why:  10:30am with chest xray  Contact information: [address] [place]  [address] [phone]        SHAW,EVA, MD. Schedule an appointment as soon as possible for a visit in 2 week(s).   Specialty:  Family Medicine Contact information: [place] [address] [phone]              Medication List    TAKE these medications   albuterol 108 (90 Base) MCG/ACT inhaler Commonly known as:  VENTOLIN HFA Inhale 2 puffs into the  lungs every 4 (four) hours as needed for wheezing or shortness of breath.   aspirin 81 MG tablet Take 81 mg by mouth daily.   buPROPion 150 MG 24 hr tablet Commonly known as:  WELLBUTRIN XL Take 2 tablets (300 mg total) by mouth daily.   gabapentin 400 MG capsule Commonly known as:  NEURONTIN Take 2 capsules (800 mg total) by mouth 3 (three) times daily.   ipratropium-albuterol 0.5-2.5 (3) MG/3ML Soln Commonly known as:  DUONEB TAKE 3 MLS BY NEBULIZATION EVERY 4 (FOUR) HOURS AS NEEDED. What changed:  See the new instructions.   losartan 50 MG tablet Commonly known as:  COZAAR TAKE 1 TABLET BY MOUTH EVERY DAY FOR BLOOD PRESSURE   meloxicam 7.5 MG tablet Commonly known as:  MOBIC Take 1 tablet (7.5 mg total) by mouth daily as needed for pain. Do not use with any other otc pain medication other than acetaminophen   metFORMIN 500 MG tablet Commonly known as:  GLUCOPHAGE TAKE 1 TABLET (500 MG TOTAL) BY MOUTH 2 (TWO) TIMES DAILY WITH A MEAL. FOR DIABETES   mycophenolate 500 MG tablet Commonly known as:  CELLCEPT TAKE 2 TABLETS TWICE DAILY 1 HOUR BEFORE OR 2  HOURS  AFTER  MEAL   ONE TOUCH ULTRA TEST test strip Generic drug:  glucose blood TEST BLOOD SUGAR DAILY. DX CODE: E11.65   pravastatin 40 MG tablet Commonly known as:  PRAVACHOL TAKE 1 TABLET (40 MG TOTAL) BY MOUTH AT BEDTIME. FOR CHOLESTEROL   sulfamethoxazole-trimethoprim 800-160 MG tablet Commonly known as:  BACTRIM DS,SEPTRA DS TAKE 1 TABLET BY MOUTH THREE TIMES A WEEK (NEED APPT FOR MORE REFILLS!!)         Disposition: 01-Home or Self Care  Discharged Condition: [name] has met maximum benefit of inpatient care and is medically stable and cleared for discharge.  Patient is pending follow up as above.      Time spent on disposition:  Greater than 35 minutes.   Signed: Nickolas Madrid, NP 12/08/2015  9:45 AM Pager: [phone] or [phone]  Attending Note:  Patient stable this AM, wants to go home, lungs are clear on exam, I reviewed CXR post CT removal and no recurrence of PTX.  Patient ready for discharge, discussed with PCCM-NP who will f/u on discharge instruction.  F/U on Monday.  Patient seen and examined, agree with above note.  I dictated the care and orders written for this patient under my direction.  Rush Farmer, MD [phone]

## 2015-12-12 ENCOUNTER — Ambulatory Visit (INDEPENDENT_AMBULATORY_CARE_PROVIDER_SITE_OTHER): Payer: Medicare Other | Admitting: Internal Medicine

## 2015-12-12 ENCOUNTER — Encounter: Payer: Self-pay | Admitting: Internal Medicine

## 2015-12-12 ENCOUNTER — Ambulatory Visit (INDEPENDENT_AMBULATORY_CARE_PROVIDER_SITE_OTHER)
Admission: RE | Admit: 2015-12-12 | Discharge: 2015-12-12 | Disposition: A | Discharge status: Home / Self Care | Payer: Medicare Other | Source: Ambulatory Visit | Attending: Internal Medicine | Admitting: Internal Medicine

## 2015-12-12 VITALS — BP 140/72 | HR 114 | Ht 69.0 in | Wt 200.6 lb

## 2015-12-12 DIAGNOSIS — J939 Pneumothorax, unspecified: Secondary | ICD-10-CM

## 2015-12-12 DIAGNOSIS — J9611 Chronic respiratory failure with hypoxia: Secondary | ICD-10-CM

## 2015-12-12 DIAGNOSIS — R918 Other nonspecific abnormal finding of lung field: Secondary | ICD-10-CM

## 2015-12-12 DIAGNOSIS — R911 Solitary pulmonary nodule: Secondary | ICD-10-CM | POA: Insufficient documentation

## 2015-12-12 NOTE — Assessment & Plan Note (Signed)
Continue wearing your oxygen for saturations > 92%

## 2015-12-12 NOTE — Assessment & Plan Note (Signed)
Pulmonary Nodule with hypermetabolic activity per PET scan. Needle Biopsy: 12/05/2015>> no evidence malignancy Pneumothorax post procedure 9/11 requiring hospitalization Plan: Continue wearing your oxygen as needed for saturations > 92% Continue taking your Cellcept as you have been doing. Your biopsy did not show any malignancy. In order to further work up the nodule we will schedule you for a CT chest without contrast 02/18/2016.( three months after PET scan done 11/18/2015) Follow up with Dr. Chase Caller NP after repeat CT scan. Please contact office for sooner follow up if symptoms do not improve or worsen or seek emergency care

## 2015-12-12 NOTE — Patient Instructions (Addendum)
It is nice to see you again. Continue wearing your oxygen as needed for saturations > 92% Continue taking your Cellcept as you have been doing. Your biopsy did not show any malignancy. In order to further work up the nodule we will schedule you for a CT chest without contrast 02/18/2016. Follow up with Dr. Chase Caller after repeat CT scan. Please contact office for sooner follow up if symptoms do not improve or worsen or seek emergency care

## 2015-12-12 NOTE — Progress Notes (Signed)
History of Present Illness [name] is a 65 y.o. male former smoker  with RUL Mass,  Rheumatoid arthritis, Post Inflammatory ILD, and chronic hypoxic respiratory failure, currently treated with Cellcept and  followed by Dr. Chase Caller.   12/12/2015 Follow Up for Biopsy results. Pt. Returns to the office for results of biopsy done 12/05/2015. Results showed Scant benign lung parenchyma with focal areas of fibrosis No Malignancy identified.He did have a pneumothorax as a result of his needle biopsy requiring a hospital admission.Marland Kitchen He was discharged form the hospital 12/08/15. He is currently wearing oxygen at 4L [place].He continues to have desaturations with exertion. His chest tube site had completely healed, no drainage, redness or pain. He denies chest pain,cough, fever, orthopnea or hemoptysis. We did discuss that this was a non-diagnostic result and that there may be need for further diagnostic work up of his pulmonary nodule.He states he is at baseline for his breathing.   Tests Needle Biopsy of RUL nodule>>>12/05/2015 Scant benign lung parenchyma with focal areas of fibrosis No Malignancy identified.  PET scan >>11/18/2015>> Abnormal hypermetabolic lesion, maximum standard uptake value 10.8, associated with the right upper lobe nodule. The hypermetabolic activity measures 2.0 by 1.7 cm in this vicinity, and there is some distal atelectasis associated with the lesion. Appearance favors malignancy, active granulomatous infectious process is a less likely differential diagnostic consideration. Assuming non-small cell lung cancer, and assuming that the faint hypermetabolic activity in the right paratracheal node does represent early metastatic disease, and assuming that the findings in the neck are unrelated, appearance favors T1 N2 M0 disease (stage IIIA). 2. Mildly hypermetabolic right lower paratracheal lymph nodes. Early metastatic disease to these ipsilateral lymph nodes not  excluded. 3. 1.2 cm focus of hypermetabolic activity along the posterior superior nasopharynx, maximum SUV 8.5, indeterminate for malignancy versus focal lymphoid activation, may merit otolaryngology referral. There is also a small hypermetabolic lymph node along the posterior inferior margin of the left parotid gland which merits observation.  Past medical hx Past Medical History:  Diagnosis Date  . Diabetes mellitus without complication ([place])   . Dyspnea   . GERD (gastroesophageal reflux disease)   . Hypertension   . Pulmonary fibrosis ([place])   . Rheumatoid arthritis(714.0) 2005     Past surgical hx, Family hx, Social hx all reviewed.  Current Outpatient Prescriptions on File Prior to Visit  Medication Sig  . albuterol (VENTOLIN HFA) 108 (90 BASE) MCG/ACT inhaler Inhale 2 puffs into the lungs every 4 (four) hours as needed for wheezing or shortness of breath.  Marland Kitchen aspirin 81 MG tablet Take 81 mg by mouth daily.  Marland Kitchen buPROPion (WELLBUTRIN XL) 150 MG 24 hr tablet Take 2 tablets (300 mg total) by mouth daily.  Marland Kitchen gabapentin (NEURONTIN) 400 MG capsule Take 2 capsules (800 mg total) by mouth 3 (three) times daily.  Marland Kitchen ipratropium-albuterol (DUONEB) 0.5-2.5 (3) MG/3ML SOLN TAKE 3 MLS BY NEBULIZATION EVERY 4 (FOUR) HOURS AS NEEDED. (Patient taking differently: TAKE 3 MLS BY NEBULIZATION EVERY 4 (FOUR) HOURS AS NEEDED FOR WHEEZING)  . losartan (COZAAR) 50 MG tablet TAKE 1 TABLET BY MOUTH EVERY DAY FOR BLOOD PRESSURE  . meloxicam (MOBIC) 7.5 MG tablet Take 1 tablet (7.5 mg total) by mouth daily as needed for pain. Do not use with any other otc pain medication other than acetaminophen  . metFORMIN (GLUCOPHAGE) 500 MG tablet TAKE 1 TABLET (500 MG TOTAL) BY MOUTH 2 (TWO) TIMES DAILY WITH A MEAL. FOR DIABETES  . mycophenolate (CELLCEPT)  500 MG tablet TAKE 2 TABLETS TWICE DAILY 1 HOUR BEFORE OR 2 HOURS  AFTER  MEAL  . ONE TOUCH ULTRA TEST test strip TEST BLOOD SUGAR DAILY. DX CODE: E11.65  . pravastatin  (PRAVACHOL) 40 MG tablet TAKE 1 TABLET (40 MG TOTAL) BY MOUTH AT BEDTIME. FOR CHOLESTEROL  . sulfamethoxazole-trimethoprim (BACTRIM DS,SEPTRA DS) 800-160 MG tablet TAKE 1 TABLET BY MOUTH THREE TIMES A WEEK (NEED APPT FOR MORE REFILLS!!)   No current facility-administered medications on file prior to visit.      No Known Allergies  Review Of Systems:  Constitutional:   No  weight loss, night sweats,  Fevers, chills, fatigue, or  lassitude.  HEENT:   No headaches,  Difficulty swallowing,  Tooth/dental problems, or  Sore throat,                No sneezing, itching, ear ache, nasal congestion, post nasal drip,   CV:  No chest pain,  Orthopnea, PND, swelling in lower extremities, anasarca, dizziness, palpitations, syncope.   GI  No heartburn, indigestion, abdominal pain, nausea, vomiting, diarrhea, change in bowel habits, loss of appetite, bloody stools.   Resp: + shortness of breath with exertion less at rest.  No excess mucus, no productive cough,  No non-productive cough,  No coughing up of blood.  No change in color of mucus.  No wheezing.  No chest wall deformity  Skin: no rash or lesions.  GU: no dysuria, change in color of urine, no urgency or frequency.  No flank pain, no hematuria   MS:  No joint pain or swelling.  No decreased range of motion.  No back pain.  Psych:  No change in mood or affect. No depression or anxiety.  No memory loss.   Vital Signs BP 140/72 (BP Location: Left Arm, Cuff Size: Normal)   Pulse (!) 114   Ht '5\' 9"'$  (1.753 m)   Wt 200 lb 9.6 oz (91 kg)   SpO2 90%   BMI 29.62 kg/m    Physical Exam:  General- No distress,  A&Ox3, poor hygiene, malodorous ( [place]) ENT: No sinus tenderness, TM clear, pale nasal mucosa, no oral exudate,no post nasal drip, no LAN Cardiac: S1, S2, regular rate and rhythm, no murmur Chest: No wheeze/ rales/ dullness; no accessory muscle use, no nasal flaring, no sternal retractions Abd.: Soft Non-tender, obese Ext: No clubbing  cyanosis, edema Neuro:  normal strength Skin: No rashes, warm and dry, anterior chest tube site completely healed. Psych: normal mood and behavior   Assessment/Plan  Solitary pulmonary nodule Pulmonary Nodule with hypermetabolic activity per PET scan. Needle Biopsy: 12/05/2015>> no evidence malignancy Pneumothorax post procedure 9/11 requiring hospitalization Plan: Continue wearing your oxygen as needed for saturations > 92% Continue taking your Cellcept as you have been doing. Your biopsy did not show any malignancy. In order to further work up the nodule we will schedule you for a CT chest without contrast 02/18/2016.( three months after PET scan done 11/18/2015) Follow up with Dr. Chase Caller NP after repeat CT scan. Please contact office for sooner follow up if symptoms do not improve or worsen or seek emergency care    Chronic hypoxemic respiratory failure ([place]) Continue wearing your oxygen for saturations > 92%    Magdalen Spatz, NP 12/12/2015  11:49 AM    STAFF NOTE: I, Dr Ann Lions have personally reviewed patient's available data, including medical history, events of note, physical examination and test results as part of my evaluation. I have  discussed with resident/NP and other care providers such as pharmacist, RN and RRT.  In addition,  I personally evaluated patient and elicited key findings of   S: here to reviewe lung mass RUL periphery CT guided TTNA bx from 12/05/15. Course was complicated by PTx and stay in ICU. Currently feels back to baseline. He wishes to know his prognosis     Pmhx: RA, cellcept,  reports that he quit smoking about 7 years ago. He started smoking about 46 years ago. He has a 19.50 pack-year smoking history. He has never used smokeless tobacco.   O: bad odor body and clothes - worse than before On o2 Oriented x 3 Stoic as always  9/11/7 - biopsy - non-diagnstocic, fibrous tissue - no granuloma 12/12/15 cxr - no ptx  A: rt lung mass -  ddx was lung cancer, opportunitis infection, rheumatoid nodule in t his patient with RA, on cellcept and former smoker - Bx non-diagnsotic and complicated by severe ptx  P: ENB indicated but risky and he feels he does not want to take risk -> serial CT we will do. Might consider percepta test in future   .  Rest per NP/medical resident whose note is outlined above and that I agree with   Dr. Brand Males, M.D., [facility].C.P Pulmonary and Critical Care Medicine Staff Physician [place] [facility] Pager: [phone], If no answer or between  15:00h - 7:00h: call [phone]  12/12/2015 4:04 PM

## 2015-12-15 ENCOUNTER — Other Ambulatory Visit: Payer: Self-pay

## 2015-12-15 MED ORDER — BAYER CONTOUR MONITOR W/DEVICE KIT: PACK | 0 refills | Status: AC

## 2015-12-15 MED ORDER — GLUCOSE BLOOD VI STRP: ORAL_STRIP | 12 refills | Status: AC

## 2016-02-14 ENCOUNTER — Other Ambulatory Visit: Payer: Self-pay | Admitting: Internal Medicine

## 2016-02-19 ENCOUNTER — Other Ambulatory Visit: Payer: Self-pay | Admitting: Physician Assistant

## 2016-02-20 ENCOUNTER — Ambulatory Visit (INDEPENDENT_AMBULATORY_CARE_PROVIDER_SITE_OTHER)
Admission: RE | Admit: 2016-02-20 | Discharge: 2016-02-20 | Disposition: A | Discharge status: Home / Self Care | Payer: Medicare Other | Source: Ambulatory Visit | Attending: Internal Medicine | Admitting: Internal Medicine

## 2016-02-20 DIAGNOSIS — R918 Other nonspecific abnormal finding of lung field: Secondary | ICD-10-CM | POA: Diagnosis not present

## 2016-02-28 ENCOUNTER — Ambulatory Visit: Payer: Medicare Other | Admitting: Internal Medicine

## 2016-03-15 ENCOUNTER — Other Ambulatory Visit: Payer: Self-pay | Admitting: Family Medicine

## 2016-03-21 ENCOUNTER — Telehealth: Payer: Self-pay | Admitting: Internal Medicine

## 2016-03-21 NOTE — Telephone Encounter (Signed)
[name]  Repeat ct chest `02/20/16 shows RUL mass enlarging. Can you guive him first avail to discuss please? Might have to consider ENB with RB  Dr. Brand Males, M.D., [facility].C.P Pulmonary and Critical Care Medicine Staff Physician [place] [facility] Pager: [phone], If no answer or between  15:00h - 7:00h: call [phone]  03/21/2016 2:41 AM  '

## 2016-03-23 NOTE — Telephone Encounter (Signed)
Attempted to contact pt. No answer, no option to leave a message. Will try back.  

## 2016-03-27 NOTE — Telephone Encounter (Signed)
lmtcb for pt.  

## 2016-03-28 NOTE — Telephone Encounter (Signed)
lmtcb for pt.  

## 2016-04-03 NOTE — Telephone Encounter (Signed)
lmtcb for pt. [name] for pt's EC, [name].

## 2016-04-09 NOTE — Telephone Encounter (Signed)
lmtcb for pt.  Letter sent.

## 2016-04-16 ENCOUNTER — Other Ambulatory Visit: Payer: Self-pay | Admitting: Family Medicine

## 2016-05-19 ENCOUNTER — Other Ambulatory Visit: Payer: Self-pay | Admitting: Family Medicine

## 2016-05-24 ENCOUNTER — Other Ambulatory Visit: Payer: Self-pay | Admitting: Internal Medicine

## 2016-06-02 ENCOUNTER — Other Ambulatory Visit: Payer: Self-pay | Admitting: Family Medicine

## 2016-06-02 ENCOUNTER — Other Ambulatory Visit: Payer: Self-pay | Admitting: Physician Assistant

## 2016-06-03 NOTE — Telephone Encounter (Signed)
Last ov 10/2015

## 2016-06-20 ENCOUNTER — Telehealth: Payer: Self-pay | Admitting: Internal Medicine

## 2016-06-20 DIAGNOSIS — R0602 Shortness of breath: Secondary | ICD-10-CM

## 2016-06-20 MED ORDER — PREDNISONE 10 MG PO TABS: ORAL_TABLET | ORAL | 0 refills | Status: AC

## 2016-06-20 MED ORDER — AZITHROMYCIN 250 MG PO TABS: ORAL_TABLET | ORAL | 0 refills | Status: AC

## 2016-06-20 NOTE — Telephone Encounter (Signed)
Called and spoke to pt. Informed him of the recs per MR. Orders placed. .Pt verbalized understanding and denied any further questions or concerns at this time.    PCC's please advise when CT can be scheduled next week, will need to get pt an appt with an APP.

## 2016-06-20 NOTE — Telephone Encounter (Signed)
Needs to come in to seee APP after doing  1) CT chest wo contrast - nex week 2) cbc, bmet, mag, phos, lft - next week 3) for 06/20/2016   - Zpak  - Please take prednisone 40 mg x1 day, then 30 mg x1 day, then 20 mg x1 day, then 10 mg x1 day, and then 5 mg x1 day and stop   Dr. Brand Males, M.D., [facility].C.P Pulmonary and Critical Care Medicine Staff Physician [place] [facility] Pager: [phone], If no answer or between  15:00h - 7:00h: call [phone]  06/20/2016 5:03 PM

## 2016-06-20 NOTE — Telephone Encounter (Signed)
Patient states that his O2 levels has been dropping into the 80s for the last 2-3 days.  Pt notes some cough and increased SOB.  Pt wears O2 at 4 liters continuous 24/7.  Pt aware that he needs to have a follow up visit because he never followed up after his CT scan in November 2017. Pt still needs these results. Pt states that getting transportation to the office is very difficult.   Riley Males, MD  2:40 AM  Note    [name]  Repeat ct chest `02/20/16 shows RUL mass enlarging. Can you guive him first avail to discuss please? Might have to consider ENB with RB      Please advise Dr Chase Caller of any rec's. Thanks.

## 2016-06-21 ENCOUNTER — Telehealth: Payer: Self-pay | Admitting: Internal Medicine

## 2016-06-21 DIAGNOSIS — R918 Other nonspecific abnormal finding of lung field: Secondary | ICD-10-CM

## 2016-06-21 NOTE — Telephone Encounter (Signed)
Pt scheduled for chest ct 06/22/16 [name]

## 2016-06-21 NOTE — Telephone Encounter (Signed)
MR  Please Advise-  According to your phone note yesterday you wanted pt to see a APP next week after this CT scan. Pt CT scan is tomorrow 06/22/16. No app is available next week but that following week we can get the pt in. Please let us know what you would like to do

## 2016-06-22 ENCOUNTER — Ambulatory Visit (INDEPENDENT_AMBULATORY_CARE_PROVIDER_SITE_OTHER)
Admission: RE | Admit: 2016-06-22 | Discharge: 2016-06-22 | Disposition: A | Discharge status: Home / Self Care | Payer: Medicare Other | Source: Ambulatory Visit | Attending: Internal Medicine | Admitting: Internal Medicine

## 2016-06-22 DIAGNOSIS — R0602 Shortness of breath: Secondary | ICD-10-CM

## 2016-06-26 NOTE — Telephone Encounter (Signed)
OK I reviewed his CT results  1. The RUL Mass has grown even further ; it was biopsied by radiolggy in sept 2017 (had pneumothorax then) and did not show cancer cells at that time but itis growing still   Plan  - do PET scan  - get radiology to do Super D of most recent scan - and with above 2 how often can he see [place] Apo instead of APP?  Thanks  Dr. Brand Males, M.D., [facility].C.P Pulmonary and Critical Care Medicine Staff Physician [place] [facility] Pager: [phone], If no answer or between  15:00h - 7:00h: call [phone]  06/26/2016 6:53 AM

## 2016-06-26 NOTE — Telephone Encounter (Signed)
lmtcb x1 for pt. 

## 2016-07-16 NOTE — Telephone Encounter (Signed)
lmtcb for pt.  

## 2016-07-17 NOTE — Telephone Encounter (Signed)
MR  Spoke with Stacy from [place] who stated that they will not be able to do the super D on pt's scan due to how long it has been

## 2016-07-17 NOTE — Telephone Encounter (Signed)
Called and spoke to pt. Informed him of the results and recs per MR. Order placed. Called CT to see if they can change pt's scan to super D, and was advised they are at lunch till 1pm. WCB.

## 2016-07-19 ENCOUNTER — Telehealth: Payer: Self-pay | Admitting: Internal Medicine

## 2016-07-19 ENCOUNTER — Other Ambulatory Visit: Payer: Self-pay | Admitting: Family Medicine

## 2016-07-19 NOTE — Telephone Encounter (Signed)
[name]

## 2016-07-20 NOTE — Telephone Encounter (Signed)
Pt needs an appt for any additional refills - see if pt would schedule for DM recheck within the next 3 months

## 2016-07-23 NOTE — Telephone Encounter (Signed)
lmomtcb x 2  

## 2016-07-24 ENCOUNTER — Ambulatory Visit (HOSPITAL_COMMUNITY): Payer: Medicare Other

## 2016-07-24 NOTE — Telephone Encounter (Signed)
Will forward to MR to make him aware that pt did not want the PET scan.

## 2016-07-25 NOTE — Telephone Encounter (Signed)
Called and lmomtcb x 2 for the pt to call back.  He will need follow up with either MR or RB.

## 2016-07-25 NOTE — Telephone Encounter (Signed)
Duplicate message.  Please see other phone note on this pt.

## 2016-07-25 NOTE — Telephone Encounter (Signed)
[name]

## 2016-07-25 NOTE — Telephone Encounter (Signed)
Ok just give him fu with me first avail or [place]. Let me know if [place] because of the mass  Dr. Brand Males, M.D., [facility].C.P Pulmonary and Critical Care Medicine Staff Physician [place] [facility] Pager: [phone], If no answer or between  15:00h - 7:00h: call [phone]  07/25/2016 9:04 AM

## 2016-07-30 NOTE — Telephone Encounter (Signed)
lmtcb x1 for pt to scheduled first available with MR or RB.

## 2016-07-30 NOTE — Telephone Encounter (Signed)
He will need first avail to see me (MR) or RB  Thanks  .Dr. Brand Males, M.D., [facility].C.P Pulmonary and Critical Care Medicine Staff Physician [place] [facility] Pager: [phone], If no answer or between  15:00h - 7:00h: call [phone]  07/30/2016 7:02 AM

## 2016-07-30 NOTE — Telephone Encounter (Signed)
lmtcb for pt to scheduled first available with MR or RB.

## 2016-08-01 NOTE — Telephone Encounter (Signed)
lmom tcb x1 please see previous message to get pt a follow up appt.

## 2016-08-02 NOTE — Telephone Encounter (Signed)
LM x 5, per triage protocol will close message d/t multiple unsuccessful attempts Letter mailed to patient Message sent back to Mr as Juluis Rainier that the patient is not returning our call.

## 2016-08-16 ENCOUNTER — Other Ambulatory Visit: Payer: Self-pay | Admitting: Family Medicine

## 2016-08-19 NOTE — Telephone Encounter (Signed)
Pt is long-overdue for a f/u OV with FASTING labs. Please have her sched an appt within the next 3 months so we can continue to make sure the medicines are keeping her healthy and safe before we refill them further. Thanks.

## 2016-08-19 NOTE — Telephone Encounter (Signed)
Pt is long-overdue for a f/u OV with FASTING labs. Please have her sched an appt within the next 3 month so we can continue to make sure the medicines are keeping her healthy and safe before we refill them further. Thanks.

## 2016-09-01 ENCOUNTER — Other Ambulatory Visit: Payer: Self-pay | Admitting: Internal Medicine

## 2016-10-20 ENCOUNTER — Other Ambulatory Visit: Payer: Self-pay | Admitting: Family Medicine

## 2016-10-20 NOTE — Telephone Encounter (Signed)
Needs appt with fasting labs within 1 mo for any further refills.

## 2016-10-28 ENCOUNTER — Other Ambulatory Visit: Payer: Self-pay | Admitting: Family Medicine

## 2016-11-13 ENCOUNTER — Encounter (HOSPITAL_COMMUNITY): Payer: Self-pay | Admitting: Emergency Medicine

## 2016-11-13 ENCOUNTER — Emergency Department (HOSPITAL_COMMUNITY): Payer: Medicare Other

## 2016-11-13 ENCOUNTER — Inpatient Hospital Stay (HOSPITAL_COMMUNITY)
Admission: EM | Admit: 2016-11-13 | Discharge: 2016-11-24 | DRG: 871 | Disposition: E | Payer: Medicare Other | Attending: Internal Medicine | Admitting: Internal Medicine

## 2016-11-13 DIAGNOSIS — C3491 Malignant neoplasm of unspecified part of right bronchus or lung: Secondary | ICD-10-CM

## 2016-11-13 DIAGNOSIS — J9601 Acute respiratory failure with hypoxia: Secondary | ICD-10-CM | POA: Diagnosis present

## 2016-11-13 DIAGNOSIS — Z66 Do not resuscitate: Secondary | ICD-10-CM | POA: Diagnosis not present

## 2016-11-13 DIAGNOSIS — C342 Malignant neoplasm of middle lobe, bronchus or lung: Secondary | ICD-10-CM | POA: Diagnosis present

## 2016-11-13 DIAGNOSIS — J8410 Pulmonary fibrosis, unspecified: Secondary | ICD-10-CM | POA: Diagnosis present

## 2016-11-13 DIAGNOSIS — E876 Hypokalemia: Secondary | ICD-10-CM | POA: Diagnosis present

## 2016-11-13 DIAGNOSIS — E785 Hyperlipidemia, unspecified: Secondary | ICD-10-CM | POA: Diagnosis present

## 2016-11-13 DIAGNOSIS — Z515 Encounter for palliative care: Secondary | ICD-10-CM | POA: Diagnosis not present

## 2016-11-13 DIAGNOSIS — A419 Sepsis, unspecified organism: Secondary | ICD-10-CM | POA: Diagnosis present

## 2016-11-13 DIAGNOSIS — E870 Hyperosmolality and hypernatremia: Secondary | ICD-10-CM | POA: Diagnosis present

## 2016-11-13 DIAGNOSIS — Z7984 Long term (current) use of oral hypoglycemic drugs: Secondary | ICD-10-CM

## 2016-11-13 DIAGNOSIS — C799 Secondary malignant neoplasm of unspecified site: Secondary | ICD-10-CM

## 2016-11-13 DIAGNOSIS — E119 Type 2 diabetes mellitus without complications: Secondary | ICD-10-CM | POA: Diagnosis present

## 2016-11-13 DIAGNOSIS — IMO0002 Reserved for concepts with insufficient information to code with codable children: Secondary | ICD-10-CM | POA: Diagnosis present

## 2016-11-13 DIAGNOSIS — M8458XA Pathological fracture in neoplastic disease, other specified site, initial encounter for fracture: Secondary | ICD-10-CM | POA: Diagnosis present

## 2016-11-13 DIAGNOSIS — R4182 Altered mental status, unspecified: Secondary | ICD-10-CM

## 2016-11-13 DIAGNOSIS — E860 Dehydration: Secondary | ICD-10-CM | POA: Diagnosis not present

## 2016-11-13 DIAGNOSIS — E118 Type 2 diabetes mellitus with unspecified complications: Secondary | ICD-10-CM | POA: Diagnosis not present

## 2016-11-13 DIAGNOSIS — N390 Urinary tract infection, site not specified: Secondary | ICD-10-CM | POA: Diagnosis present

## 2016-11-13 DIAGNOSIS — I214 Non-ST elevation (NSTEMI) myocardial infarction: Secondary | ICD-10-CM | POA: Diagnosis present

## 2016-11-13 DIAGNOSIS — J984 Other disorders of lung: Secondary | ICD-10-CM | POA: Diagnosis not present

## 2016-11-13 DIAGNOSIS — M069 Rheumatoid arthritis, unspecified: Secondary | ICD-10-CM | POA: Diagnosis present

## 2016-11-13 DIAGNOSIS — G9340 Encephalopathy, unspecified: Secondary | ICD-10-CM | POA: Diagnosis present

## 2016-11-13 DIAGNOSIS — Z9981 Dependence on supplemental oxygen: Secondary | ICD-10-CM | POA: Diagnosis not present

## 2016-11-13 DIAGNOSIS — Z7982 Long term (current) use of aspirin: Secondary | ICD-10-CM

## 2016-11-13 DIAGNOSIS — C7951 Secondary malignant neoplasm of bone: Secondary | ICD-10-CM | POA: Diagnosis present

## 2016-11-13 DIAGNOSIS — D630 Anemia in neoplastic disease: Secondary | ICD-10-CM | POA: Diagnosis present

## 2016-11-13 DIAGNOSIS — Z7189 Other specified counseling: Secondary | ICD-10-CM

## 2016-11-13 DIAGNOSIS — Z8261 Family history of arthritis: Secondary | ICD-10-CM | POA: Diagnosis not present

## 2016-11-13 DIAGNOSIS — Z87891 Personal history of nicotine dependence: Secondary | ICD-10-CM

## 2016-11-13 DIAGNOSIS — J9621 Acute and chronic respiratory failure with hypoxia: Secondary | ICD-10-CM | POA: Diagnosis present

## 2016-11-13 DIAGNOSIS — I248 Other forms of acute ischemic heart disease: Secondary | ICD-10-CM | POA: Diagnosis not present

## 2016-11-13 DIAGNOSIS — R918 Other nonspecific abnormal finding of lung field: Secondary | ICD-10-CM | POA: Diagnosis present

## 2016-11-13 DIAGNOSIS — I10 Essential (primary) hypertension: Secondary | ICD-10-CM | POA: Diagnosis present

## 2016-11-13 DIAGNOSIS — E1165 Type 2 diabetes mellitus with hyperglycemia: Secondary | ICD-10-CM | POA: Diagnosis present

## 2016-11-13 DIAGNOSIS — J449 Chronic obstructive pulmonary disease, unspecified: Secondary | ICD-10-CM | POA: Diagnosis present

## 2016-11-13 DIAGNOSIS — C3490 Malignant neoplasm of unspecified part of unspecified bronchus or lung: Secondary | ICD-10-CM | POA: Diagnosis present

## 2016-11-13 DIAGNOSIS — D649 Anemia, unspecified: Secondary | ICD-10-CM | POA: Diagnosis present

## 2016-11-13 HISTORY — DX: Chronic obstructive pulmonary disease, unspecified: J44.9

## 2016-11-13 LAB — CBC WITH DIFFERENTIAL/PLATELET
Basophils Absolute: 0 10*3/uL (ref 0.0–0.1)
Basophils Relative: 0 %
EOS PCT: 0 %
Eosinophils Absolute: 0 10*3/uL (ref 0.0–0.7)
HEMATOCRIT: 39.6 % (ref 39.0–52.0)
Hemoglobin: 12.5 g/dL — ABNORMAL LOW (ref 13.0–17.0)
LYMPHS ABS: 1.3 10*3/uL (ref 0.7–4.0)
Lymphocytes Relative: 5 %
MCH: 28.5 pg (ref 26.0–34.0)
MCHC: 31.6 g/dL (ref 30.0–36.0)
MCV: 90.4 fL (ref 78.0–100.0)
MONO ABS: 1.3 10*3/uL — AB (ref 0.1–1.0)
MONOS PCT: 5 %
NEUTROS ABS: 22.8 10*3/uL — AB (ref 1.7–7.7)
Neutrophils Relative %: 90 %
Platelets: 527 10*3/uL — ABNORMAL HIGH (ref 150–400)
RBC: 4.38 MIL/uL (ref 4.22–5.81)
RDW: 16.8 % — AB (ref 11.5–15.5)
WBC: 25.4 10*3/uL — ABNORMAL HIGH (ref 4.0–10.5)

## 2016-11-13 LAB — I-STAT CHEM 8, ED
BUN: 42 mg/dL — AB (ref 6–20)
CALCIUM ION: 1.46 mmol/L — AB (ref 1.15–1.40)
CHLORIDE: 101 mmol/L (ref 101–111)
Creatinine, Ser: 1.1 mg/dL (ref 0.61–1.24)
Glucose, Bld: 165 mg/dL — ABNORMAL HIGH (ref 65–99)
HEMATOCRIT: 39 % (ref 39.0–52.0)
Hemoglobin: 13.3 g/dL (ref 13.0–17.0)
Potassium: 2.7 mmol/L — CL (ref 3.5–5.1)
Sodium: 144 mmol/L (ref 135–145)
TCO2: 29 mmol/L (ref 0–100)

## 2016-11-13 LAB — I-STAT ARTERIAL BLOOD GAS, ED
ACID-BASE EXCESS: 7 mmol/L — AB (ref 0.0–2.0)
BICARBONATE: 31.4 mmol/L — AB (ref 20.0–28.0)
O2 Saturation: 96 %
PO2 ART: 79 mmHg — AB (ref 83.0–108.0)
Patient temperature: 98.6
TCO2: 33 mmol/L (ref 0–100)
pCO2 arterial: 42.7 mmHg (ref 32.0–48.0)
pH, Arterial: 7.474 — ABNORMAL HIGH (ref 7.350–7.450)

## 2016-11-13 LAB — CK: CK TOTAL: 179 U/L (ref 49–397)

## 2016-11-13 LAB — I-STAT CG4 LACTIC ACID, ED: Lactic Acid, Venous: 5.74 mmol/L (ref 0.5–1.9)

## 2016-11-13 LAB — COMPREHENSIVE METABOLIC PANEL
ALBUMIN: 3 g/dL — AB (ref 3.5–5.0)
ALK PHOS: 148 U/L — AB (ref 38–126)
ALT: 35 U/L (ref 17–63)
ANION GAP: 17 — AB (ref 5–15)
AST: 58 U/L — ABNORMAL HIGH (ref 15–41)
BILIRUBIN TOTAL: 0.9 mg/dL (ref 0.3–1.2)
BUN: 40 mg/dL — AB (ref 6–20)
CALCIUM: 12.4 mg/dL — AB (ref 8.9–10.3)
CO2: 26 mmol/L (ref 22–32)
Chloride: 101 mmol/L (ref 101–111)
Creatinine, Ser: 1.26 mg/dL — ABNORMAL HIGH (ref 0.61–1.24)
GFR calc Af Amer: >60 mL/min (ref >60)
GFR, EST NON AFRICAN AMERICAN: 57 mL/min — AB (ref >60)
GLUCOSE: 164 mg/dL — AB (ref 65–99)
Potassium: 2.8 mmol/L — ABNORMAL LOW (ref 3.5–5.1)
Sodium: 144 mmol/L (ref 135–145)
TOTAL PROTEIN: 7.5 g/dL (ref 6.5–8.1)

## 2016-11-13 LAB — URINALYSIS, ROUTINE W REFLEX MICROSCOPIC
BILIRUBIN URINE: NEGATIVE
GLUCOSE, UA: NEGATIVE mg/dL
Ketones, ur: 20 mg/dL — AB
NITRITE: NEGATIVE
PH: 5 (ref 5.0–8.0)
Protein, ur: 30 mg/dL — AB
SPECIFIC GRAVITY, URINE: 1.02 (ref 1.005–1.030)
Squamous Epithelial / LPF: NONE SEEN

## 2016-11-13 LAB — ETHANOL

## 2016-11-13 LAB — I-STAT TROPONIN, ED: Troponin i, poc: 3.44 ng/mL (ref 0.00–0.08)

## 2016-11-13 LAB — SALICYLATE LEVEL

## 2016-11-13 LAB — CBG MONITORING, ED: Glucose-Capillary: 164 mg/dL — ABNORMAL HIGH (ref 65–99)

## 2016-11-13 LAB — BRAIN NATRIURETIC PEPTIDE: B Natriuretic Peptide: 1041 pg/mL — ABNORMAL HIGH (ref 0.0–100.0)

## 2016-11-13 LAB — ACETAMINOPHEN LEVEL: Acetaminophen (Tylenol), Serum: <10 ug/mL — ABNORMAL LOW (ref 10–30)

## 2016-11-13 MED ORDER — SODIUM CHLORIDE 0.9 % IV BOLUS (SEPSIS)
1000.0000 mL | Freq: Once | INTRAVENOUS | Status: AC
  Administered 2016-11-13: 1000 mL via INTRAVENOUS

## 2016-11-13 MED ORDER — VANCOMYCIN HCL IN DEXTROSE 1-5 GM/200ML-% IV SOLN
1000.0000 mg | Freq: Two times a day (BID) | INTRAVENOUS | Status: DC
  Administered 2016-11-14: 1000 mg via INTRAVENOUS
  Filled 2016-11-13 (×3): qty 200

## 2016-11-13 MED ORDER — IOPAMIDOL (ISOVUE-370) INJECTION 76%
INTRAVENOUS | Status: AC
  Filled 2016-11-13: qty 100

## 2016-11-13 MED ORDER — HEPARIN BOLUS VIA INFUSION
4000.0000 [IU] | Freq: Once | INTRAVENOUS | Status: AC
  Administered 2016-11-14: 4000 [IU] via INTRAVENOUS
  Filled 2016-11-13: qty 4000

## 2016-11-13 MED ORDER — IOPAMIDOL (ISOVUE-370) INJECTION 76%: 100.0000 mL | Freq: Once | INTRAVENOUS | Status: DC | PRN

## 2016-11-13 MED ORDER — PIPERACILLIN-TAZOBACTAM 3.375 G IVPB
3.3750 g | Freq: Three times a day (TID) | INTRAVENOUS | Status: DC
  Administered 2016-11-14: 3.375 g via INTRAVENOUS
  Filled 2016-11-13 (×3): qty 50

## 2016-11-13 MED ORDER — SODIUM CHLORIDE 0.9 % IV SOLN
1500.0000 mg | Freq: Once | INTRAVENOUS | Status: AC
  Administered 2016-11-13: 1500 mg via INTRAVENOUS
  Filled 2016-11-13: qty 1500

## 2016-11-13 MED ORDER — POTASSIUM CHLORIDE 10 MEQ/100ML IV SOLN
10.0000 meq | INTRAVENOUS | Status: AC
  Administered 2016-11-13 – 2016-11-14 (×2): 10 meq via INTRAVENOUS
  Filled 2016-11-13: qty 100

## 2016-11-13 MED ORDER — VANCOMYCIN HCL IN DEXTROSE 1-5 GM/200ML-% IV SOLN: 1000.0000 mg | Freq: Once | INTRAVENOUS | Status: DC

## 2016-11-13 MED ORDER — ALBUTEROL (5 MG/ML) CONTINUOUS INHALATION SOLN
20.0000 mg/h | INHALATION_SOLUTION | RESPIRATORY_TRACT | Status: AC
  Administered 2016-11-13: 20 mg/h via RESPIRATORY_TRACT
  Filled 2016-11-13: qty 20

## 2016-11-13 MED ORDER — PIPERACILLIN-TAZOBACTAM 3.375 G IVPB 30 MIN
3.3750 g | Freq: Once | INTRAVENOUS | Status: AC
  Administered 2016-11-13: 3.375 g via INTRAVENOUS
  Filled 2016-11-13: qty 50

## 2016-11-13 MED ORDER — HEPARIN (PORCINE) IN NACL 100-0.45 UNIT/ML-% IJ SOLN
1250.0000 [IU]/h | INTRAMUSCULAR | Status: DC
  Administered 2016-11-14: 1000 [IU]/h via INTRAVENOUS
  Filled 2016-11-13: qty 250

## 2016-11-13 MED ORDER — MAGNESIUM SULFATE 2 GM/50ML IV SOLN
2.0000 g | Freq: Once | INTRAVENOUS | Status: AC
  Administered 2016-11-13: 2 g via INTRAVENOUS
  Filled 2016-11-13: qty 50

## 2016-11-13 NOTE — ED Notes (Signed)
Patient currently at CT scan .  

## 2016-11-13 NOTE — Progress Notes (Signed)
Pharmacy Antibiotic Note  [name] is a 66 y.o. male admitted on 11/23/2016 with sepsis.  Pharmacy has been consulted for Vancomycin and zosyn dosing. WBC elevated at 25.4. SCr 1.1 which is baseline. CrCl ~65-70 mL/min   Plan: -Vancomycin 1500 mg IV once followed by Vancomycin 1 gm IV Q 12 hours -Zosyn 3.375 gm IV Q 8 hours -Monitor CBC, renal fx, cultures and clinical progress -VT at SS   Height: 5\' 10"  (177.8 cm) Weight: 185 lb (83.9 kg) IBW/kg (Calculated) : 73  No data recorded.   Recent Labs Lab 10/24/2016 2035 11/22/2016 2045 11/20/2016 2046  WBC 25.4*  --   --   CREATININE  --  1.10  --   LATICACIDVEN  --   --  5.74*    Estimated Creatinine Clearance: 67.3 mL/min (by C-G formula based on SCr of 1.1 mg/dL).    No Known Allergies  Antimicrobials this admission: Vanc 8/21 >>  Zosyn 8/21 >>   Dose adjustments this admission: None  Microbiology results: 8/21 UCx:    Thank you for allowing pharmacy to be a part of this patient's care.  Albertina Parr, PharmD., BCPS Clinical Pharmacist Pager [phone]

## 2016-11-13 NOTE — ED Triage Notes (Signed)
Patient arrived with EMS found on the floor by family's friend with urinary incontinence / SOB and generalized weakness . Received Albuterol 5 mg nebulizer by EMS prior to arrival .

## 2016-11-13 NOTE — H&P (Signed)
History and Physical    [name]:[id number] DOB: 1950/06/23 DOA: 10/26/2016  PCP: Shawnee Knapp, MD   Patient coming from: Home.   I have personally briefly reviewed patient's old medical records in [place]  Chief Complaint: Found on the floor by friends.  HPI: [name] is a 66 y.o. male with medical history significant of COPD, type 2 diabetes, dyspnea, GERD, hypertension, pulmonary fibrosis, rheumatoid arthritis, metastatic right lung carcinoma who was brought via EMS to the emergency department after her friend found him on the floor, altered with dyspnea and urinary incontinence. He was hypoxic when EMS arrived. They provided him with supplemental oxygen and didn't add nebulizer treatment in route to the hospital. No further information is now available, said that his friend, [name], whois his POA and has told him that he does now want treatment for his lung cancer.  ED Course: Initial vital signs temperature 98.77F, pulse 109, blood pressure 124/77 mmHg, respirations 35 and O2 sat 98% on room air. He received vancomycin, Zosyn, normal saline 2 L bolus, magnesium sulfate and was started on BiPAP ventilation. ABGs show a pH of 7.47, PCO2 of 43, PO2 of 79, bicarbonate 31 and O2 sat 96%. Urinalysis showed significant leukocyturia, hemoglobinuria and bacteriuria. WBC was 25.4, hemoglobin 12.5 g/dL and platelets 527. Sodium 144, potassium 2.8, chloride 101, bicarbonate 26 and initial lactic acid 5.74 mmol/L. BUN was 40, creatinine 1.26, calcium 12.4 and glucose 164 mg/dL. AST was 58, alkaline phosphatase 148 and albumin 3.0, the rest of the LFTs were unremarkable. EtOH, acetaminophen and salicylate were within normal limits.  Imaging: CT chest angiogram shows cavitating mass in the right middle lobe, which considerably larger than on previous imaging. CT head shows right parietal lytic lesions consistent with metastases. CT renal shows multiple metastasis in the pelvis  and spine.   Review of Systems: As per HPI otherwise 10 point review of systems negative.    Past Medical History:  Diagnosis Date  . COPD (chronic obstructive pulmonary disease) ([place])   . Diabetes mellitus without complication ([place])   . Dyspnea   . GERD (gastroesophageal reflux disease)   . Hypertension   . Pulmonary fibrosis ([place])   . Rheumatoid arthritis(714.0) 2005    Past Surgical History:  Procedure Laterality Date  . Excision mass R lateral arm 2010  2010  . EYE SURGERY    . MASS EXCISION  03/08/2011   Procedure: EXCISION MASS;  Surgeon: Cammie Sickle., MD;  Location: [facility];  Service: Orthopedics;  Laterality: Right;  excision rheumatoid nodule right ulna  . NECK SURGERY       reports that he quit smoking about 8 years ago. He started smoking about 47 years ago. He has a 19.50 pack-year smoking history. He has never used smokeless tobacco. He reports that he does not drink alcohol or use drugs.  No Known Allergies  Family History  Problem Relation Age of Onset  . Cancer Father   . Rheum arthritis Sister   . Lung disease Neg Hx      Prior to Admission medications   Medication Sig Start Date End Date Taking? Authorizing Provider  albuterol (VENTOLIN HFA) 108 (90 BASE) MCG/ACT inhaler Inhale 2 puffs into the lungs every 4 (four) hours as needed for wheezing or shortness of breath. 11/20/13   Shawnee Knapp, MD  aspirin 81 MG tablet Take 81 mg by mouth daily.    [provider]  azithromycin (  ZITHROMAX) 250 MG tablet Take as directed 06/20/16   Brand Males, MD  Blood Glucose Monitoring Suppl (BAYER CONTOUR MONITOR) w/Device KIT     Sig: TEST BLOOD SUGAR DAILY. DX CODE: E11.65  12/15/15   Pamelia Hoit, MD  buPROPion (WELLBUTRIN XL) 150 MG 24 hr tablet Take 2 tablets (300 mg total) by mouth daily. 12/08/15   Whiteheart, Cristal Ford, NP  gabapentin (NEURONTIN) 400 MG capsule TAKE 2 CAPSULES (800 MG TOTAL) BY MOUTH 3 (THREE) TIMES DAILY.  06/04/16   Shawnee Knapp, MD  glucose blood (BAYER CONTOUR NEXT TEST) test strip TEST BLOOD SUGAR DAILY. DX CODE: E11.65 12/15/15   Pamelia Hoit, MD  ipratropium-albuterol (DUONEB) 0.5-2.5 (3) MG/3ML SOLN TAKE 3 MLS BY NEBULIZATION EVERY 4 (FOUR) HOURS AS NEEDED. Patient taking differently: TAKE 3 MLS BY NEBULIZATION EVERY 4 (FOUR) HOURS AS NEEDED FOR WHEEZING 09/27/15   Shawnee Knapp, MD  losartan (COZAAR) 50 MG tablet TAKE 1 TABLET BY MOUTH EVERY DAY FOR BLOOD PRESSURE 08/19/16   Shawnee Knapp, MD  meloxicam (MOBIC) 7.5 MG tablet Take 1 tablet (7.5 mg total) by mouth daily as needed for pain. Do not use with any other otc pain medication other than acetaminophen 03/18/15   Shawnee Knapp, MD  metFORMIN (GLUCOPHAGE) 500 MG tablet Take 1 tablet (500 mg total) by mouth 2 (two) times daily with a meal. **Need office visit for any further refills** 10/20/16   Shawnee Knapp, MD  mycophenolate (CELLCEPT) 500 MG tablet TAKE 2 TABLETS TWICE DAILY 1 HOUR BEFORE OR 2 HOURS  AFTER  MEAL 08/23/15   Brand Males, MD  pravastatin (PRAVACHOL) 40 MG tablet TAKE 1 TABLET BY MOUTH AT BEDTIME 05/21/16   Harrison Mons, PA-C  pravastatin (PRAVACHOL) 40 MG tablet TAKE 1 TABLET BY MOUTH AT BEDTIME 08/19/16   Shawnee Knapp, MD  predniSONE (DELTASONE) 10 MG tablet 40 mg x1 day, then 30 mg x1 day, then 20 mg x1 day, then 10 mg x1 day, and then 5 mg x1 day and stop 06/20/16   Brand Males, MD  sulfamethoxazole-trimethoprim (BACTRIM DS,SEPTRA DS) 800-160 MG tablet TAKE 1 TABLET BY MOUTH 3 (THREE) TIMES A WEEK. 09/03/16   Brand Males, MD    Physical Exam: Vitals:   11/05/2016 2145 10/30/2016 2200 11/19/2016 2230 10/25/2016 2245  BP: 126/83 (!) 120/94 125/82 124/82  Pulse: 89 (!) 105 87 94  Resp: (!) 26 (!) 24 (!) 25 (!) 30  Temp:      TempSrc:      SpO2: 99% 99% 100% 100%  Weight:      Height:        Constitutional: Obtunded. On BiPAP ventilation. PERRL, lids and conjunctivae normal ENMT:  BiPAP mask on. Mucous membranes are dry .  Posterior pharynx clear of any exudate or lesions.  Neck: normal, supple, no masses, no thyromegaly Respiratory: Decreased breath sounds bilaterally, right > left., mild rhonchi bilaterally. On BiPAP.No accessory muscle use.  Cardiovascular: Regular rate and rhythm, no murmurs / rubs / gallops. No extremity edema. 2+ pedal pulses. No carotid bruits.  Abdomen: Soft, no tenderness, no masses palpated. No hepatosplenomegaly. Bowel sounds positive.  Musculoskeletal: no clubbing / cyanosis. Positive protective pads on hands. Good ROM, no contractures. Decreased muscle tone.  Skin: no rashes, lesions, ulcers. Neurologic: Obtunded. Seems grossly nonfocal. Unable to fully evaluate. Psychiatric: Obtunded. Unable to converse.   Labs on Admission: I have personally reviewed following labs and imaging studies  CBC:  Recent Labs Lab 11/20/2016  2035 11/06/2016 2045  WBC 25.4*  --   NEUTROABS 22.8*  --   HGB 12.5* 13.3  HCT 39.6 39.0  MCV 90.4  --   PLT 527*  --    Basic Metabolic Panel:  Recent Labs Lab 10/29/2016 2035 11/12/2016 2045  NA 144 144  K 2.8* 2.7*  CL 101 101  CO2 26  --   GLUCOSE 164* 165*  BUN 40* 42*  CREATININE 1.26* 1.10  CALCIUM 12.4*  --    GFR: Estimated Creatinine Clearance: 67.3 mL/min (by C-G formula based on SCr of 1.1 mg/dL). Liver Function Tests:  Recent Labs Lab 10/27/2016 2035  AST 58*  ALT 35  ALKPHOS 148*  BILITOT 0.9  PROT 7.5  ALBUMIN 3.0*   No results for input(s): LIPASE, AMYLASE in the last 168 hours. No results for input(s): AMMONIA in the last 168 hours. Coagulation Profile: No results for input(s): INR, PROTIME in the last 168 hours. Cardiac Enzymes:  Recent Labs Lab 11/23/2016 2035  CKTOTAL 179   BNP (last 3 results) No results for input(s): PROBNP in the last 8760 hours. HbA1C: No results for input(s): HGBA1C in the last 72 hours. CBG:  Recent Labs Lab 11/22/2016 2034  GLUCAP 164*   Lipid Profile: No results for input(s): CHOL,  HDL, LDLCALC, TRIG, CHOLHDL, LDLDIRECT in the last 72 hours. Thyroid Function Tests: No results for input(s): TSH, T4TOTAL, FREET4, T3FREE, THYROIDAB in the last 72 hours. Anemia Panel: No results for input(s): VITAMINB12, FOLATE, FERRITIN, TIBC, IRON, RETICCTPCT in the last 72 hours. Urine analysis:    Component Value Date/Time   COLORURINE YELLOW 11/01/2016 2035   APPEARANCEUR CLOUDY (A) 10/27/2016 2035   LABSPEC 1.020 11/01/2016 2035   PHURINE 5.0 11/18/2016 2035   GLUCOSEU NEGATIVE 11/01/2016 2035   HGBUR LARGE (A) 11/04/2016 2035   BILIRUBINUR NEGATIVE 10/29/2016 2035   BILIRUBINUR neg 01/26/2013 1752   KETONESUR 20 (A) 11/19/2016 2035   PROTEINUR 30 (A) 11/19/2016 2035   UROBILINOGEN 0.2 01/26/2013 1752   NITRITE NEGATIVE 10/24/2016 2035   LEUKOCYTESUR LARGE (A) 11/02/2016 2035    Radiological Exams on Admission: Ct Head Wo Contrast  Result Date: 10/28/2016 CLINICAL DATA:  Found on floor by a friend. Altered level of consciousness. Urinary incontinence and weakness. EXAM: CT HEAD WITHOUT CONTRAST TECHNIQUE: Contiguous axial images were obtained from the base of the skull through the vertex without intravenous contrast. COMPARISON:  Chest CT exams 12/05/2015 and 06/22/2016. FINDINGS: Brain: No evidence of acute infarction, hemorrhage, hydrocephalus, extra-axial collection or mass lesion/mass effect. Minimal small vessel ischemic disease of periventricular white matter. Small chronic appearing right cerebellar infarct, series 3, image 8. Vascular: No hyperdense vessels. Moderate atherosclerosis of the cavernous carotids bilaterally. Skull: Right parietal slightly expansile lytic lesion measuring approximately 13 x 11 x 11 mm near the vertex of the skull is concerning for lytic metastasis in light of pulmonary findings on prior chest CT exams from 06/22/2016 and 12/05/2015. No acute fracture. Sinuses/Orbits: Evidence of remote right orbital floor repair. No acute sinus disease. Intact  globes. Other: None IMPRESSION: 1. There is a 13 x 11 x 11 mm slightly expansile lytic lesion involving the right parietal skull near the vertex. This in conjunction with pulmonary findings on prior CT exams raise concern for lytic metastasis. Less likely posttraumatic or infectious etiologies. 2. Minimal small vessel ischemic disease of periventricular white matter. Chronic tiny right cerebellar infarct. 3. No acute intracranial abnormality or focal mass on this unenhanced study. Electronically Signed  By: Ashley Royalty M.D.   On: 10/27/2016 21:40   Ct Angio Chest Pe W/cm &/or Wo Cm  Result Date: 11/05/2016 CLINICAL DATA:  Shortness of breath. EXAM: CT ANGIOGRAPHY CHEST WITH CONTRAST TECHNIQUE: Multidetector CT imaging of the chest was performed using the standard protocol during bolus administration of intravenous contrast. Multiplanar CT image reconstructions and MIPs were obtained to evaluate the vascular anatomy. CONTRAST:  84 mL of Isovue 370 intravenously. COMPARISON:  Radiograph of same day.  CT scan of June 22, 2016. FINDINGS: Cardiovascular: There is no definite evidence of pulmonary embolus. Atherosclerosis of aorta is noted without aneurysm formation. No pericardial effusion is noted. Mediastinum/Nodes: 5.1 x 3.7 cm right peritracheal mass is noted consistent with metastatic disease. Lungs/Pleura: Emphysematous disease is noted throughout both lungs. 6.9 x 4.3 cm cavitating mass is noted in the right middle lobe consistent with malignancy. 6 mm nodule is noted in right middle lobe best seen on image number 47 of series 7. New 9 mm nodule is noted in left upper lobe best seen on image number 32 of series 7. 9 mm left apical nodule is noted best seen on image number 20 of series 7 which is new and consistent with metastatic disease. Upper Abdomen: Bilateral nonobstructive nephrolithiasis is noted. Musculoskeletal: Mildly displaced right rib fracture is noted. Mildly displaced left posterior rib  fracture is noted. Lytic lesion is seen in the medial portion of the left clavicle consistent with metastatic disease. Lytic lesion is noted in lower thoracic vertebral body consistent with metastatic disease. Review of the MIP images confirms the above findings. IMPRESSION: No definite evidence of pulmonary embolus. 6.9 x 4.3 cm cavitating mass is noted in right middle lobe which is significantly increased in size compared to prior exam and consistent with malignancy. Right peritracheal adenopathy is noted as well as bilateral pulmonary nodules are noted concerning for metastatic disease. Bilateral nonobstructive nephrolithiasis. Mildly displaced bilateral rib fractures are noted. Lytic lesions are noted in the left clavicle and lower thoracic vertebral body consistent with metastatic disease. Aortic Atherosclerosis (ICD10-I70.0) and Emphysema (ICD10-J43.9). Electronically Signed   By: Marijo Conception, M.D.   On: 11/23/2016 21:45   Dg Chest Port 1 View  Result Date: 10/24/2016 CLINICAL DATA:  Hypoxia EXAM: PORTABLE CHEST 1 VIEW COMPARISON:  Chest CT June 22, 2016 and chest radiographs December 11 2016 FINDINGS: There is widespread pulmonary fibrosis. There is an area of consolidation concerning for mass in the anterior segment right upper lobe. This area measures 7.3 x 5.8 cm. Heart size and pulmonary vascularity are normal. There is evidence of right azygos, right paratracheal, and right perihilar adenopathy. There is postoperative change in the lower cervical spine. IMPRESSION: Widespread pulmonary fibrosis. Area of masslike consolidation concerning for neoplasm right upper lobe anterior segment. Adenopathy right hilar, as is common paratracheal regions. Stable cardiac silhouette. Electronically Signed   By: Lowella Grip III M.D.   On: 11/03/2016 21:34   Ct Renal Stone Study  Result Date: 11/02/2016 CLINICAL DATA:  Hematuria. EXAM: CT ABDOMEN AND PELVIS WITHOUT CONTRAST TECHNIQUE: Multidetector CT  imaging of the abdomen and pelvis was performed following the standard protocol without IV contrast. COMPARISON:  PET scan of November 18, 2015. FINDINGS: Lower chest: Extensive emphysematous disease is seen in the lung bases. Hepatobiliary: No focal liver abnormality is seen. No gallstones, gallbladder wall thickening, or biliary dilatation. Pancreas: Unremarkable. No pancreatic ductal dilatation or surrounding inflammatory changes. Spleen: Normal in size without focal abnormality. Adrenals/Urinary Tract: Adrenal glands are  unremarkable. Stable right renal cyst is noted. No hydronephrosis or renal obstruction is noted. Urinary bladder is full of contrast, but otherwise unremarkable. Stomach/Bowel: Stomach is within normal limits. Appendix appears normal. No evidence of bowel wall thickening, distention, or inflammatory changes. Vascular/Lymphatic: Aortic atherosclerosis. No enlarged abdominal or pelvic lymph nodes. Reproductive: Prostate is unremarkable. Other: No abdominal wall hernia or abnormality. No abdominopelvic ascites. Musculoskeletal: Lytic lesions are noted in the T11 and T12 vertebral bodies, as well as the right sacrum and right iliac bone consistent with metastatic disease. IMPRESSION: Osseous metastases are noted in the spine and pelvis. Aortic atherosclerosis. No hydronephrosis or renal obstruction is noted. Electronically Signed   By: Marijo Conception, M.D.   On: 11/15/2016 22:40    EKG: Independently reviewed. Vent. rate 115 BPM PR interval * ms QRS duration 93 ms QT/QTc 300/415 ms P-R-T axes 0 91 243 Sinus tachycardia Multiple premature complexes, vent & supraven Consider RVH w/ secondary repol abnormality Nonspecific repol abnormality, lateral leads  Assessment/Plan Principal Problem:   Sepsis secondary to UTI ([place]) Admit to stepdown/inpatient. Continue supplemental oxygen. Continue IV fluids. Continue Zosyn per pharmacy. Continue vancomycin per pharmacy  Active Problems:    NSTEMI (non-ST elevated myocardial infarction) [facility]) Discussed with cardiology by EDP. There will evaluate later in the morning. Trend troponin levels. Started on heparin infusion, which I will continue. 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 His lung mass is considerably larger on imaging and showing metastases now.  Consider palliative care evaluation later today.    PULMONARY FIBROSIS ILD POST INFLAMMATORY CHRONIC Continue supplemental oxygen. Continue BiPAP ventilation. Continue bronchodilators.    Type II diabetes mellitus, uncontrolled (HCC) Carbohydrate modified diet once patient is tolerating oral intake. CBG monitoring Regular Insulin sliding scale.    Essential hypertension Hold antihypertensives and monitor blood pressure.    Hyperlipidemia Hold Pravastatin for now.    Rheumatoid arthritis (HCC) Hold meloxicam and prednisone.   Solu-Medrol 40 mg IVP 1 dose. Switch to oral prednisone once the patient is more alert.     DVT prophylaxis: HEPARIN. Code Status: Full code. Family Communication:  Disposition Plan: Admit for IV antibiotic therapy, BiPAP ventilation and IV fluids. Consults called:  Admission status: Inpatient/stepdown.   Reubin Milan MD [facility] Pager [phone]  If 7PM-7AM, please contact night-coverage [email or web address] Password [email or web address]  11/22/2016, 11:15 PM

## 2016-11-13 NOTE — ED Notes (Signed)
Patient transported to CT scan . 

## 2016-11-13 NOTE — ED Notes (Signed)
Returned from CT scan , NS bolus infusing , Zosyn antibiotic IV infusing , pharmacy notified for pt.'s Vancomycin antibiotic.

## 2016-11-13 NOTE — ED Notes (Signed)
Pt. transported back to CT scan for CT renal stone .

## 2016-11-13 NOTE — ED Provider Notes (Signed)
[facility] DEPT Provider Note   CSN: [id number] Arrival date & time: 11/19/2016  2021     History   Chief Complaint Chief Complaint  Patient presents with  . Shortness of Breath    HPI [name] is a 66 y.o. male.  66 year old male with past medical history including COPD, right lung mass, type 2 diabetes mellitus, chronic respiratory failure pulmonary fibrosis, rheumatoid arthritis who presents with altered mental status and respiratory distress.per EMS, the patient was found down in his home by a friend. He was altered and had been incontinent of urine. EMS noted that he was hypoxic on their arrival. They noted generalized weakness. They gave him a nebulizer in route.  I later gathered some collateral information from the patient's friend and POA, Randy Hammitt. He states pt has been hypoxic recently at home and is likely not taking his medications. He states he has been doing poorly for a long time.   LEVEL 5 CAVEAT DUE TO AMS   The history is provided by the EMS personnel and a friend.    Past Medical History:  Diagnosis Date  . COPD (chronic obstructive pulmonary disease) ([place])   . Diabetes mellitus without complication ([place])   . Dyspnea   . GERD (gastroesophageal reflux disease)   . Hypertension   . Pulmonary fibrosis ([place])   . Rheumatoid arthritis(714.0) 2005    Patient Active Problem List   Diagnosis Date Noted  . Solitary pulmonary nodule 12/12/2015  . Pneumothorax after biopsy 12/07/2015  . Status post chest tube placement ([place]) 12/05/2015  . Pneumothorax, traumatic   . Chronic hypoxemic respiratory failure ([place]) 11/09/2015  . Encounter for long-term (current) use of high-risk medication 10/01/2014  . Immunosuppressed status ([place]) 07/16/2014  . Polypharmacy 07/16/2014  . Uncontrolled type 2 diabetes mellitus with ketoacidosis without coma, without long-term current use of insulin ([place]) 07/16/2014  . Encounter for therapeutic drug monitoring  03/29/2014  . Coronary artery calcification 02/02/2014  . Dyspnea 12/31/2013  . 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 Pulmonary nodules 12/03/2013  . Shingles 10/09/2013  . Post herpetic neuralgia 10/09/2013  . Rheumatoid arthritis ([place]) 06/26/2013  . Hyperlipidemia 01/28/2013  . Type II diabetes mellitus, uncontrolled ([place]) 01/27/2013  . Essential hypertension 01/27/2013  . COUGH 10/10/2009  . PULMONARY FIBROSIS ILD POST INFLAMMATORY CHRONIC 12/30/2007    Past Surgical History:  Procedure Laterality Date  . Excision mass R lateral arm 2010  2010  . EYE SURGERY    . MASS EXCISION  03/08/2011   Procedure: EXCISION MASS;  Surgeon: Cammie Sickle., MD;  Location: [facility];  Service: Orthopedics;  Laterality: Right;  excision rheumatoid nodule right ulna  . NECK SURGERY         Home Medications    Prior to Admission medications   Medication Sig Start Date End Date Taking? Authorizing Provider  albuterol (VENTOLIN HFA) 108 (90 BASE) MCG/ACT inhaler Inhale 2 puffs into the lungs every 4 (four) hours as needed for wheezing or shortness of breath. 11/20/13   Shawnee Knapp, MD  aspirin 81 MG tablet Take 81 mg by mouth daily.    [provider]  azithromycin (ZITHROMAX) 250 MG tablet Take as directed 06/20/16   Brand Males, MD  Blood Glucose Monitoring Suppl (BAYER CONTOUR MONITOR) w/Device KIT     Sig: TEST BLOOD SUGAR DAILY. DX CODE: E11.65  12/15/15   Pamelia Hoit, MD  buPROPion (WELLBUTRIN XL) 150 MG 24 hr tablet Take 2  tablets (300 mg total) by mouth daily. 12/08/15   Whiteheart, Cristal Ford, NP  gabapentin (NEURONTIN) 400 MG capsule TAKE 2 CAPSULES (800 MG TOTAL) BY MOUTH 3 (THREE) TIMES DAILY. 06/04/16   Shawnee Knapp, MD  glucose blood (BAYER CONTOUR NEXT TEST) test strip TEST BLOOD SUGAR DAILY. DX CODE: E11.65 12/15/15   Pamelia Hoit, MD  ipratropium-albuterol (DUONEB) 0.5-2.5 (3) MG/3ML SOLN TAKE 3 MLS BY NEBULIZATION EVERY 4 (FOUR)  HOURS AS NEEDED. Patient taking differently: TAKE 3 MLS BY NEBULIZATION EVERY 4 (FOUR) HOURS AS NEEDED FOR WHEEZING 09/27/15   Shawnee Knapp, MD  losartan (COZAAR) 50 MG tablet TAKE 1 TABLET BY MOUTH EVERY DAY FOR BLOOD PRESSURE 08/19/16   Shawnee Knapp, MD  meloxicam (MOBIC) 7.5 MG tablet Take 1 tablet (7.5 mg total) by mouth daily as needed for pain. Do not use with any other otc pain medication other than acetaminophen 03/18/15   Shawnee Knapp, MD  metFORMIN (GLUCOPHAGE) 500 MG tablet Take 1 tablet (500 mg total) by mouth 2 (two) times daily with a meal. **Need office visit for any further refills** 10/20/16   Shawnee Knapp, MD  mycophenolate (CELLCEPT) 500 MG tablet TAKE 2 TABLETS TWICE DAILY 1 HOUR BEFORE OR 2 HOURS  AFTER  MEAL 08/23/15   Brand Males, MD  pravastatin (PRAVACHOL) 40 MG tablet TAKE 1 TABLET BY MOUTH AT BEDTIME 05/21/16   Harrison Mons, PA-C  pravastatin (PRAVACHOL) 40 MG tablet TAKE 1 TABLET BY MOUTH AT BEDTIME 08/19/16   Shawnee Knapp, MD  predniSONE (DELTASONE) 10 MG tablet 40 mg x1 day, then 30 mg x1 day, then 20 mg x1 day, then 10 mg x1 day, and then 5 mg x1 day and stop 06/20/16   Brand Males, MD  sulfamethoxazole-trimethoprim (BACTRIM DS,SEPTRA DS) 800-160 MG tablet TAKE 1 TABLET BY MOUTH 3 (THREE) TIMES A WEEK. 09/03/16   Brand Males, MD    Family History Family History  Problem Relation Age of Onset  . Cancer Father   . Rheum arthritis Sister   . Lung disease Neg Hx     Social History Social History  Substance Use Topics  . Smoking status: Former Smoker    Packs/day: 0.50    Years: 39.00    Start date: 02/27/1969    Quit date: 01/25/2008  . Smokeless tobacco: Never Used  . Alcohol use No     Allergies   Patient has no known allergies.   Review of Systems Review of Systems  Unable to perform ROS: Mental status change     Physical Exam Updated Vital Signs BP 124/82   Pulse 94   Temp 98.6 F (37 C) (Rectal)   Resp (!) 30   Ht '5\' 10"'  (1.778 m)    Wt 83.9 kg (185 lb)   SpO2 100%   BMI 26.54 kg/m   Physical Exam  Constitutional: He appears distressed.  Frail, chronically ill appearing male in respiratory distress  HENT:  Head: Normocephalic and atraumatic.  Moist mucous membranes  Eyes: Pupils are equal, round, and reactive to light. Conjunctivae are normal.  Neck: Neck supple.  Cardiovascular: Regular rhythm and normal heart sounds.  Tachycardia present.   Pulmonary/Chest:  Tachypnea with moderate respiratory distress, significantly diminished BS R lung R chest wall significantly bowed out compared to left anterior chest  Abdominal: Soft. Bowel sounds are normal. He exhibits no distension. There is no tenderness.  Musculoskeletal: He exhibits no edema.  Neurological:  Awake but disoriented, moving all  4 extremities equally  Skin: Skin is warm and dry.  Covered in stool and urine  Psychiatric: He has a normal mood and affect. Judgment normal.  Nursing note and vitals reviewed.    ED Treatments / Results  Labs (all labs ordered are listed, but only abnormal results are displayed) Labs Reviewed  COMPREHENSIVE METABOLIC PANEL - Abnormal; Notable for the following:       Result Value   Potassium 2.8 (*)    Glucose, Bld 164 (*)    BUN 40 (*)    Creatinine, Ser 1.26 (*)    Calcium 12.4 (*)    Albumin 3.0 (*)    AST 58 (*)    Alkaline Phosphatase 148 (*)    GFR calc non Af Amer 57 (*)    Anion gap 17 (*)    All other components within normal limits  ACETAMINOPHEN LEVEL - Abnormal; Notable for the following:    Acetaminophen (Tylenol), Serum <10 (*)    All other components within normal limits  CBC WITH DIFFERENTIAL/PLATELET - Abnormal; Notable for the following:    WBC 25.4 (*)    Hemoglobin 12.5 (*)    RDW 16.8 (*)    Platelets 527 (*)    Neutro Abs 22.8 (*)    Monocytes Absolute 1.3 (*)    All other components within normal limits  URINALYSIS, ROUTINE W REFLEX MICROSCOPIC - Abnormal; Notable for the  following:    APPearance CLOUDY (*)    Hgb urine dipstick LARGE (*)    Ketones, ur 20 (*)    Protein, ur 30 (*)    Leukocytes, UA LARGE (*)    Bacteria, UA MANY (*)    All other components within normal limits  CBG MONITORING, ED - Abnormal; Notable for the following:    Glucose-Capillary 164 (*)    All other components within normal limits  I-STAT TROPONIN, ED - Abnormal; Notable for the following:    Troponin i, poc 3.44 (*)    All other components within normal limits  I-STAT CG4 LACTIC ACID, ED - Abnormal; Notable for the following:    Lactic Acid, Venous 5.74 (*)    All other components within normal limits  I-STAT ARTERIAL BLOOD GAS, ED - Abnormal; Notable for the following:    pH, Arterial 7.474 (*)    pO2, Arterial 79.0 (*)    Bicarbonate 31.4 (*)    Acid-Base Excess 7.0 (*)    All other components within normal limits  I-STAT CHEM 8, ED - Abnormal; Notable for the following:    Potassium 2.7 (*)    BUN 42 (*)    Glucose, Bld 165 (*)    Calcium, Ion 1.46 (*)    All other components within normal limits  URINE CULTURE  CULTURE, BLOOD (ROUTINE X 2)  CULTURE, BLOOD (ROUTINE X 2)  ETHANOL  SALICYLATE LEVEL  CK  BRAIN NATRIURETIC PEPTIDE    EKG  EKG Interpretation  Date/Time:  Tuesday November 13 2016 20:31:46 EDT Ventricular Rate:  115 PR Interval:    QRS Duration: 93 QT Interval:  300 QTC Calculation: 415 R Axis:   91 Text Interpretation:  Sinus tachycardia Multiple premature complexes, vent & supraven Consider RVH w/ secondary repol abnormality Nonspecific repol abnormality, lateral leads Artifact appears to be A fib given irregularly irregular, P waves not consistent-- needs repeat Confirmed by Theotis Burrow [phone]) on 11/16/2016 9:11:23 PM       Radiology Ct Head Wo Contrast  Result Date: 11/02/2016 CLINICAL DATA:  Found on floor by a friend. Altered level of consciousness. Urinary incontinence and weakness. EXAM: CT HEAD WITHOUT CONTRAST TECHNIQUE:  Contiguous axial images were obtained from the base of the skull through the vertex without intravenous contrast. COMPARISON:  Chest CT exams 12/05/2015 and 06/22/2016. FINDINGS: Brain: No evidence of acute infarction, hemorrhage, hydrocephalus, extra-axial collection or mass lesion/mass effect. Minimal small vessel ischemic disease of periventricular white matter. Small chronic appearing right cerebellar infarct, series 3, image 8. Vascular: No hyperdense vessels. Moderate atherosclerosis of the cavernous carotids bilaterally. Skull: Right parietal slightly expansile lytic lesion measuring approximately 13 x 11 x 11 mm near the vertex of the skull is concerning for lytic metastasis in light of pulmonary findings on prior chest CT exams from 06/22/2016 and 12/05/2015. No acute fracture. Sinuses/Orbits: Evidence of remote right orbital floor repair. No acute sinus disease. Intact globes. Other: None IMPRESSION: 1. There is a 13 x 11 x 11 mm slightly expansile lytic lesion involving the right parietal skull near the vertex. This in conjunction with pulmonary findings on prior CT exams raise concern for lytic metastasis. Less likely posttraumatic or infectious etiologies. 2. Minimal small vessel ischemic disease of periventricular white matter. Chronic tiny right cerebellar infarct. 3. No acute intracranial abnormality or focal mass on this unenhanced study. Electronically Signed   By: Ashley Royalty M.D.   On: 10/26/2016 21:40   Ct Angio Chest Pe W/cm &/or Wo Cm  Result Date: 11/12/2016 CLINICAL DATA:  Shortness of breath. EXAM: CT ANGIOGRAPHY CHEST WITH CONTRAST TECHNIQUE: Multidetector CT imaging of the chest was performed using the standard protocol during bolus administration of intravenous contrast. Multiplanar CT image reconstructions and MIPs were obtained to evaluate the vascular anatomy. CONTRAST:  84 mL of Isovue 370 intravenously. COMPARISON:  Radiograph of same day.  CT scan of June 22, 2016. FINDINGS:  Cardiovascular: There is no definite evidence of pulmonary embolus. Atherosclerosis of aorta is noted without aneurysm formation. No pericardial effusion is noted. Mediastinum/Nodes: 5.1 x 3.7 cm right peritracheal mass is noted consistent with metastatic disease. Lungs/Pleura: Emphysematous disease is noted throughout both lungs. 6.9 x 4.3 cm cavitating mass is noted in the right middle lobe consistent with malignancy. 6 mm nodule is noted in right middle lobe best seen on image number 47 of series 7. New 9 mm nodule is noted in left upper lobe best seen on image number 32 of series 7. 9 mm left apical nodule is noted best seen on image number 20 of series 7 which is new and consistent with metastatic disease. Upper Abdomen: Bilateral nonobstructive nephrolithiasis is noted. Musculoskeletal: Mildly displaced right rib fracture is noted. Mildly displaced left posterior rib fracture is noted. Lytic lesion is seen in the medial portion of the left clavicle consistent with metastatic disease. Lytic lesion is noted in lower thoracic vertebral body consistent with metastatic disease. Review of the MIP images confirms the above findings. IMPRESSION: No definite evidence of pulmonary embolus. 6.9 x 4.3 cm cavitating mass is noted in right middle lobe which is significantly increased in size compared to prior exam and consistent with malignancy. Right peritracheal adenopathy is noted as well as bilateral pulmonary nodules are noted concerning for metastatic disease. Bilateral nonobstructive nephrolithiasis. Mildly displaced bilateral rib fractures are noted. Lytic lesions are noted in the left clavicle and lower thoracic vertebral body consistent with metastatic disease. Aortic Atherosclerosis (ICD10-I70.0) and Emphysema (ICD10-J43.9). Electronically Signed   By: Marijo Conception, M.D.   On: 11/08/2016 21:45   Dg Chest  Port 1 View  Result Date: 11/12/2016 CLINICAL DATA:  Hypoxia EXAM: PORTABLE CHEST 1 VIEW COMPARISON:   Chest CT June 22, 2016 and chest radiographs December 11 2016 FINDINGS: There is widespread pulmonary fibrosis. There is an area of consolidation concerning for mass in the anterior segment right upper lobe. This area measures 7.3 x 5.8 cm. Heart size and pulmonary vascularity are normal. There is evidence of right azygos, right paratracheal, and right perihilar adenopathy. There is postoperative change in the lower cervical spine. IMPRESSION: Widespread pulmonary fibrosis. Area of masslike consolidation concerning for neoplasm right upper lobe anterior segment. Adenopathy right hilar, as is common paratracheal regions. Stable cardiac silhouette. Electronically Signed   By: Lowella Grip III M.D.   On: 11/05/2016 21:34   Ct Renal Stone Study  Result Date: 10/28/2016 CLINICAL DATA:  Hematuria. EXAM: CT ABDOMEN AND PELVIS WITHOUT CONTRAST TECHNIQUE: Multidetector CT imaging of the abdomen and pelvis was performed following the standard protocol without IV contrast. COMPARISON:  PET scan of November 18, 2015. FINDINGS: Lower chest: Extensive emphysematous disease is seen in the lung bases. Hepatobiliary: No focal liver abnormality is seen. No gallstones, gallbladder wall thickening, or biliary dilatation. Pancreas: Unremarkable. No pancreatic ductal dilatation or surrounding inflammatory changes. Spleen: Normal in size without focal abnormality. Adrenals/Urinary Tract: Adrenal glands are unremarkable. Stable right renal cyst is noted. No hydronephrosis or renal obstruction is noted. Urinary bladder is full of contrast, but otherwise unremarkable. Stomach/Bowel: Stomach is within normal limits. Appendix appears normal. No evidence of bowel wall thickening, distention, or inflammatory changes. Vascular/Lymphatic: Aortic atherosclerosis. No enlarged abdominal or pelvic lymph nodes. Reproductive: Prostate is unremarkable. Other: No abdominal wall hernia or abnormality. No abdominopelvic ascites. Musculoskeletal:  Lytic lesions are noted in the T11 and T12 vertebral bodies, as well as the right sacrum and right iliac bone consistent with metastatic disease. IMPRESSION: Osseous metastases are noted in the spine and pelvis. Aortic atherosclerosis. No hydronephrosis or renal obstruction is noted. Electronically Signed   By: Marijo Conception, M.D.   On: 11/15/2016 22:40    Procedures .Critical Care Performed by: Sharlett Iles Authorized by: Sharlett Iles   Critical care provider statement:    Critical care time (minutes):  60   Critical care time was exclusive of:  Separately billable procedures and treating other patients   Critical care was necessary to treat or prevent imminent or life-threatening deterioration of the following conditions:  Respiratory failure and sepsis   Critical care was time spent personally by me on the following activities:  Development of treatment plan with patient or surrogate, evaluation of patient's response to treatment, examination of patient, obtaining history from patient or surrogate, ordering and performing treatments and interventions, ordering and review of laboratory studies, ordering and review of radiographic studies, re-evaluation of patient's condition and review of old charts    (including critical care time)  Medications Ordered in ED Medications  albuterol (PROVENTIL,VENTOLIN) solution continuous neb (0 mg/hr Nebulization Stopped 11/18/2016 2248)  iopamidol (ISOVUE-370) 76 % injection (not administered)  vancomycin (VANCOCIN) 1,500 mg in sodium chloride 0.9 % 500 mL IVPB (1,500 mg Intravenous New Bag/Given 10/25/2016 2131)  vancomycin (VANCOCIN) IVPB 1000 mg/200 mL premix (not administered)  piperacillin-tazobactam (ZOSYN) IVPB 3.375 g (not administered)  iopamidol (ISOVUE-370) 76 % injection 100 mL (not administered)  piperacillin-tazobactam (ZOSYN) IVPB 3.375 g (0 g Intravenous Stopped 11/19/2016 2147)  sodium chloride 0.9 % bolus 1,000 mL (0 mLs  Intravenous Stopped 11/17/2016 2247)     Initial  Impression / Assessment and Plan / ED Course  I have reviewed the triage vital signs and the nursing notes.  Pertinent labs & imaging results that were available during my care of the patient were reviewed by me and considered in my medical decision making (see chart for details).     PT brought in by EMS after he was found down, noted to be hypoxic. On arrival he was altered, in respiratory distress. Afebrile, tachycardic, normal BP. It was difficult to get an accurate oxygen saturation. Placed on BiPAP.chest x-ray shows diffuse pulmonary fibrosis and right upper lobe mass. Initial lactate 5.74. Initiated a code sepsis with blood and urine cultures, vancomycin and zosyn. Labs show K 2.8, trop 3.4 which I suspect may be demand ischemia, Cr 1.26, WBC 25.4. CK normal. UA w/ evidence of infection and blood. Obtained CTA of chest to rule out PE. CTA was negative for PE but does show worsening right lung mass with bone metastases. Head CT also shows metastatic disease health parietal skull. I obtained a renal stone study because of his hematuria. CT negative for kidney stone but does show bony metastases. The patient hasimproved on BiPAP. His power of attorney has stated that he did not want resuscitation but unfortunately we do not have this paperwork here and his friend is out of state currently. Discussed admission with Dr. Olevia Bowens, [facility]. We discussed the patient's elevated troponin and whether or not to initiate heparin drip. I contacted cardiology and discussed with Dr. Raiford Simmonds. He and I discussed risks including hemorrhage from a metastasis. I see no evidence of intracranial mass on head CT. ultimately he recommended initiation of heparin with hopes that we can have discussion of goals of care in the morning if patient's mental status improves.  I have ordered heparin drip and pt admitted for further care.  POA is RANDY HAMMITT [phone]  Final Clinical  Impressions(s) / ED Diagnoses   Final diagnoses:  Acute respiratory failure with hypoxia (HCC)  Altered mental status, unspecified altered mental status type  NSTEMI (non-ST elevated myocardial infarction) [facility])  Primary malignant neoplasm of right lung metastatic to other site [facility])    New Prescriptions New Prescriptions   No medications on file     Dawnn Nam, Wenda Overland, MD 11/05/2016 2353

## 2016-11-14 ENCOUNTER — Encounter (HOSPITAL_COMMUNITY): Payer: Self-pay

## 2016-11-14 ENCOUNTER — Inpatient Hospital Stay (HOSPITAL_COMMUNITY): Payer: Medicare Other

## 2016-11-14 DIAGNOSIS — E860 Dehydration: Secondary | ICD-10-CM | POA: Diagnosis present

## 2016-11-14 DIAGNOSIS — Z7189 Other specified counseling: Secondary | ICD-10-CM

## 2016-11-14 DIAGNOSIS — J9621 Acute and chronic respiratory failure with hypoxia: Secondary | ICD-10-CM

## 2016-11-14 DIAGNOSIS — E876 Hypokalemia: Secondary | ICD-10-CM | POA: Diagnosis present

## 2016-11-14 DIAGNOSIS — E1165 Type 2 diabetes mellitus with hyperglycemia: Secondary | ICD-10-CM

## 2016-11-14 DIAGNOSIS — J984 Other disorders of lung: Secondary | ICD-10-CM

## 2016-11-14 DIAGNOSIS — I248 Other forms of acute ischemic heart disease: Secondary | ICD-10-CM

## 2016-11-14 DIAGNOSIS — R4182 Altered mental status, unspecified: Secondary | ICD-10-CM

## 2016-11-14 DIAGNOSIS — C799 Secondary malignant neoplasm of unspecified site: Secondary | ICD-10-CM

## 2016-11-14 DIAGNOSIS — D649 Anemia, unspecified: Secondary | ICD-10-CM | POA: Diagnosis present

## 2016-11-14 DIAGNOSIS — J9601 Acute respiratory failure with hypoxia: Secondary | ICD-10-CM

## 2016-11-14 DIAGNOSIS — I10 Essential (primary) hypertension: Secondary | ICD-10-CM

## 2016-11-14 DIAGNOSIS — E118 Type 2 diabetes mellitus with unspecified complications: Secondary | ICD-10-CM

## 2016-11-14 DIAGNOSIS — G9340 Encephalopathy, unspecified: Secondary | ICD-10-CM

## 2016-11-14 DIAGNOSIS — E870 Hyperosmolality and hypernatremia: Secondary | ICD-10-CM

## 2016-11-14 DIAGNOSIS — I214 Non-ST elevation (NSTEMI) myocardial infarction: Secondary | ICD-10-CM

## 2016-11-14 DIAGNOSIS — A419 Sepsis, unspecified organism: Secondary | ICD-10-CM

## 2016-11-14 DIAGNOSIS — Z515 Encounter for palliative care: Secondary | ICD-10-CM

## 2016-11-14 DIAGNOSIS — C3490 Malignant neoplasm of unspecified part of unspecified bronchus or lung: Secondary | ICD-10-CM | POA: Diagnosis present

## 2016-11-14 LAB — TROPONIN I
TROPONIN I: 4.6 ng/mL — AB (ref <0.03)
Troponin I: 4.47 ng/mL (ref <0.03)
Troponin I: 4.29 ng/mL (ref <0.03)
Troponin I: 3.05 ng/mL (ref <0.03)

## 2016-11-14 LAB — COMPREHENSIVE METABOLIC PANEL
ALK PHOS: 106 U/L (ref 38–126)
ALT: 25 U/L (ref 17–63)
AST: 33 U/L (ref 15–41)
Albumin: 2.3 g/dL — ABNORMAL LOW (ref 3.5–5.0)
Anion gap: 9 (ref 5–15)
BUN: 35 mg/dL — ABNORMAL HIGH (ref 6–20)
CALCIUM: 10.4 mg/dL — AB (ref 8.9–10.3)
CO2: 26 mmol/L (ref 22–32)
CREATININE: 0.94 mg/dL (ref 0.61–1.24)
Chloride: 112 mmol/L — ABNORMAL HIGH (ref 101–111)
Glucose, Bld: 115 mg/dL — ABNORMAL HIGH (ref 65–99)
Potassium: 3.4 mmol/L — ABNORMAL LOW (ref 3.5–5.1)
SODIUM: 147 mmol/L — AB (ref 135–145)
Total Bilirubin: 0.7 mg/dL (ref 0.3–1.2)
Total Protein: 5.8 g/dL — ABNORMAL LOW (ref 6.5–8.1)

## 2016-11-14 LAB — CBC WITH DIFFERENTIAL/PLATELET
Basophils Absolute: 0 10*3/uL (ref 0.0–0.1)
Basophils Relative: 0 %
Eosinophils Absolute: 0 10*3/uL (ref 0.0–0.7)
Eosinophils Relative: 0 %
HEMATOCRIT: 33.1 % — AB (ref 39.0–52.0)
HEMOGLOBIN: 10 g/dL — AB (ref 13.0–17.0)
LYMPHS ABS: 1.6 10*3/uL (ref 0.7–4.0)
LYMPHS PCT: 7 %
MCH: 27.5 pg (ref 26.0–34.0)
MCHC: 30.2 g/dL (ref 30.0–36.0)
MCV: 90.9 fL (ref 78.0–100.0)
Monocytes Absolute: 1.2 10*3/uL — ABNORMAL HIGH (ref 0.1–1.0)
Monocytes Relative: 5 %
NEUTROS ABS: 19.6 10*3/uL — AB (ref 1.7–7.7)
NEUTROS PCT: 88 %
Platelets: 388 10*3/uL (ref 150–400)
RBC: 3.64 MIL/uL — AB (ref 4.22–5.81)
RDW: 16.7 % — ABNORMAL HIGH (ref 11.5–15.5)
WBC: 22.4 10*3/uL — ABNORMAL HIGH (ref 4.0–10.5)

## 2016-11-14 LAB — GLUCOSE, CAPILLARY: Glucose-Capillary: 103 mg/dL — ABNORMAL HIGH (ref 65–99)

## 2016-11-14 LAB — LACTIC ACID, PLASMA
LACTIC ACID, VENOUS: 3.1 mmol/L — AB (ref 0.5–1.9)
LACTIC ACID, VENOUS: 3.7 mmol/L — AB (ref 0.5–1.9)
Lactic Acid, Venous: 1.6 mmol/L (ref 0.5–1.9)

## 2016-11-14 LAB — HEPARIN LEVEL (UNFRACTIONATED): Heparin Unfractionated: 0.13 IU/mL — ABNORMAL LOW (ref 0.30–0.70)

## 2016-11-14 LAB — PHOSPHORUS
PHOSPHORUS: 3.1 mg/dL (ref 2.5–4.6)
Phosphorus: 2.5 mg/dL (ref 2.5–4.6)

## 2016-11-14 LAB — MAGNESIUM
Magnesium: 2.4 mg/dL (ref 1.7–2.4)
Magnesium: 2.1 mg/dL (ref 1.7–2.4)

## 2016-11-14 LAB — AMMONIA: Ammonia: 35 umol/L (ref 9–35)

## 2016-11-14 LAB — MRSA PCR SCREENING: MRSA by PCR: NEGATIVE

## 2016-11-14 MED ORDER — SODIUM CHLORIDE 0.9 % IV SOLN
500.0000 mg | Freq: Two times a day (BID) | INTRAVENOUS | Status: DC
  Administered 2016-11-14: 500 mg via INTRAVENOUS
  Filled 2016-11-14 (×2): qty 5

## 2016-11-14 MED ORDER — SODIUM CHLORIDE 0.9 % IV BOLUS (SEPSIS)
500.0000 mL | Freq: Once | INTRAVENOUS | Status: AC
  Administered 2016-11-14: 500 mL via INTRAVENOUS

## 2016-11-14 MED ORDER — POTASSIUM CHLORIDE CRYS ER 20 MEQ PO TBCR
40.0000 meq | EXTENDED_RELEASE_TABLET | Freq: Once | ORAL | Status: DC
  Filled 2016-11-14: qty 2

## 2016-11-14 MED ORDER — INSULIN ASPART 100 UNIT/ML ~~LOC~~ SOLN: 0.0000 [IU] | Freq: Three times a day (TID) | SUBCUTANEOUS | Status: DC

## 2016-11-14 MED ORDER — IPRATROPIUM BROMIDE 0.02 % IN SOLN
0.5000 mg | Freq: Four times a day (QID) | RESPIRATORY_TRACT | Status: DC
  Administered 2016-11-14: 0.5 mg via RESPIRATORY_TRACT

## 2016-11-14 MED ORDER — BUDESONIDE 0.25 MG/2ML IN SUSP
0.2500 mg | Freq: Two times a day (BID) | RESPIRATORY_TRACT | Status: DC
  Administered 2016-11-14: 0.25 mg via RESPIRATORY_TRACT
  Filled 2016-11-14: qty 2

## 2016-11-14 MED ORDER — ACETAMINOPHEN 325 MG PO TABS: 650.0000 mg | ORAL_TABLET | Freq: Four times a day (QID) | ORAL | Status: DC | PRN

## 2016-11-14 MED ORDER — ASPIRIN EC 81 MG PO TBEC
81.0000 mg | DELAYED_RELEASE_TABLET | Freq: Every day | ORAL | Status: DC
  Filled 2016-11-14: qty 1

## 2016-11-14 MED ORDER — GABAPENTIN 400 MG PO CAPS
800.0000 mg | ORAL_CAPSULE | Freq: Three times a day (TID) | ORAL | Status: DC
  Filled 2016-11-14: qty 2

## 2016-11-14 MED ORDER — LORAZEPAM 2 MG/ML IJ SOLN: 1.0000 mg | INTRAMUSCULAR | Status: DC | PRN

## 2016-11-14 MED ORDER — ONDANSETRON HCL 4 MG PO TABS: 4.0000 mg | ORAL_TABLET | Freq: Four times a day (QID) | ORAL | Status: DC | PRN

## 2016-11-14 MED ORDER — SODIUM CHLORIDE 0.9 % IV BOLUS (SEPSIS): 500.0000 mL | Freq: Once | INTRAVENOUS | Status: DC

## 2016-11-14 MED ORDER — LEVALBUTEROL HCL 0.63 MG/3ML IN NEBU
0.6300 mg | INHALATION_SOLUTION | Freq: Three times a day (TID) | RESPIRATORY_TRACT | Status: DC
  Administered 2016-11-14: 0.63 mg via RESPIRATORY_TRACT
  Filled 2016-11-14: qty 3

## 2016-11-14 MED ORDER — ONDANSETRON HCL 4 MG/2ML IJ SOLN: 4.0000 mg | Freq: Four times a day (QID) | INTRAMUSCULAR | Status: DC | PRN

## 2016-11-14 MED ORDER — LORAZEPAM 2 MG/ML IJ SOLN: 1.0000 mg | Freq: Once | INTRAMUSCULAR | Status: DC

## 2016-11-14 MED ORDER — HALOPERIDOL LACTATE 2 MG/ML PO CONC
0.5000 mg | ORAL | Status: DC | PRN
  Filled 2016-11-14: qty 0.3

## 2016-11-14 MED ORDER — MORPHINE BOLUS VIA INFUSION
2.0000 mg | INTRAVENOUS | Status: DC | PRN
  Administered 2016-11-14 (×4): 2 mg via INTRAVENOUS
  Filled 2016-11-14: qty 2

## 2016-11-14 MED ORDER — LORAZEPAM 2 MG/ML PO CONC: 1.0000 mg | ORAL | Status: DC | PRN

## 2016-11-14 MED ORDER — POLYVINYL ALCOHOL 1.4 % OP SOLN
1.0000 [drp] | Freq: Four times a day (QID) | OPHTHALMIC | Status: DC | PRN
  Filled 2016-11-14: qty 15

## 2016-11-14 MED ORDER — PRAVASTATIN SODIUM 40 MG PO TABS: 40.0000 mg | ORAL_TABLET | Freq: Every day | ORAL | Status: DC

## 2016-11-14 MED ORDER — DEXTROSE-NACL 5-0.45 % IV SOLN
INTRAVENOUS | Status: DC
  Administered 2016-11-14: 10:00:00 via INTRAVENOUS
  Filled 2016-11-14 (×2): qty 1000

## 2016-11-14 MED ORDER — MORPHINE SULFATE (PF) 4 MG/ML IV SOLN
4.0000 mg | Freq: Once | INTRAVENOUS | Status: AC
  Administered 2016-11-14: 4 mg via INTRAVENOUS
  Filled 2016-11-14: qty 1

## 2016-11-14 MED ORDER — SODIUM CHLORIDE 0.9 % IV SOLN
2.0000 mg/h | INTRAVENOUS | Status: DC
  Administered 2016-11-14: 2 mg/h via INTRAVENOUS
  Filled 2016-11-14: qty 10

## 2016-11-14 MED ORDER — BUPROPION HCL ER (XL) 300 MG PO TB24
300.0000 mg | ORAL_TABLET | Freq: Every day | ORAL | Status: DC
Start: 2016-11-14 — End: 2016-11-15
  Filled 2016-11-14: qty 1

## 2016-11-14 MED ORDER — IPRATROPIUM BROMIDE 0.02 % IN SOLN
0.5000 mg | Freq: Three times a day (TID) | RESPIRATORY_TRACT | Status: DC
  Administered 2016-11-14: 0.5 mg via RESPIRATORY_TRACT
  Filled 2016-11-14: qty 2.5

## 2016-11-14 MED ORDER — OXYCODONE-ACETAMINOPHEN 5-325 MG PO TABS: 2.0000 | ORAL_TABLET | Freq: Once | ORAL | Status: DC

## 2016-11-14 MED ORDER — HALOPERIDOL 0.5 MG PO TABS
0.5000 mg | ORAL_TABLET | ORAL | Status: DC | PRN
  Filled 2016-11-14: qty 1

## 2016-11-14 MED ORDER — LORAZEPAM 1 MG PO TABS: 1.0000 mg | ORAL_TABLET | ORAL | Status: DC | PRN

## 2016-11-14 MED ORDER — ACETAMINOPHEN 650 MG RE SUPP: 650.0000 mg | Freq: Four times a day (QID) | RECTAL | Status: DC | PRN

## 2016-11-14 MED ORDER — IPRATROPIUM-ALBUTEROL 0.5-2.5 (3) MG/3ML IN SOLN: 3.0000 mL | RESPIRATORY_TRACT | Status: DC | PRN

## 2016-11-14 MED ORDER — POTASSIUM CHLORIDE IN NACL 40-0.9 MEQ/L-% IV SOLN
INTRAVENOUS | Status: DC
  Administered 2016-11-14: 333 mL/h via INTRAVENOUS
  Filled 2016-11-14 (×2): qty 1000

## 2016-11-14 MED ORDER — ONDANSETRON 4 MG PO TBDP: 4.0000 mg | ORAL_TABLET | Freq: Four times a day (QID) | ORAL | Status: DC | PRN

## 2016-11-14 MED ORDER — OXYCODONE-ACETAMINOPHEN 5-325 MG PO TABS: 1.0000 | ORAL_TABLET | ORAL | Status: DC | PRN

## 2016-11-14 MED ORDER — MORPHINE SULFATE (PF) 2 MG/ML IV SOLN: 2.0000 mg | INTRAVENOUS | Status: DC | PRN

## 2016-11-14 MED ORDER — METHYLPREDNISOLONE SODIUM SUCC 40 MG IJ SOLR
40.0000 mg | Freq: Once | INTRAMUSCULAR | Status: AC
  Administered 2016-11-14: 40 mg via INTRAVENOUS
  Filled 2016-11-14: qty 1

## 2016-11-14 MED ORDER — MORPHINE BOLUS VIA INFUSION
2.0000 mg | Freq: Once | INTRAVENOUS | Status: AC
  Administered 2016-11-14: 2 mg via INTRAVENOUS
  Filled 2016-11-14: qty 2

## 2016-11-14 MED ORDER — GLYCOPYRROLATE 0.2 MG/ML IJ SOLN: 0.2000 mg | INTRAMUSCULAR | Status: DC | PRN

## 2016-11-14 MED ORDER — CARVEDILOL 3.125 MG PO TABS: 3.1250 mg | ORAL_TABLET | Freq: Two times a day (BID) | ORAL | Status: DC

## 2016-11-14 MED ORDER — LORAZEPAM 2 MG/ML IJ SOLN
2.0000 mg | INTRAMUSCULAR | Status: DC | PRN
  Administered 2016-11-14: 2 mg via INTRAVENOUS
  Filled 2016-11-14: qty 1

## 2016-11-14 MED ORDER — IPRATROPIUM BROMIDE 0.02 % IN SOLN
0.5000 mg | RESPIRATORY_TRACT | Status: DC | PRN
  Filled 2016-11-14: qty 2.5

## 2016-11-14 MED ORDER — LORAZEPAM 2 MG/ML IJ SOLN: 0.5000 mg | INTRAMUSCULAR | Status: DC | PRN

## 2016-11-14 MED ORDER — LEVALBUTEROL HCL 0.63 MG/3ML IN NEBU
0.6300 mg | INHALATION_SOLUTION | Freq: Four times a day (QID) | RESPIRATORY_TRACT | Status: DC
  Administered 2016-11-14: 0.63 mg via RESPIRATORY_TRACT

## 2016-11-14 MED ORDER — LEVALBUTEROL HCL 0.63 MG/3ML IN NEBU
0.6300 mg | INHALATION_SOLUTION | RESPIRATORY_TRACT | Status: DC | PRN
  Filled 2016-11-14: qty 3

## 2016-11-14 MED ORDER — GLYCOPYRROLATE 1 MG PO TABS
1.0000 mg | ORAL_TABLET | ORAL | Status: DC | PRN
  Filled 2016-11-14: qty 1

## 2016-11-14 MED ORDER — HALOPERIDOL LACTATE 5 MG/ML IJ SOLN: 0.5000 mg | INTRAMUSCULAR | Status: DC | PRN

## 2016-11-15 ENCOUNTER — Other Ambulatory Visit (HOSPITAL_COMMUNITY): Payer: Medicare Other

## 2016-11-15 NOTE — Progress Notes (Signed)
I ausculted absence of lung and heart sounds for a full minute and verified that the patient expired along with Alma Friendly. Lenna Sciara, RN

## 2016-11-15 NOTE — Progress Notes (Signed)
Sister notified of death via phone

## 2016-11-16 LAB — URINE CULTURE: Special Requests: NORMAL

## 2016-11-17 ENCOUNTER — Other Ambulatory Visit: Payer: Self-pay | Admitting: Family Medicine

## 2016-11-18 LAB — CULTURE, BLOOD (ROUTINE X 2)
CULTURE: NO GROWTH
Culture: NO GROWTH
Special Requests: ADEQUATE
Special Requests: ADEQUATE

## 2016-11-24 NOTE — Progress Notes (Signed)
ANTICOAGULATION CONSULT NOTE - Follow Up Consult  Pharmacy Consult for Heparin Indication: NSTEMI  No Known Allergies  Patient Measurements: Height: 5\' 10"  (177.8 cm) Weight: 185 lb (83.9 kg) IBW/kg (Calculated) : 73 Heparin Dosing Weight:  83.9 kg  Vital Signs: Temp: 98.4 F (36.9 C) (08/22 0751) Temp Source: Axillary (08/22 0751) BP: 123/89 (08/22 0339) Pulse Rate: 91 (08/22 0339)  Labs:  Recent Labs  10/26/2016 2035 11/21/2016 2045 12-09-2016 0033 12/09/2016 0206 2016-12-09 0722  HGB 12.5* 13.3  --   --  10.0*  HCT 39.6 39.0  --   --  33.1*  PLT 527*  --   --   --  388  HEPARINUNFRC  --   --   --   --  0.13*  CREATININE 1.26* 1.10  --   --   --   CKTOTAL 179  --   --   --   --   TROPONINI  --   --  4.60* 4.47*  --     Estimated Creatinine Clearance: 67.3 mL/min (by C-G formula based on SCr of 1.1 mg/dL).  Assessment: Anticoag: Starting heparin for elevated trop. HL 0.13. Hgb 12.5>10 overnight. Plts [id number]. Watch closely   Goal of Therapy:  Heparin level 0.3-0.7 units/ml Monitor platelets by anticoagulation protocol: Yes   Plan:  - Increase Iv heparin to 1250 units/hr. Recheck level in 6-8 hrs. - Continue daily HL and CBC   Macsen Nuttall S. Alford Highland, PharmD, BCPS Clinical Staff Pharmacist Pager [phone]  Eilene Ghazi Stillinger 2016-12-09,8:20 AM

## 2016-11-24 NOTE — Progress Notes (Signed)
Took off bipap per withdrawal of life sustaining orders protocol. Patient placed on 2L .

## 2016-11-24 NOTE — Discharge Summary (Signed)
Death Summary  [name]:[id number] DOB: 12/12/1950 DOA: 11/23/2016  PCP: Shawnee Knapp, MD PCP/Office notified:   Admit date: Nov 23, 2016 Date of Death: Dec 02, 2016  Final Diagnoses:  Principal Problem:   Sepsis secondary to UTI [facility]) Active Problems:   Acute respiratory failure with hypoxia ([place])   NSTEMI (non-ST elevated myocardial infarction) ([place])   PULMONARY FIBROSIS ILD POST INFLAMMATORY CHRONIC   Type II diabetes mellitus, uncontrolled ([place])   Essential hypertension   Hyperlipidemia   Rheumatoid arthritis ([place])   Pulmonary nodules   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 Olevia Bowens : [name] is a 66 y.o. male with medical history significant of COPD, type 2 diabetes, dyspnea, GERD, hypertension, pulmonary fibrosis, rheumatoid arthritis, metastatic right lung carcinoma who was brought via EMS to the emergency department after his friend found him on the floor, altered with dyspnea and urinary incontinence. He was hypoxic when EMS arrived. They provided him with supplemental oxygen and didn't add nebulizer treatment in route to the hospital. No further information was available, said that his friend, [name], who is his POA and has told him that he does not want treatment for his lung cancer.  ED Course: Initial vital signs temperature 98.46F, pulse 109, blood pressure 124/77 mmHg, respirations 35 and O2 sat 98% on room air. He received vancomycin, Zosyn, normal saline 2 L bolus, magnesium sulfate and was started on BiPAP ventilation. ABGs show a pH of 7.47, PCO2 of 43, PO2 of 79, bicarbonate 31 and O2 sat 96%. Urinalysis showed significant leukocyturia, hemoglobinuria and bacteriuria. WBC was 25.4, hemoglobin 12.5 g/dL  and platelets 527. Sodium 144, potassium 2.8, chloride 101, bicarbonate 26 and initial lactic acid 5.74 mmol/L. BUN was 40, creatinine 1.26, calcium 12.4 and glucose 164 mg/dL. AST was 58, alkaline phosphatase 148 and albumin 3.0, the rest of the LFTs were unremarkable. EtOH, acetaminophen and salicylate were within normal limits.  Imaging: CT chest angiogram showed cavitating mass in the right middle lobe, which considerably larger than on previous imaging. CT head showed right parietal lytic lesions consistent with metastases. CT renal showed multiple metastasis in the pelvis and spine.    Hospital Course:  #1 sepsis secondary to UTI Patient presented with altered mental status noted to have a significant leukocytosis with a white count of 25.4, urinalysis consistent with UTI, elevated lactic acid level, patient noted to be tachycardic and tachypnea on admission. Patient was pancultured and placed empirically on IV vancomycin and IV Zosyn. Patient's condition deteriorated. Patient was also noted to have a non-ST elevated MI, probable metastatic pulmonary disease. Palliative care was consulted and patient was transitioned to full comfort measures. Patient deteriorated however was kept comfortable. Patient pronounced dead at 2340 hrs. on 2016-11-24.   #2 acute on chronic respiratory failure with hypoxia Patient noted to be hypoxic on admission. Patient with no wheezing noted on exam. CT angiogram chest is negative for pulmonary emboli however does have a 6.9 x 4.3 cm cavitating mass in the right middle lobe which has significantly increased in size compared to prior exam and consistent with malignancy. Patient with right peritracheal adenopathy as well as bilateral pulmonary nodules concerning for metastatic disease. Patient with mildly displaced bilateral rib fractures,  lytic lesions noted in the left clavicle and lower thoracic vertebral body consistent with metastatic disease. CT pelvis also  consistent with osseous metastatic disease. CT head consistent with lytic lesions involving right parietal skull near the vertex. Patient admitted to stepdown unit and placed on the BiPAP.  Patient was being followed by pulmonary in the outpatient setting for pulmonary nodules with surgical pathology which was negative in September 2017. Patient interested in pulmonary evaluation. Pulmonary consultation was obtained. Palliative care consultation was also obtained. Patient was transitioned to full comfort measures.   #3 non-STEMI Patient with elevated troponins. Patient noted to have a EKG showing T-wave inversions in the anterior lateral leads. 2-D echo was ordered. Cardiology consultation obtained and was felt that due to patient's metastatic disease patient was likely not a candidate for any invasive cardiac workup. Patient remained asymptomatic. Palliative care was consulted and patient made full comfort measures.   #4 UTI Patient placed on IV Zosyn. Palliative care consultation obtained and patient was transitioned to comfort measures.   #5 hyperlipidemia Patient placed on Pravachol on initial admission.  #6 enlarging cavitary lung mass with lytic metastases Patient with enlarging cavitary lung mass noted on CT chest with osseous/lytic lesions noted on CT pelvis and CT head. Patient was being followed by pulmonary for pulmonary nodules which were biopsied September 2017 which were essentially negative at that time. Patient now with what seems like metastatic disease. Patient interested in further evaluation by pulmonary. Pulmonary was consulted and a weighted palliative care evaluation. Patient was seen by palliative care and patient transitioned to full comfort measures. Patient was made comfortable.   #7 hypernatremia Likely secondary to dehydration. Patient placed on IV fluids. 2-D echo was ordered. Patient was seen by palliative care and patient transitioned to full comfort  measures.  #8 dehydration Patient hydrated with IV fluids on admission.  #9 hypokalemia Repleted.  #10 prognosis  Patient noted to have a poor prognosis has had presented with a non-ST elevated MI, right enlarging lung mass with central cavitation and likely skeletal metastatic disease in addition to urosepsis. Cardiology was consulted and it was noted that patient did likely have an anterior lateral ischemia however due to his metastatic cancer and clinical status it was felt patient wasn't not going to be a candidate for any kind of invasive cardiac evaluation. Pulmonary was consulted and followed the patient during the hospitalization. Palliative care was also consulted who met with patient and in discussions with patient's sister patient was transitioned to full comfort measures. Patient was made a DO NOT RESUSCITATE. And patient was started on a morphine drip as well as Ativan as needed. Patient deteriorated was Comfortable and was pronounced dead on 12-11-2016 at 2340 hrs. May his soul rest in peace.   Time: 60 mins  Signed:  Ranbir Chew  [facility] 11/22/2016, 11:19 AM  No charge.

## 2016-11-24 NOTE — Progress Notes (Signed)
Called friend who is contact as patient is hypotensive now and respirations are very slow. No answer. RN in with paitent at this time providing comfort

## 2016-11-24 NOTE — Progress Notes (Signed)
CRITICAL VALUE STICKER  CRITICAL VALUE: Lactic 3.1  RECEIVER (on-site recipient of call): Justin  DATE & TIME NOTIFIED: 72  MESSENGER (representative from lab):  MD NOTIFIED:  TR[facility] paged   TIME OF NOTIFICATION: 0136  RESPONSE:

## 2016-11-24 NOTE — Consult Note (Signed)
Cardiology Consult    Patient ID: ULICE FOLLETT MRN: [id number], DOB/AGE: 07/03/1950   Admit date: 10/30/2016 Date of Consult: Nov 22, 2016  Primary Physician: Shawnee Knapp, MD Primary Cardiologist: [place] [phone]) Requesting Provider: Grandville Silos Reason for Consultation: Elevated Troponin  KASSEM KIBBE is a 66 y.o. male who is being seen today for the evaluation of elevated troponin at the request of Dr. Grandville Silos.   Patient Profile    66 year old male with past medical history of COPD, diabetes, hypertension, pulmonary fibrosis, RA, concern for metastatic lung carcinoma who presented to the ED after being found on the floor with altered mental status and dyspnea.  Past Medical History   Past Medical History:  Diagnosis Date  . COPD (chronic obstructive pulmonary disease) ([place])   . Diabetes mellitus without complication ([place])   . Dyspnea   . GERD (gastroesophageal reflux disease)   . Hypertension   . Pulmonary fibrosis ([place])   . Rheumatoid arthritis(714.0) 2005    Past Surgical History:  Procedure Laterality Date  . Excision mass R lateral arm 2010  2010  . EYE SURGERY    . MASS EXCISION  03/08/2011   Procedure: EXCISION MASS;  Surgeon: Cammie Sickle., MD;  Location: [place];  Service: Orthopedics;  Laterality: Right;  excision rheumatoid nodule right ulna  . NECK SURGERY       Allergies  No Known Allergies  History of Present Illness    Mr. Mccleod is a 66 yo male with PMH of COPD, diabetes, hypertension, pulmonary fibrosis, RA, and concern for metastatic lung carcinoma. History is obtained from the chart given patient's poor historian, and here with altered mental status. He is currently followed by pulmonology and outpatient setting for COPD and chronic hypoxemic respiratory failure as well as interstitial lung disease. Recently underwent biopsy on 12/05/15 with benign lung parenchyma, but no malignancy identified. Developed a pneumothorax as a result  of the needle biopsy that required hospitalization. He was followed up in the office with pulmonary on 12/12/15. Had repeat chest CT on 02/20/16 that showed right upper lung mass enlarging, and follow-up was recommended, does not appear that he followed up.  He presented to the ED on 11/23/2016 after being found by a friend on the floor, altered with dyspnea and urinary incontinence. He was hypoxic on EMS arrival and was provided with supplemental oxygen. He denies any chest pain, palpitations or dyspnea prior to this episode. In the ED his labs showed an ABG with pH of 7.4, PCO2 of 43, PO2 79, and bicarbonate of 31, WBC 25, hemoglobin 12, platelets 527, potassium 2.8 and lactic acid 5.7. He was afebrile, mildly tachycardic and normotensive. CT urogram showed a cavitating mass in the right middle lobe which was considerably larger than previous imaging. Head CT showed right parietal lytic lesions consistent with metastasis. Renal CT showed multiple metastatic this in the pelvis and spine. Troponin 4.4>> 4.2. EKG showed sinus rhythm with T-wave inversions in anterior lateral leads.  Inpatient Medications    . aspirin EC  81 mg Oral Daily  . budesonide (PULMICORT) nebulizer solution  0.25 mg Nebulization BID  . buPROPion  300 mg Oral Daily  . carvedilol  3.125 mg Oral BID WC  . gabapentin  800 mg Oral TID  . insulin aspart  0-9 Units Subcutaneous TID WC  . ipratropium  0.5 mg Nebulization TID  . levalbuterol  0.63 mg Nebulization TID  . potassium chloride  40 mEq Oral Once  .  pravastatin  40 mg Oral QHS    Family History    Family History  Problem Relation Age of Onset  . Cancer Father   . Rheum arthritis Sister   . Lung disease Neg Hx     Social History    Social History   Social History  . Marital status: Single    Spouse name: N/A  . Number of children: N/A  . Years of education: N/A   Occupational History  . retired   .  Retired   Social History Main Topics  . Smoking status:  Former Smoker    Packs/day: 0.50    Years: 39.00    Start date: 02/27/1969    Quit date: 01/25/2008  . Smokeless tobacco: Never Used  . Alcohol use No  . Drug use: No  . Sexual activity: No   Other Topics Concern  . Not on file   Social History Narrative   Single   Exercise: NO      [facility]:   Originally from [place]. Has previously lived in [place]. He attended school in [place]. He served in [place]. 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 Has a cat at home. No bird exposure. No mold exposure.         Review of Systems    See HPI All other systems reviewed and are otherwise negative except as noted above.  Physical Exam    Blood pressure 123/89, pulse 76, temperature 98.4 F (36.9 C), temperature source Axillary, resp. rate (!) 24, height 5\' 10"  (1.778 m), weight 185 lb (83.9 kg), SpO2 94 %.  General: Older ill appearing thin W male, currently on Bipap Psych: Normal affect. Neuro: Alert and oriented to self. HEENT: Normal  Neck: Supple, unable to assess due to Bipap. Lungs:  Labored and Diminished. Heart: RRR no s3, s4, systolic murmur. Abdomen: Soft, non-tender, non-distended, BS + x 4.  Extremities: No clubbing, cyanosis, mild LE edema. DP/PT/Radials 2+ and equal bilaterally.  Labs    Troponin [facility] Test)  Recent Labs  11/02/2016 2044  TROPIPOC 3.44*    Recent Labs  11/18/2016 2035 2016-12-13 0033 12/13/2016 0206 2016/12/13 0722  CKTOTAL 179  --   --   --   TROPONINI  --  4.60* 4.47* 4.29*   Lab Results  Component Value Date   WBC 22.4 (H) Dec 13, 2016   HGB 10.0 (L) 12/13/16   HCT 33.1 (L) December 13, 2016   MCV 90.9 12-13-16   PLT 388 2016/12/13    Recent Labs Lab December 13, 2016 0722  NA 147*  K 3.4*  CL 112*  CO2 26  BUN 35*  CREATININE 0.94  CALCIUM 10.4*  PROT 5.8*  BILITOT 0.7  ALKPHOS 106  ALT 25  AST 33  GLUCOSE 115*   Lab Results  Component Value Date     CHOL 132 11/10/2015   HDL 45 11/10/2015   LDLCALC 67 11/10/2015   TRIG 100 11/10/2015   No results found for: [facility]   Radiology Studies    Ct Head Wo Contrast  Result Date: 11/21/2016 CLINICAL DATA:  Found on floor by a friend. Altered level of consciousness. Urinary incontinence and weakness. EXAM: CT HEAD WITHOUT CONTRAST TECHNIQUE: Contiguous axial images were obtained from the base of the skull through the vertex without intravenous contrast. COMPARISON:  Chest CT exams 12/05/2015 and 06/22/2016. FINDINGS: Brain: No evidence of acute infarction, hemorrhage, hydrocephalus, extra-axial  collection or mass lesion/mass effect. Minimal small vessel ischemic disease of periventricular white matter. Small chronic appearing right cerebellar infarct, series 3, image 8. Vascular: No hyperdense vessels. Moderate atherosclerosis of the cavernous carotids bilaterally. Skull: Right parietal slightly expansile lytic lesion measuring approximately 13 x 11 x 11 mm near the vertex of the skull is concerning for lytic metastasis in light of pulmonary findings on prior chest CT exams from 06/22/2016 and 12/05/2015. No acute fracture. Sinuses/Orbits: Evidence of remote right orbital floor repair. No acute sinus disease. Intact globes. Other: None IMPRESSION: 1. There is a 13 x 11 x 11 mm slightly expansile lytic lesion involving the right parietal skull near the vertex. This in conjunction with pulmonary findings on prior CT exams raise concern for lytic metastasis. Less likely posttraumatic or infectious etiologies. 2. Minimal small vessel ischemic disease of periventricular white matter. Chronic tiny right cerebellar infarct. 3. No acute intracranial abnormality or focal mass on this unenhanced study. Electronically Signed   By: Ashley Royalty M.D.   On: 10/30/2016 21:40   Ct Angio Chest Pe W/cm &/or Wo Cm  Result Date: 11/09/2016 CLINICAL DATA:  Shortness of breath. EXAM: CT ANGIOGRAPHY CHEST WITH CONTRAST  TECHNIQUE: Multidetector CT imaging of the chest was performed using the standard protocol during bolus administration of intravenous contrast. Multiplanar CT image reconstructions and MIPs were obtained to evaluate the vascular anatomy. CONTRAST:  84 mL of Isovue 370 intravenously. COMPARISON:  Radiograph of same day.  CT scan of June 22, 2016. FINDINGS: Cardiovascular: There is no definite evidence of pulmonary embolus. Atherosclerosis of aorta is noted without aneurysm formation. No pericardial effusion is noted. Mediastinum/Nodes: 5.1 x 3.7 cm right peritracheal mass is noted consistent with metastatic disease. Lungs/Pleura: Emphysematous disease is noted throughout both lungs. 6.9 x 4.3 cm cavitating mass is noted in the right middle lobe consistent with malignancy. 6 mm nodule is noted in right middle lobe best seen on image number 47 of series 7. New 9 mm nodule is noted in left upper lobe best seen on image number 32 of series 7. 9 mm left apical nodule is noted best seen on image number 20 of series 7 which is new and consistent with metastatic disease. Upper Abdomen: Bilateral nonobstructive nephrolithiasis is noted. Musculoskeletal: Mildly displaced right rib fracture is noted. Mildly displaced left posterior rib fracture is noted. Lytic lesion is seen in the medial portion of the left clavicle consistent with metastatic disease. Lytic lesion is noted in lower thoracic vertebral body consistent with metastatic disease. Review of the MIP images confirms the above findings. IMPRESSION: No definite evidence of pulmonary embolus. 6.9 x 4.3 cm cavitating mass is noted in right middle lobe which is significantly increased in size compared to prior exam and consistent with malignancy. Right peritracheal adenopathy is noted as well as bilateral pulmonary nodules are noted concerning for metastatic disease. Bilateral nonobstructive nephrolithiasis. Mildly displaced bilateral rib fractures are noted. Lytic lesions  are noted in the left clavicle and lower thoracic vertebral body consistent with metastatic disease. Aortic Atherosclerosis (ICD10-I70.0) and Emphysema (ICD10-J43.9). Electronically Signed   By: Marijo Conception, M.D.   On: 10/29/2016 21:45   Dg Chest Port 1 View  Result Date: 11/03/2016 CLINICAL DATA:  Hypoxia EXAM: PORTABLE CHEST 1 VIEW COMPARISON:  Chest CT June 22, 2016 and chest radiographs December 11 2016 FINDINGS: There is widespread pulmonary fibrosis. There is an area of consolidation concerning for mass in the anterior segment right upper lobe. This area measures 7.3  x 5.8 cm. Heart size and pulmonary vascularity are normal. There is evidence of right azygos, right paratracheal, and right perihilar adenopathy. There is postoperative change in the lower cervical spine. IMPRESSION: Widespread pulmonary fibrosis. Area of masslike consolidation concerning for neoplasm right upper lobe anterior segment. Adenopathy right hilar, as is common paratracheal regions. Stable cardiac silhouette. Electronically Signed   By: Lowella Grip III M.D.   On: 10/28/2016 21:34   Ct Renal Stone Study  Result Date: 11/20/2016 CLINICAL DATA:  Hematuria. EXAM: CT ABDOMEN AND PELVIS WITHOUT CONTRAST TECHNIQUE: Multidetector CT imaging of the abdomen and pelvis was performed following the standard protocol without IV contrast. COMPARISON:  PET scan of November 18, 2015. FINDINGS: Lower chest: Extensive emphysematous disease is seen in the lung bases. Hepatobiliary: No focal liver abnormality is seen. No gallstones, gallbladder wall thickening, or biliary dilatation. Pancreas: Unremarkable. No pancreatic ductal dilatation or surrounding inflammatory changes. Spleen: Normal in size without focal abnormality. Adrenals/Urinary Tract: Adrenal glands are unremarkable. Stable right renal cyst is noted. No hydronephrosis or renal obstruction is noted. Urinary bladder is full of contrast, but otherwise unremarkable. Stomach/Bowel:  Stomach is within normal limits. Appendix appears normal. No evidence of bowel wall thickening, distention, or inflammatory changes. Vascular/Lymphatic: Aortic atherosclerosis. No enlarged abdominal or pelvic lymph nodes. Reproductive: Prostate is unremarkable. Other: No abdominal wall hernia or abnormality. No abdominopelvic ascites. Musculoskeletal: Lytic lesions are noted in the T11 and T12 vertebral bodies, as well as the right sacrum and right iliac bone consistent with metastatic disease. IMPRESSION: Osseous metastases are noted in the spine and pelvis. Aortic atherosclerosis. No hydronephrosis or renal obstruction is noted. Electronically Signed   By: Marijo Conception, M.D.   On: 10/26/2016 22:40    ECG & Cardiac Imaging    EKG: Sinus rhythm with T-wave inversions in anterior lateral leads  Assessment & Plan    66 year old male with past medical history of COPD, diabetes, hypertension, pulmonary fibrosis, RA, concern for metastatic lung carcinoma who presented to the ED after being found on the floor with altered mental status and dyspnea.  1. Elevated Troponin: Denies any chest pain, dyspnea or palpitations prior to admission. EKG shows sinus rhythm with T-wave inversions in anterior lateral leads which are new from previous tracings. Troponin 4.4>> 4.2. Patient currently denies any chest pain. Elevated troponin in the setting of sepsis, possible metastatic disease, and acute on chronic respiratory failure. Did have a CT chest 3/18 which noted mild coronary artery calcifications, so suspect some degree of heart disease. At this time given his current state would not consider him appropriate candidate for invasive testing. Can consider optimizing medical therapy if improves past this acute state. -- Echo pending  2. Sepsis 2/2 to UTI: Lactic acid on admission 5, but now trending down. Currently on IV fluids, antibiotics. Plan per primary  3. Acute on chronic respiratory failure with hypoxia:  CTA scan negative for PE, but noted larger cavitating mass in right middle lobe consistent with malignancy. CT head pelvis and renal concerning for metastatic disease. Currently followed by pulmonary and outpatient setting. Remains on BiPAP  4. Altered mental status: Reportedly found down on the floor by a friend at home. Was noted to be dyspneic and urinary incontinent. Currently undergoing EEG at the time of assessment. Management per primary  5. Cavitary lung mass: Appears work up for mass started back in 9/17 with pulmonary following. CTA this admission shows enlargement from previous scans on 3/18. Appears pulmonary has been consult for  further evaluation. Consideration is being given to palliative care consult for goals of care.  Barnet Pall, NP-C Pager [phone] 12/13/16, 10:43 AM  Patient seen, examined. Available data reviewed. Agree with findings, assessment, and plan as outlined by Bing Neighbors, NP-C. On my exam today: Vitals:   12-13-16 1140 12-13-2016 1324  BP: 109/71   Pulse:  75  Resp: 17 19  Temp:    SpO2:  97%   Pt is asleep but easily awakened, on BiPap HEENT: normal Neck: JVP - normal, carotids 2+= without bruits Lungs: Diminished breath sounds bilaterally CV: RRR without murmur or gallop, distant heart sounds Abd: soft, NT, Positive BS, no hepatomegaly Ext: no C/C/E Skin: warm/dry no rash Neurologic: Confused, unable to provide meaningful history, no focal deficits identified  We are asked to see this patient because of elevated troponin levels. History is very difficult to obtain, but he denies chest pain and denies any history of chest pain. He cannot provide any clear history. His hospital documentation, radiographic data, and lab data are all reviewed. The patient does not have typical symptoms of acute coronary syndrome, but clearly is at high risk of obstructive coronary disease. His troponin elevation and EKG changes with ST/T-wave abnormality  suggestive of anterolateral ischemia are all consistent with ischemic heart disease. Considering the patient's metastatic cancer and current clinical status I do not think he will be a candidate for any kind of invasive cardiac evaluation. I suspect his care will be moving in more of a palliative approach. I think it is probably best to focus on his, for that this point unless something dramatically changes. I would not recommend even aggressive medical therapy such as a high intensity statin drug, anticoagulation or dual antiplatelet therapy, or other therapy since he is having no chest pain at this time. Would treat him supportively and await palliative care evaluation.  Sherren Mocha, M.D. 13-Dec-2016 4:30 PM

## 2016-11-24 NOTE — Consult Note (Signed)
[facility] Consult  Physician Requesting Consult:  Irine Seal, M.D. / [facility]  Date of Consult:  2016/12/08  Reason for Consult/Chief Complaint:  Metastatic Cancer  History of Presenting Illness:   History obtained from the electronic medical record as well as the patient's attending hospitalist given his altered mental status. 66 y.o. male known to me from previous office visit in 2017. Patient previously diagnosed with COPD and chronic hypoxic respiratory failure as well as postinflammatory interstitial lung disease. Follows with Dr. Chase Caller. Patient was found down with urinary incontinence at home. Upon arrival patient was noted to be hypoxic above his baseline. Patient was started on noninvasive positive pressure ventilation. Further workup revealed a non-ST elevation MI as well as enlargement of his right lung mass with central cavitation and further imaging findings consistent with metastatic disease. At bedside exam today patient denied any chest discomfort. He did report some difficulty breathing but was unable to provide further history.  Review of Systems:  Unable to obtain with altered mental status.  No Known Allergies  No current facility-administered medications on file prior to encounter.    Current Outpatient Prescriptions on File Prior to Encounter  Medication Sig Dispense Refill  . albuterol (VENTOLIN HFA) 108 (90 BASE) MCG/ACT inhaler Inhale 2 puffs into the lungs every 4 (four) hours as needed for wheezing or shortness of breath. 8.5 each 11  . aspirin 81 MG tablet Take 81 mg by mouth daily.    Marland Kitchen azithromycin (ZITHROMAX) 250 MG tablet Take as directed 6 tablet 0  . Blood Glucose Monitoring Suppl (BAYER CONTOUR MONITOR) w/Device KIT     Sig: TEST BLOOD SUGAR DAILY. DX CODE: E11.65  1 kit 0  . buPROPion (WELLBUTRIN XL) 150 MG 24 hr tablet Take 2 tablets (300 mg total) by mouth daily.    Marland Kitchen gabapentin (NEURONTIN) 400 MG capsule TAKE 2 CAPSULES (800  MG TOTAL) BY MOUTH 3 (THREE) TIMES DAILY. 540 capsule 1  . glucose blood (BAYER CONTOUR NEXT TEST) test strip TEST BLOOD SUGAR DAILY. DX CODE: E11.65 100 each 12  . ipratropium-albuterol (DUONEB) 0.5-2.5 (3) MG/3ML SOLN TAKE 3 MLS BY NEBULIZATION EVERY 4 (FOUR) HOURS AS NEEDED. (Patient taking differently: TAKE 3 MLS BY NEBULIZATION EVERY 4 (FOUR) HOURS AS NEEDED FOR WHEEZING) 360 mL 3  . losartan (COZAAR) 50 MG tablet TAKE 1 TABLET BY MOUTH EVERY DAY FOR BLOOD PRESSURE 90 tablet 0  . meloxicam (MOBIC) 7.5 MG tablet Take 1 tablet (7.5 mg total) by mouth daily as needed for pain. Do not use with any other otc pain medication other than acetaminophen 90 tablet 0  . metFORMIN (GLUCOPHAGE) 500 MG tablet Take 1 tablet (500 mg total) by mouth 2 (two) times daily with a meal. **Need office visit for any further refills** 60 tablet 0  . mycophenolate (CELLCEPT) 500 MG tablet TAKE 2 TABLETS TWICE DAILY 1 HOUR BEFORE OR 2 HOURS  AFTER  MEAL 120 tablet 5  . pravastatin (PRAVACHOL) 40 MG tablet TAKE 1 TABLET BY MOUTH AT BEDTIME 90 tablet 0  . pravastatin (PRAVACHOL) 40 MG tablet TAKE 1 TABLET BY MOUTH AT BEDTIME 90 tablet 0  . predniSONE (DELTASONE) 10 MG tablet 40 mg x1 day, then 30 mg x1 day, then 20 mg x1 day, then 10 mg x1 day, and then 5 mg x1 day and stop 11 tablet 0  . sulfamethoxazole-trimethoprim (BACTRIM DS,SEPTRA DS) 800-160 MG tablet TAKE 1 TABLET BY MOUTH 3 (THREE) TIMES A WEEK. 12 tablet 2  Past Medical History:  Diagnosis Date  . COPD (chronic obstructive pulmonary disease) ([place])   . Diabetes mellitus without complication ([place])   . Dyspnea   . GERD (gastroesophageal reflux disease)   . Hypertension   . Pulmonary fibrosis ([place])   . Rheumatoid arthritis(714.0) 2005    Past Surgical History:  Procedure Laterality Date  . Excision mass R lateral arm 2010  2010  . EYE SURGERY    . MASS EXCISION  03/08/2011   Procedure: EXCISION MASS;  Surgeon: Cammie Sickle., MD;  Location: [facility];  Service: Orthopedics;  Laterality: Right;  excision rheumatoid nodule right ulna  . NECK SURGERY      Family History  Problem Relation Age of Onset  . Cancer Father   . Rheum arthritis Sister   . Lung disease Neg Hx     Social History   Social History  . Marital status: Single    Spouse name: N/A  . Number of children: N/A  . Years of education: N/A   Occupational History  . retired   .  Retired   Social History Main Topics  . Smoking status: Former Smoker    Packs/day: 0.50    Years: 39.00    Start date: 02/27/1969    Quit date: 01/25/2008  . Smokeless tobacco: Never Used  . Alcohol use No  . Drug use: No  . Sexual activity: No   Other Topics Concern  . None   Social History Narrative   Single   Exercise: NO      [facility]:   Originally from [place]. Has previously lived in [place]. He attended school in [place]. He served in [place]. 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 Has a cat at home. No bird exposure. No mold exposure.        Temp:  [97.8 F (36.6 C)-98.7 F (37.1 C)] 98.7 F (37.1 C) (08/22 1138) Pulse Rate:  [71-110] 76 (08/22 1000) Resp:  [16-35] 17 (08/22 1140) BP: (102-133)/(64-94) 109/71 (08/22 1140) SpO2:  [91 %-100 %] 94 % (08/22 1000) FiO2 (%):  [40 %] 40 % (08/22 1000) Weight:  [185 lb (83.9 kg)] 185 lb (83.9 kg) (08/21 2100)\  General:  Sleeping until awoken. No acute distress. No family at bedside. Integument:  Warm & dry. No rash on exposed skin.  Lymphatics:  No appreciated cervical or supraclavicular lymphadenoapthy. HEENT:  No scleral injection or icterus. BiPAP mask in place. Cardiovascular:  Regular rate. No edema. No JVD appreciated.  Pulmonary:  Distant breath sounds bilaterally. Normal work of breathing on BiPAP with FiO2 0.4. Abdomen: Soft. Normal bowel sounds. Nondistended. Grossly nontender. Musculoskeletal:  Symmetrically  decreased muscle bulk. No joint deformity or effusion appreciated. Neurological:  Symmetric face. Patient attempted to answer questions. Psychiatric:  Confused. Not oriented to place, year, or president.  CBC Latest Ref Rng & Units 23-Nov-2016 11/19/2016 10/31/2016  WBC 4.0 - 10.5 K/uL 22.4(H) - 25.4(H)  Hemoglobin 13.0 - 17.0 g/dL 10.0(L) 13.3 12.5(L)  Hematocrit 39.0 - 52.0 % 33.1(L) 39.0 39.6  Platelets 150 - 400 K/uL 388 - 527(H)    BMP Latest Ref Rng & Units 23-Nov-2016 11/12/2016 11/04/2016  Glucose 65 - 99 mg/dL 115(H) 165(H) 164(H)  BUN 6 - 20 mg/dL 35(H) 42(H) 40(H)  Creatinine 0.61 - 1.24 mg/dL 0.94 1.10 1.26(H)  Sodium 135 - 145 mmol/L 147(H) 144 144  Potassium 3.5 -  5.1 mmol/L 3.4(L) 2.7(LL) 2.8(L)  Chloride 101 - 111 mmol/L 112(H) 101 101  CO2 22 - 32 mmol/L 26 - 26  Calcium 8.9 - 10.3 mg/dL 10.4(H) - 12.4(H)    IMAGING/STUDIES: PFT 04/11/15: FVC 2.62 L (59%) FEV1 2.36 L (71%) FEV1/FVC 0.90 FEF 25-75 4.27 L (165%) no bronchodilator response TLC 3.54 L (52%) RV 50% ERV 37% DLCO uncorrected 37% Walk Test 11/25/15:  Walked 1 lap / Baseline Sat 94% on 4 L/m at rest / Nadir Sat 91% on 4 L/m PET CT 11/18/15 (previously reviewed by me):  Right upper lobe nodule with max SUV 10.8. Maximal dimension 4.1cm. Clustered right paratracheal nodes largest measuring 0.9cm in short axis with maximum uptake 5.8. No other hypermetabolic foci.  No pleural effusion or thickening. No pericardial effusion.  CTA CHEST 8/21:  Personally reviewed by me. Pathologically enlarged anterior mediastinal lymph node precarinal. Bilateral fibrocystic changes noted with findings consistent with emphysema. 9 mm nodule in left upper lobe appears new. 6 mm right middle lobe nodule also noted. Approximately 7 cm cavitary mass within right middle/upper lobe. The lesion noted with an clavicle as well as mildly displaced right and left rib fractures. Lytic lesion also noted within vertebral body of thoracic spine. No evidence of  pulmonary embolism. No pleural effusion or thickening. CT HEAD W/O 8/21: IMPRESSION: 1. There is a 13 x 11 x 11 mm slightly expansile lytic lesion involving the right parietal skull near the vertex. This in conjunction with pulmonary findings on prior CT exams raise concern for lytic metastasis. Less likely posttraumatic or infectious etiologies. 2. Minimal small vessel ischemic disease of periventricular white matter. Chronic tiny right cerebellar infarct. 3. No acute intracranial abnormality or focal mass on this unenhanced study. EEG 8/22 >>>  MICROBIOLOGY: MRSA PCR 8/22:  Negative Blood Cultures x2 8/21 >>> Urine Culture 8/21 >>>  ANTIBIOTICS: Vancomycin 8/21 >>> Zosyn 8/21 >>>  ASSESSMENT/PLAN:  66 y.o. male with known history of chronic hypoxic respiratory failure, COPD, and postinflammatory interstitial lung disease. Cavitating lung mass and other findings on CT imaging noted above. I personally reviewed his chest CT scan which does show an anterior mediastinal mass which is likely a pathologically enlarged lymph node precarinal. There are no other findings on physical exam that would provide a potential alternative biopsy site. Biopsy of his vertebral lytic lesion would be possible but notoriously yields poor results. Given the patient's tenuous respiratory status endotracheal intubation for an endobronchial ultrasound biopsy would also be risky for the patient.  1. Acute on chronic hypoxic respiratory failure: Recommend gradually weaning patient's oxygen requirement back to baseline. 2. Cavitating lung mass: We will follow-up after discussions with palliative medicine to determine what, if any, biopsies need to be performed depending upon the patient's goals of care. 3. COPD: No evidence of acute exacerbation. Agree with continuing Pulmicort nebulized twice daily as well as Xopenex and Atrovent nebulizers 3 times a day.  Remainder of care as per primary service and other  consultants.  Sonia Baller Ashok Cordia, M.D. [facility] Pager:  [phone] After 3pm or if no response, call [phone] 12:16 PM 12-11-16

## 2016-11-24 NOTE — Procedures (Signed)
ELECTROENCEPHALOGRAM REPORT  Date of Study: 12/06/2016  Patient's Name: [name] MRN: [id number] Date of Birth: 06-06-1950  Referring Provider: Dr. Irine Seal  Clinical History: This is a 66 year old man with altered mental status.  Medications: levETIRAcetam (KEPPRA) 500 mg in sodium chloride 0.9 % 100 mL IVPB  gabapentin (NEURONTIN) capsule 800 mg  aspirin EC tablet 81 mg  budesonide (PULMICORT) nebulizer solution 0.25 mg  buPROPion (WELLBUTRIN XL) 24 hr tablet 300 mg  carvedilol (COREG) tablet 3.125 mg  dextrose 5 % and 0.45% NaCl 1,000 mL infusion  insulin aspart (novoLOG) injection 0-9 Units  iopamidol (ISOVUE-370) 76 % injection 100 mL  ipratropium (ATROVENT) nebulizer solution 0.5 mg  levalbuterol (XOPENEX) nebulizer solution 0.63 mg  piperacillin-tazobactam (ZOSYN) IVPB 3.375 g  potassium chloride SA (K-DUR,KLOR-CON) CR tablet 40 mEq  pravastatin (PRAVACHOL) tablet 40 mg  vancomycin (VANCOCIN) IVPB 1000 mg/200 mL premix   Technical Summary: A multichannel digital EEG recording measured by the international 10-20 system with electrodes applied with paste and impedances below 5000 ohms performed as portable with EKG monitoring in an awake and drowsy patient.  Hyperventilation and photic stimulation were not performed.  The digital EEG was referentially recorded, reformatted, and digitally filtered in a variety of bipolar and referential montages for optimal display.   Description: The patient is awake and drowsy during the recording. There is no clear posterior dominant rhythm. The background consists of a large amount of diffuse diffuse 4-5 Hz theta and 2-3 Hz delta slowing, that increases with drowsiness. Normal sleep architecture is not seen. Hyperventilation and photic stimulation were not performed.  There were no epileptiform discharges or electrographic seizures seen.    EKG lead showed irregular rhythm.  Impression: This awake and drowsy EEG is abnormal  due to mild to moderate diffuse slowing of the background.  Clinical Correlation of the above findings indicates diffuse cerebral dysfunction that is non-specific in etiology and can be seen with hypoxic/ischemic injury, toxic/metabolic encephalopathies, neurodegenerative disorders, or medication effect.  The absence of epileptiform discharges does not rule out a clinical diagnosis of epilepsy.  Clinical correlation is advised.   Ellouise Newer, M.D.

## 2016-11-24 NOTE — Consult Note (Signed)
Consultation Note Date: 2016/12/01   Patient Name: [name]  DOB: Mar 12, 1951  MRN: [id number]  Age / Sex: 66 y.o., male  PCP: Shawnee Knapp, MD Referring Physician: Eugenie Filler, MD  Reason for Consultation: Establishing goals of care and Withdrawal of life-sustaining treatment  HPI/Patient Profile: 66 y.o. male  with past medical history of COPD (on O2 compressor at home), DM2, GERD, pulmonary fibrosis, rheumatoid arthritis, metastatic R lung ca (clinically- not diagnosed pathologically) admitted on 10/31/2016 with altered mental status, respiratory distress, hypoxic. Workup reveals non-ST elevation MI, enlarging R lung mass with central cavitation and likely skeletal metastatic disease, urosepsis. Palliative medicine consulted for [place].   Clinical Assessment and Goals of Care: Evaluated patient. On Bipap. Opens eyes to his name. Answers appropriately to building and city, but otherwise not oriented to time or situation. When asked further questions such as if he's in pain, what his medical situation is, etc..he only answers "[place]". He is unable to participate in [place] discussion.  Spoke with patient's sister Katharine Look and patient's friend Louie Casa [place] via telephone. Per Louie Casa patient has declined medical interventions since May, telling Louie Casa that he knows he is dying and he is ready to go. Louie Casa notes patient has been sleeping on the floor, unable to ambulate, urinating in cups. Patient would not let Louie Casa take him to the hospital, stating he was dying and he was "ready to go". Louie Casa notes patient has been suffering with difficulty breathing and patient doesn't not want to suffer any longer. Patient has made funeral arrangements.   Discussed patient's status and stated wishes per Louie Casa with patient's sister- Gean Maidens. Katharine Look is in agreement that patient would only want comfort measures- would not  want bipap, IV antibiotics, IV fluids or other life prolonging measures.  The difference between aggressive medical intervention and comfort care was considered in light of the patient's goals of care.   Advanced directives, concepts specific to code status, artifical feeding and hydration, and rehospitalization were considered and discussed. Katharine Look states desire for DNR, no artificial feeding or hydration, and supporting patient through comfortable dying process with symptom management to best meet his goals of care.  Primary Decision Maker NEXT OF KIN - patient's sister- Gean Maidens    SUMMARY OF RECOMMENDATIONS -DNR -Transition to full comfort measures -Morphine drip- 61m/hr; 239mbolus q 1037mprn for SOB, air hunger, bolus before discontinuing bipap -lorazepam 2mg44mhr prn - give one dose prior to discontinuing bipap  Code Status/Advance Care Planning:  DNR   Palliative Prophylaxis:   Frequent Pain Assessment  Additional Recommendations (Limitations, Scope, Preferences):  Full Comfort Care  Psycho-social/Spiritual:   Desire for further Chaplaincy support:no  Prognosis:    Hours - Days  Discharge Planning: Anticipated Hospital Death  Primary Diagnoses: Present on Admission: . Sepsis secondary to UTI (HCC)[place] HeathType II diabetes mellitus, uncontrolled (HCC)[place] hypertension . Hyperlipidemia . PULMONARY FIBROSIS ILD POST INFLAMMATORY CHRONIC . Rheumatoid arthritis (HCC)[place]iaNSTEMI (non-ST elevated myocardial infarction) (HCC)[place]tonMetastasis from malignant  neoplasm of lung ([place]) . Acute on chronic respiratory failure with hypoxia ([place]) . Pulmonary nodules . Hypernatremia . Dehydration . Anemia . Hypokalemia   I have reviewed the medical record, interviewed the patient and family, and examined the patient. The following aspects are pertinent.  Past Medical History:  Diagnosis Date  . COPD (chronic obstructive pulmonary disease) ([place])   . Diabetes mellitus  without complication ([place])   . Dyspnea   . GERD (gastroesophageal reflux disease)   . Hypertension   . Pulmonary fibrosis ([place])   . Rheumatoid arthritis(714.0) 2005   Social History   Social History  . Marital status: Single    Spouse name: N/A  . Number of children: N/A  . Years of education: N/A   Occupational History  . retired   .  Retired   Social History Main Topics  . Smoking status: Former Smoker    Packs/day: 0.50    Years: 39.00    Start date: 02/27/1969    Quit date: 01/25/2008  . Smokeless tobacco: Never Used  . Alcohol use No  . Drug use: No  . Sexual activity: No   Other Topics Concern  . None   Social History Narrative   Single   Exercise: NO       Pulmonary:   Originally from [place]. Has previously lived in [place]. He attended school in [place]. He served in [place]. 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 Has a cat at home. No bird exposure. No mold exposure.       Family History  Problem Relation Age of Onset  . Cancer Father   . Rheum arthritis Sister   . Lung disease Neg Hx    Scheduled Meds: . aspirin EC  81 mg Oral Daily  . budesonide (PULMICORT) nebulizer solution  0.25 mg Nebulization BID  . buPROPion  300 mg Oral Daily  . carvedilol  3.125 mg Oral BID WC  . gabapentin  800 mg Oral TID  . insulin aspart  0-9 Units Subcutaneous TID WC  . ipratropium  0.5 mg Nebulization TID  . levalbuterol  0.63 mg Nebulization TID  . potassium chloride  40 mEq Oral Once  . pravastatin  40 mg Oral QHS   Continuous Infusions: . dextrose 5 % and 0.45% NaCl 1,000 mL infusion 100 mL/hr at 2016-12-01 0954  . heparin 1,250 Units/hr (2016/12/01 0951)  . levETIRAcetam Stopped (12-01-2016 0950)  . piperacillin-tazobactam (ZOSYN)  IV Stopped (2016/12/01 9774)  . sodium chloride    . vancomycin Stopped (12/01/16 1043)   PRN Meds:.iopamidol, ipratropium, levalbuterol, LORazepam, morphine  injection, ondansetron **OR** ondansetron (ZOFRAN) IV Medications Prior to Admission:  Prior to Admission medications   Medication Sig Start Date End Date Taking? Authorizing Provider  albuterol (VENTOLIN HFA) 108 (90 BASE) MCG/ACT inhaler Inhale 2 puffs into the lungs every 4 (four) hours as needed for wheezing or shortness of breath. 11/20/13   Shawnee Knapp, MD  aspirin 81 MG tablet Take 81 mg by mouth daily.    [provider]  azithromycin (ZITHROMAX) 250 MG tablet Take as directed 06/20/16   Brand Males, MD  Blood Glucose Monitoring Suppl (BAYER CONTOUR MONITOR) w/Device KIT     Sig: TEST BLOOD SUGAR DAILY. DX CODE: E11.65  12/15/15   Pamelia Hoit, MD  buPROPion (WELLBUTRIN XL) 150 MG 24 hr tablet Take 2 tablets (300 mg total) by mouth daily. 12/08/15  [place], Kathryn A, NP  gabapentin (NEURONTIN) 400 MG capsule TAKE 2 CAPSULES (800 MG TOTAL) BY MOUTH 3 (THREE) TIMES DAILY. 06/04/16   Shawnee Knapp, MD  glucose blood (BAYER CONTOUR NEXT TEST) test strip TEST BLOOD SUGAR DAILY. DX CODE: E11.65 12/15/15   Pamelia Hoit, MD  ipratropium-albuterol (DUONEB) 0.5-2.5 (3) MG/3ML SOLN TAKE 3 MLS BY NEBULIZATION EVERY 4 (FOUR) HOURS AS NEEDED. Patient taking differently: TAKE 3 MLS BY NEBULIZATION EVERY 4 (FOUR) HOURS AS NEEDED FOR WHEEZING 09/27/15   Shawnee Knapp, MD  losartan (COZAAR) 50 MG tablet TAKE 1 TABLET BY MOUTH EVERY DAY FOR BLOOD PRESSURE 08/19/16   Shawnee Knapp, MD  meloxicam (MOBIC) 7.5 MG tablet Take 1 tablet (7.5 mg total) by mouth daily as needed for pain. Do not use with any other otc pain medication other than acetaminophen 03/18/15   Shawnee Knapp, MD  metFORMIN (GLUCOPHAGE) 500 MG tablet Take 1 tablet (500 mg total) by mouth 2 (two) times daily with a meal. **Need office visit for any further refills** 10/20/16   Shawnee Knapp, MD  mycophenolate (CELLCEPT) 500 MG tablet TAKE 2 TABLETS TWICE DAILY 1 HOUR BEFORE OR 2 HOURS  AFTER  MEAL 08/23/15   Brand Males, MD  pravastatin  (PRAVACHOL) 40 MG tablet TAKE 1 TABLET BY MOUTH AT BEDTIME 05/21/16   Harrison Mons, PA-C  pravastatin (PRAVACHOL) 40 MG tablet TAKE 1 TABLET BY MOUTH AT BEDTIME 08/19/16   Shawnee Knapp, MD  predniSONE (DELTASONE) 10 MG tablet 40 mg x1 day, then 30 mg x1 day, then 20 mg x1 day, then 10 mg x1 day, and then 5 mg x1 day and stop 06/20/16   Brand Males, MD  sulfamethoxazole-trimethoprim (BACTRIM DS,SEPTRA DS) 800-160 MG tablet TAKE 1 TABLET BY MOUTH 3 (THREE) TIMES A WEEK. 09/03/16   Brand Males, MD   No Known Allergies Review of Systems  Unable to perform ROS: Mental status change    Physical Exam  Constitutional: He appears well-developed. No distress.  Pulmonary/Chest:  On bipap  Neurological:  Lethargic, Opens eyes, doesn't follow commands, oriented only to person and place  Skin: He is diaphoretic.  Nursing note and vitals reviewed.   Vital Signs: BP 109/71   Pulse 75   Temp 98.7 F (37.1 C) (Axillary)   Resp 19   Ht '5\' 10"'  (1.778 m)   Wt 83.9 kg (185 lb)   SpO2 97%   BMI 26.54 kg/m  Pain Assessment: Faces   Pain Score: 0-No pain   SpO2: SpO2: 97 % O2 Device:SpO2: 97 % O2 Flow Rate: .   IO: Intake/output summary:  Intake/Output Summary (Last 24 hours) at 12-Dec-2016 1526 Last data filed at December 12, 2016 1200  Gross per 24 hour  Intake             5665 ml  Output                0 ml  Net             5665 ml    LBM:   Baseline Weight: Weight: 83.9 kg (185 lb) Most recent weight: Weight: 83.9 kg (185 lb)     Palliative Assessment/Data: PPS: 10%     Thank you for this consult. Palliative medicine will continue to follow and assist as needed.   Time In: 1430 Time Out: 1600 Time Total: 90 minutes Greater than 50%  of this time was spent counseling and coordinating care related to the above assessment  and plan.  Signed by: Mariana Kaufman, AGNP-C Palliative Medicine    Please contact Palliative Medicine Team phone at [phone] for questions and concerns.    For individual provider: See Shea Evans

## 2016-11-24 NOTE — Progress Notes (Signed)
EEG completed; results pending.    

## 2016-11-24 NOTE — ED Notes (Signed)
Attempted to call report

## 2016-11-24 NOTE — ACP (Advance Care Planning) (Signed)
No charge note:   Advanced directives, concepts specific to code status, artifical feeding and hydration, and rehospitalization were considered and discussed. Katharine Look states desire for DNR, no artificial feeding or hydration, and supporting patient through comfortable dying process with symptom management to best meet his goals of care.  Mariana Kaufman, AGNP-C Palliative Medicine  Please call Palliative Medicine team phone with any questions [phone]. For individual providers please see AMION.

## 2016-11-24 NOTE — Progress Notes (Signed)
ANTICOAGULATION CONSULT NOTE - Initial Consult  Pharmacy Consult for Heparin  Indication: chest pain/ACS  No Known Allergies  Patient Measurements: Height: 5\' 10"  (177.8 cm) Weight: 185 lb (83.9 kg) IBW/kg (Calculated) : 73  Vital Signs: Temp: 98.6 F (37 C) (08/21 2036) Temp Source: Rectal (08/21 2036) BP: 112/67 (08/21 2345) Pulse Rate: 102 (08/21 2345)  Labs:  Recent Labs  11/05/2016 2035 10/30/2016 2045  HGB 12.5* 13.3  HCT 39.6 39.0  PLT 527*  --   CREATININE 1.26* 1.10  CKTOTAL 179  --     Estimated Creatinine Clearance: 67.3 mL/min (by C-G formula based on SCr of 1.1 mg/dL).   Medical History: Past Medical History:  Diagnosis Date  . COPD (chronic obstructive pulmonary disease) ([place])   . Diabetes mellitus without complication ([place])   . Dyspnea   . GERD (gastroesophageal reflux disease)   . Hypertension   . Pulmonary fibrosis ([place])   . Rheumatoid arthritis(714.0) 2005   Assessment: 66 y/o M here with shortness of breath, found to have elevated troponin, starting heparin, CBC/renal function ok, PTA meds reviewed.   Goal of Therapy:  Heparin level 0.3-0.7 units/ml Monitor platelets by anticoagulation protocol: Yes   Plan:  Heparin 4000 units BOLUS Start heparin drip at 1000 units/hr 0800 HL Daily CBC/HL Monitor for bleeding   Narda Bonds 2016/11/27,12:26 AM

## 2016-11-24 NOTE — Progress Notes (Signed)
Friend at bedside, patient cessation of breaths and bradyed down to No heart rate on monitor. . Listened for full minute without any beat auscultated. Dr Hal Hope aware.

## 2016-11-24 NOTE — Progress Notes (Signed)
PROGRESS NOTE    [name]  WIO:[id number] DOB: [birth year] DOA: 10/30/2016 PCP: Shawnee Knapp, MD    Brief Narrative:  Patient is a 66 year old gentleman history of COPD, type 2 diabetes, dyspnea, gastroesophageal reflux disease, hypertension, pulmonary fibrosis, rheumatoid arthritis, concern for metastatic disease presented to the ED after friend found him on the floor altered with dyspnea and urinary incontinence. Patient also noted to be hypoxic. Patient noted to have an enlarging cavitary lesion on CT chest. Patient also noted to have elevated troponins likely consistent with non-STEMI. Patient on BiPAP. Patient also noted to have metastatic disease. Pulmonary and cardiology and palliative care consulted.   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 Patient has been pancultured. Continue IV fluids. Continue empiric IV vancomycin and IV Zosyn. Follow.  #2 acute on chronic respiratory failure with hypoxia Patient noted to be hypoxic on admission. Patient with no wheezing noted on exam. CT angiogram chest is negative for pulmonary emboli however does have a 6.9 x 4.3 cm cavitating mass in the right middle lobe which has significantly increased in size compared to prior exam  and consistent with malignancy. Patient with right peritracheal adenopathy as well as bilateral pulmonary nodules concerning for metastatic disease. Patient with mildly displaced bilateral rib fractures, lytic lesions noted in the left clavicle and lower thoracic vertebral body consistent with metastatic disease. CT pelvis also consistent with osseous metastatic disease. CT head consistent with lytic lesions involving right parietal skull near the vertex. Patient currently on BiPAP. Patient was being followed by pulmonary in the outpatient setting for pulmonary nodules with surgical pathology which was negative in September 2017. Patient interested in pulmonary evaluation. Will place on Pulmicort, scheduled nebs. Continue BiPAP when necessary. Trial off BiPAP today. Consult with pulmonary and critical care medicine for further evaluation and management.  #3 non-STEMI Patient with elevated troponins. Check a 2-D echo. Repeat EKG. Continue IV heparin. Will place on aspirin, Coreg. Resume home regimen Pravachol. Check a fasting lipid panel. Cardiology consultation for further evaluation and management.  #4 UTI Urine cultures pending. On empiric IV Zosyn.  #5 hyperlipidemia Check a fasting lipid panel. Continue Pravachol.  #6 enlarging cavitary lung mass with lytic metastases Patient with enlarging cavitary lung mass noted on CT chest with osseous/lytic lesions noted on CT pelvis and CT head. Patient was being followed by pulmonary for pulmonary nodules which were biopsied September 2017 which were essentially negative at that time. Patient now with what seems like metastatic disease. Patient interested in further evaluation by pulmonary. Consult with pulmonary for further evaluation and management. Consulted palliative care for goals of care as patient with metastatic disease with a likely poor prognosis.  #7 hypernatremia Likely secondary to dehydration. Placed on half-normal saline. IV fluids. Monitor  closely for volume overload. 2-D  echo pending.  #8 dehydration Gentle hydration.  #9 hypokalemia Replete.  #10 anemia Likely secondary to metastatic disease. Check an anemia panel. Transfusion threshold hemoglobin less than 8 as patient now with a non-STEMI. Follow H&H.  #11 diabetes mellitus type 2 Check a hemoglobin A1c. Sliding scale insulin.  #12 rheumatoid arthritis Patient seems to have been on a prednisone taper. Patient status post 1 dose of IV Solu-Medrol 1. Monitor.  #13 chronic respiratory failure on chronic home O2/COPD Currently on BiPAP. Place on Xopenex and Atrovent scheduled nebs. Place on Pulmicort. Pulmonary consultation pending.  #14 gastroesophageal reflux disease PPI.  #15 altered mental status   questionable etiology. Patient was found down on the floor with urinary incontinence. Concerns for seizures as patient noted to have metastatic disease. CT head with lytic lesion however negative intracranial abnormalities. Check MRI of the head. Check a EEG. Place empirically on IV Keppra for seizure prophylaxis.  DVT prophylaxis: Heparin Code Status: Full Family Communication: No family at bedside. Updated patient. Disposition Plan: Remain in the step down unit.   Consultants:   PCCM pending  Cardiology pending  Palliative care pending  Procedures:   CT head 11/03/2016  CT angiogram chest 11/02/2016  CT renal stone protocol 10/25/2016  Antimicrobials:   IV Zosyn 10/27/2016  IV vancomycin 11/02/2016   Subjective: Sleeping on BiPAP. Easily arousable. Alert to self only. Not sure why he is in the hospital. Patient states not sure what happened at home. Patient denies any chest pain. No shortness of breath. No abdominal pain.  Objective: Vitals:   Nov 18, 2016 0100 2016/11/18 0321 18-Nov-2016 0339 Nov 18, 2016 0751  BP: (!) 133/92  123/89   Pulse: 94 92 91   Resp: (!) 21 (!) 25 (!) 28   Temp: 97.8 F (36.6 C)  98.7 F (37.1 C) 98.4 F (36.9 C)    TempSrc: Axillary  Axillary Axillary  SpO2: 98% 96% 96%   Weight:      Height:        Intake/Output Summary (Last 24 hours) at 2016-11-18 0954 Last data filed at 11/18/2016 0049  Gross per 24 hour  Intake             5150 ml  Output                0 ml  Net             5150 ml   Filed Weights   11/15/2016 2100  Weight: 83.9 kg (185 lb)    Examination:  General exam: On BIPAP.Extremely dry mucous membranes. Respiratory system: . Coarse rhonchi gross breath sounds. No wheezing. Cardiovascular system: S1 & S2 heard, RRR. No JVD, murmurs, rubs, gallops or clicks. No pedal edema. Gastrointestinal system: Abdomen is nondistended, soft and nontender. No organomegaly or masses felt. Normal bowel sounds heard. Central nervous system: Sleeping however easily arousable. Alert to self. No focal neurological deficits. Extremities: Moving extremities spontaneously. Skin: No rashes, lesions or ulcers Psychiatry: Judgement and insight appear fair. Mood & affect appropriate.     Data Reviewed: I have personally reviewed following labs and imaging studies  CBC:  Recent Labs Lab 11/23/2016 2035 11/08/2016 2045 2016-11-18 0722  WBC 25.4*  --  22.4*  NEUTROABS 22.8*  --  19.6*  HGB 12.5* 13.3 10.0*  HCT 39.6 39.0 33.1*  MCV 90.4  --  90.9  PLT 527*  --  829   Basic Metabolic Panel:  Recent Labs Lab 11/10/2016 2035 10/28/2016 2045 Nov 18, 2016 0206 2016-11-18 9371  NA 144 144  --  147*  K 2.8* 2.7*  --  3.4*  CL 101 101  --  112*  CO2 26  --   --  26  GLUCOSE 164* 165*  --  115*  BUN 40* 42*  --  35*  CREATININE 1.26* 1.10  --  0.94  CALCIUM 12.4*  --   --  10.4*  MG  --   --  2.4 2.1  PHOS  --   --  2.5 3.1   GFR: Estimated Creatinine Clearance: 78.7 mL/min (by C-G formula based on SCr of 0.94 mg/dL). Liver Function Tests:  Recent Labs Lab 11/11/2016 2035 2016-11-17 0722  AST 58* 33  ALT 35 25  ALKPHOS 148* 106  BILITOT 0.9 0.7  PROT 7.5 5.8*  ALBUMIN 3.0* 2.3*   No results for  input(s): LIPASE, AMYLASE in the last 168 hours. No results for input(s): AMMONIA in the last 168 hours. Coagulation Profile: No results for input(s): INR, PROTIME in the last 168 hours. Cardiac Enzymes:  Recent Labs Lab 11/18/2016 2035 Nov 17, 2016 0033 2016-11-17 0206 17-Nov-2016 0722  CKTOTAL 179  --   --   --   TROPONINI  --  4.60* 4.47* 4.29*   BNP (last 3 results) No results for input(s): PROBNP in the last 8760 hours. HbA1C: No results for input(s): HGBA1C in the last 72 hours. CBG:  Recent Labs Lab 11/04/2016 2034  GLUCAP 164*   Lipid Profile: No results for input(s): CHOL, HDL, LDLCALC, TRIG, CHOLHDL, LDLDIRECT in the last 72 hours. Thyroid Function Tests: No results for input(s): TSH, T4TOTAL, FREET4, T3FREE, THYROIDAB in the last 72 hours. Anemia Panel: No results for input(s): VITAMINB12, FOLATE, FERRITIN, TIBC, IRON, RETICCTPCT in the last 72 hours. Sepsis Labs:  Recent Labs Lab 11/23/2016 2046 11/17/2016 0033 2016-11-17 0211 11-17-2016 0722  LATICACIDVEN 5.74* 3.1* 3.7* 1.6    Recent Results (from the past 240 hour(s))  Culture, blood (routine x 2)     Status: None (Preliminary result)   Collection Time: 11/20/2016  8:33 PM  Result Value Ref Range Status   Specimen Description BLOOD RIGHT ARM  Final   Special Requests   Final    BOTTLES DRAWN AEROBIC AND ANAEROBIC Blood Culture adequate volume   Culture NO GROWTH < 12 HOURS  Final   Report Status PENDING  Incomplete  Culture, blood (routine x 2)     Status: None (Preliminary result)   Collection Time: 10/29/2016  8:44 PM  Result Value Ref Range Status   Specimen Description BLOOD RIGHT ANTECUBITAL  Final   Special Requests   Final    BOTTLES DRAWN AEROBIC AND ANAEROBIC Blood Culture adequate volume   Culture NO GROWTH < 12 HOURS  Final   Report Status PENDING  Incomplete  MRSA PCR Screening     Status: None   Collection Time: 11/17/16  1:36 AM  Result Value Ref Range Status   MRSA by PCR NEGATIVE NEGATIVE Final     Comment:        The GeneXpert MRSA Assay (FDA approved for NASAL specimens only), is one component of a comprehensive MRSA colonization surveillance program. It is not intended to diagnose MRSA infection nor to guide or monitor treatment for MRSA infections.          Radiology Studies: Ct Head Wo Contrast  Result Date: 11/15/2016 CLINICAL DATA:  Found on floor by a friend. Altered level of consciousness. Urinary incontinence and weakness. EXAM: CT HEAD WITHOUT CONTRAST TECHNIQUE: Contiguous  axial images were obtained from the base of the skull through the vertex without intravenous contrast. COMPARISON:  Chest CT exams 12/05/2015 and 06/22/2016. FINDINGS: Brain: No evidence of acute infarction, hemorrhage, hydrocephalus, extra-axial collection or mass lesion/mass effect. Minimal small vessel ischemic disease of periventricular white matter. Small chronic appearing right cerebellar infarct, series 3, image 8. Vascular: No hyperdense vessels. Moderate atherosclerosis of the cavernous carotids bilaterally. Skull: Right parietal slightly expansile lytic lesion measuring approximately 13 x 11 x 11 mm near the vertex of the skull is concerning for lytic metastasis in light of pulmonary findings on prior chest CT exams from 06/22/2016 and 12/05/2015. No acute fracture. Sinuses/Orbits: Evidence of remote right orbital floor repair. No acute sinus disease. Intact globes. Other: None IMPRESSION: 1. There is a 13 x 11 x 11 mm slightly expansile lytic lesion involving the right parietal skull near the vertex. This in conjunction with pulmonary findings on prior CT exams raise concern for lytic metastasis. Less likely posttraumatic or infectious etiologies. 2. Minimal small vessel ischemic disease of periventricular white matter. Chronic tiny right cerebellar infarct. 3. No acute intracranial abnormality or focal mass on this unenhanced study. Electronically Signed   By: Ashley Royalty M.D.   On: 10/28/2016  21:40   Ct Angio Chest Pe W/cm &/or Wo Cm  Result Date: 11/10/2016 CLINICAL DATA:  Shortness of breath. EXAM: CT ANGIOGRAPHY CHEST WITH CONTRAST TECHNIQUE: Multidetector CT imaging of the chest was performed using the standard protocol during bolus administration of intravenous contrast. Multiplanar CT image reconstructions and MIPs were obtained to evaluate the vascular anatomy. CONTRAST:  84 mL of Isovue 370 intravenously. COMPARISON:  Radiograph of same day.  CT scan of June 22, 2016. FINDINGS: Cardiovascular: There is no definite evidence of pulmonary embolus. Atherosclerosis of aorta is noted without aneurysm formation. No pericardial effusion is noted. Mediastinum/Nodes: 5.1 x 3.7 cm right peritracheal mass is noted consistent with metastatic disease. Lungs/Pleura: Emphysematous disease is noted throughout both lungs. 6.9 x 4.3 cm cavitating mass is noted in the right middle lobe consistent with malignancy. 6 mm nodule is noted in right middle lobe best seen on image number 47 of series 7. New 9 mm nodule is noted in left upper lobe best seen on image number 32 of series 7. 9 mm left apical nodule is noted best seen on image number 20 of series 7 which is new and consistent with metastatic disease. Upper Abdomen: Bilateral nonobstructive nephrolithiasis is noted. Musculoskeletal: Mildly displaced right rib fracture is noted. Mildly displaced left posterior rib fracture is noted. Lytic lesion is seen in the medial portion of the left clavicle consistent with metastatic disease. Lytic lesion is noted in lower thoracic vertebral body consistent with metastatic disease. Review of the MIP images confirms the above findings. IMPRESSION: No definite evidence of pulmonary embolus. 6.9 x 4.3 cm cavitating mass is noted in right middle lobe which is significantly increased in size compared to prior exam and consistent with malignancy. Right peritracheal adenopathy is noted as well as bilateral pulmonary nodules are  noted concerning for metastatic disease. Bilateral nonobstructive nephrolithiasis. Mildly displaced bilateral rib fractures are noted. Lytic lesions are noted in the left clavicle and lower thoracic vertebral body consistent with metastatic disease. Aortic Atherosclerosis (ICD10-I70.0) and Emphysema (ICD10-J43.9). Electronically Signed   By: Marijo Conception, M.D.   On: 11/02/2016 21:45   Dg Chest Port 1 View  Result Date: 11/22/2016 CLINICAL DATA:  Hypoxia EXAM: PORTABLE CHEST 1 VIEW COMPARISON:  Chest CT June 22, 2016 and chest radiographs December 11 2016 FINDINGS: There is widespread pulmonary fibrosis. There is an area of consolidation concerning for mass in the anterior segment right upper lobe. This area measures 7.3 x 5.8 cm. Heart size and pulmonary vascularity are normal. There is evidence of right azygos, right paratracheal, and right perihilar adenopathy. There is postoperative change in the lower cervical spine. IMPRESSION: Widespread pulmonary fibrosis. Area of masslike consolidation concerning for neoplasm right upper lobe anterior segment. Adenopathy right hilar, as is common paratracheal regions. Stable cardiac silhouette. Electronically Signed   By: Lowella Grip III M.D.   On: 11/08/2016 21:34   Ct Renal Stone Study  Result Date: 10/31/2016 CLINICAL DATA:  Hematuria. EXAM: CT ABDOMEN AND PELVIS WITHOUT CONTRAST TECHNIQUE: Multidetector CT imaging of the abdomen and pelvis was performed following the standard protocol without IV contrast. COMPARISON:  PET scan of November 18, 2015. FINDINGS: Lower chest: Extensive emphysematous disease is seen in the lung bases. Hepatobiliary: No focal liver abnormality is seen. No gallstones, gallbladder wall thickening, or biliary dilatation. Pancreas: Unremarkable. No pancreatic ductal dilatation or surrounding inflammatory changes. Spleen: Normal in size without focal abnormality. Adrenals/Urinary Tract: Adrenal glands are unremarkable. Stable right  renal cyst is noted. No hydronephrosis or renal obstruction is noted. Urinary bladder is full of contrast, but otherwise unremarkable. Stomach/Bowel: Stomach is within normal limits. Appendix appears normal. No evidence of bowel wall thickening, distention, or inflammatory changes. Vascular/Lymphatic: Aortic atherosclerosis. No enlarged abdominal or pelvic lymph nodes. Reproductive: Prostate is unremarkable. Other: No abdominal wall hernia or abnormality. No abdominopelvic ascites. Musculoskeletal: Lytic lesions are noted in the T11 and T12 vertebral bodies, as well as the right sacrum and right iliac bone consistent with metastatic disease. IMPRESSION: Osseous metastases are noted in the spine and pelvis. Aortic atherosclerosis. No hydronephrosis or renal obstruction is noted. Electronically Signed   By: Marijo Conception, M.D.   On: 10/27/2016 22:40        Scheduled Meds: . aspirin EC  81 mg Oral Daily  . budesonide (PULMICORT) nebulizer solution  0.25 mg Nebulization BID  . buPROPion  300 mg Oral Daily  . carvedilol  3.125 mg Oral BID WC  . gabapentin  800 mg Oral TID  . insulin aspart  0-9 Units Subcutaneous TID WC  . ipratropium  0.5 mg Nebulization Q6H  . levalbuterol  0.63 mg Nebulization Q6H  . potassium chloride  40 mEq Oral Once  . pravastatin  40 mg Oral QHS   Continuous Infusions: . dextrose 5 % and 0.45% NaCl 1,000 mL infusion    . heparin 1,250 Units/hr (15-Nov-2016 0951)  . levETIRAcetam 500 mg (2016/11/15 0935)  . piperacillin-tazobactam (ZOSYN)  IV Stopped (Nov 15, 2016 2831)  . sodium chloride    . vancomycin 1,000 mg (11/15/2016 0943)     LOS: 1 day    Time spent: 55 mins    THOMPSON,DANIEL, MD [facility] Pager [phone]  If 7PM-7AM, please contact night-coverage [email or web address] Password [facility] 2016/11/15, 9:54 AM

## 2016-11-24 NOTE — Progress Notes (Signed)
Spoke with Gean Maidens who is patient's sister and only relative. She stated that he told her that he wanted to be a DNR. I told her about palliative coming to see patient and she said she will be whatever is best for him.  She can be contacted on [phone] at anytime in relation to his care. She lives in [place].  Misheel Gowans RN, BSN,MSN, RN3. Dec 06, 2016 @11 :15am

## 2016-11-24 DEATH — deceased

## 2016-11-26 ENCOUNTER — Other Ambulatory Visit: Payer: Self-pay | Admitting: Family Medicine

## 2018-05-02 IMAGING — CT CT CHEST HIGH RESOLUTION W/O CM
2 of 8 series · 14 of 36 positions shown, 17 images · non-contrast
Comparison: 12/29/2014 high-resolution chest CT.

CLINICAL DATA: Follow-up interstitial lung disease. Chronic
respiratory failure with hypoxia. Rheumatoid arthritis.

EXAM:
CT CHEST WITHOUT CONTRAST
TECHNIQUE: Multidetector CT imaging of the chest was performed following the
standard protocol without intravenous contrast. High resolution
imaging of the lungs, as well as inspiratory and expiratory imaging,
was performed.

[Series 7: high resolution · axial · 0.67mm/px · z∈[-300,-30]mm · 11 of 155 slices shown, 14 images]
[im 10/155  mediastinal]
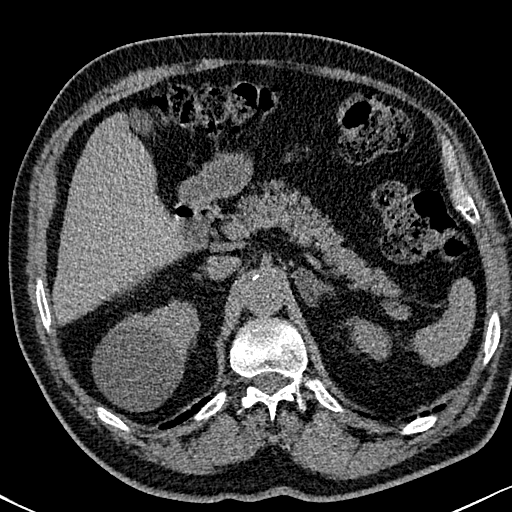
[im 10/155  lung]
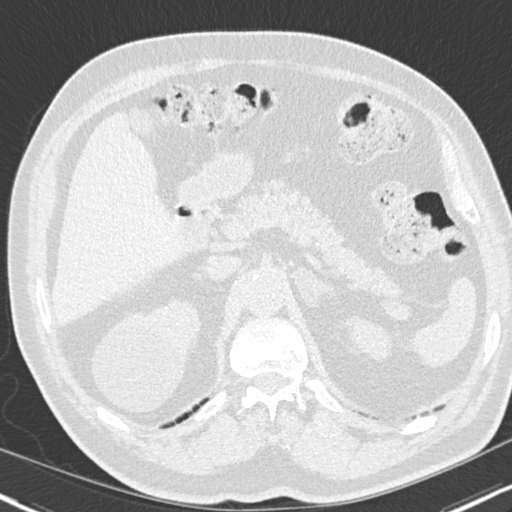
[im 28/155  lung]
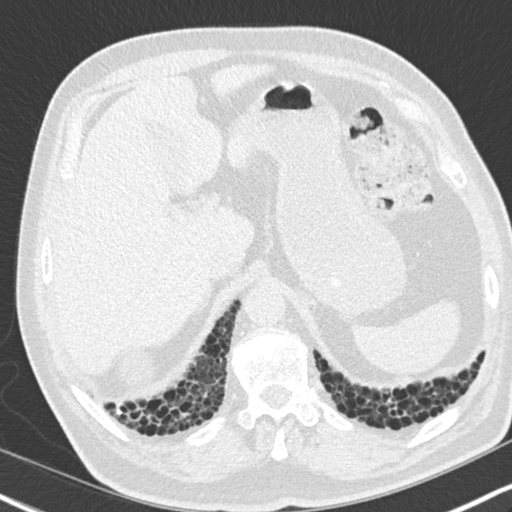
[im 37/155  lung]
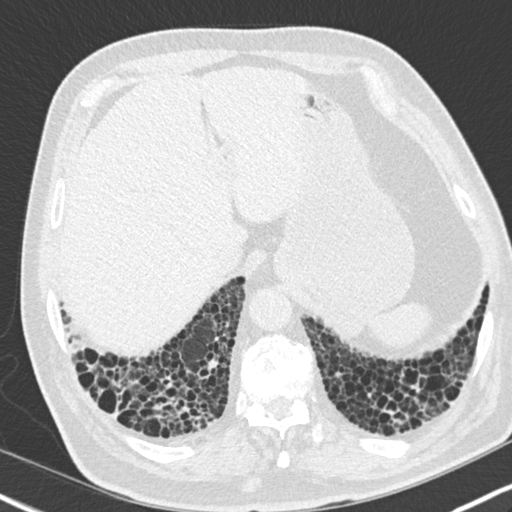
[im 55/155  lung]
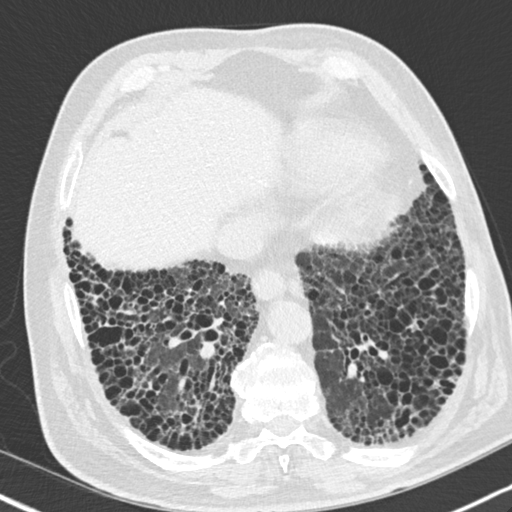
[im 64/155  mediastinal]
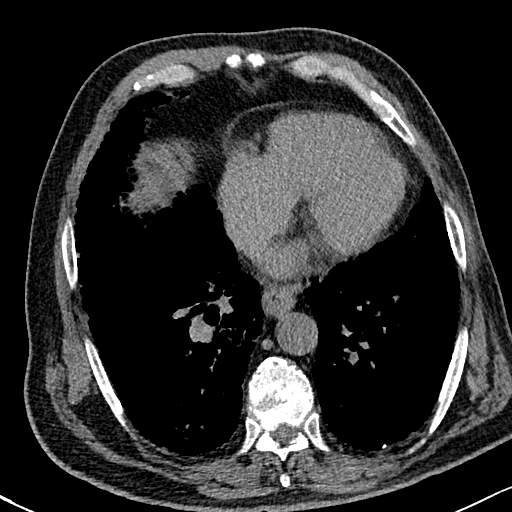
[im 64/155  lung]
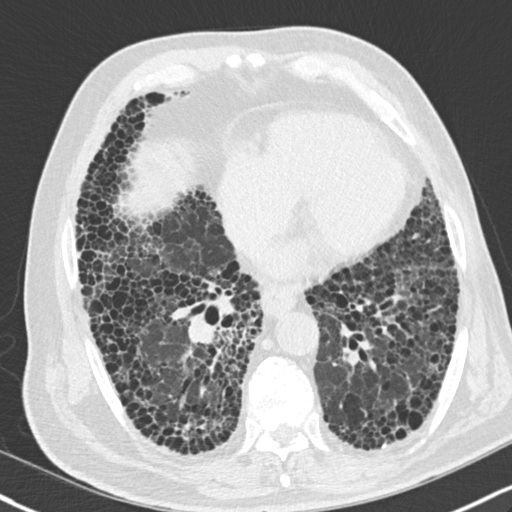
[im 82/155  lung]
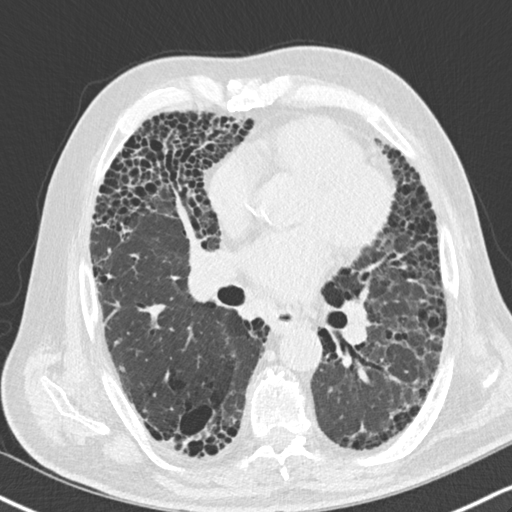
[im 91/155  lung]
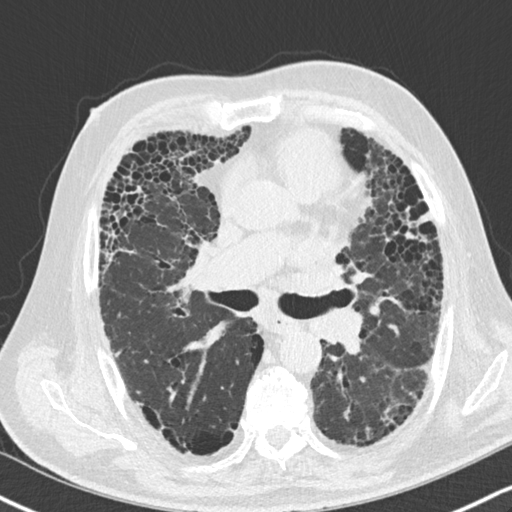
[im 100/155  lung]
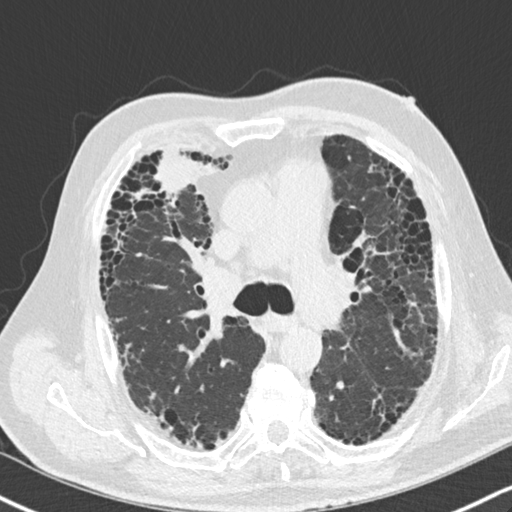
[im 118/155  mediastinal]
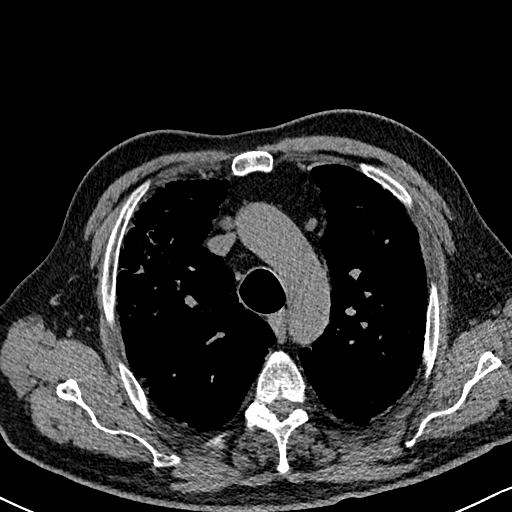
[im 118/155  lung]
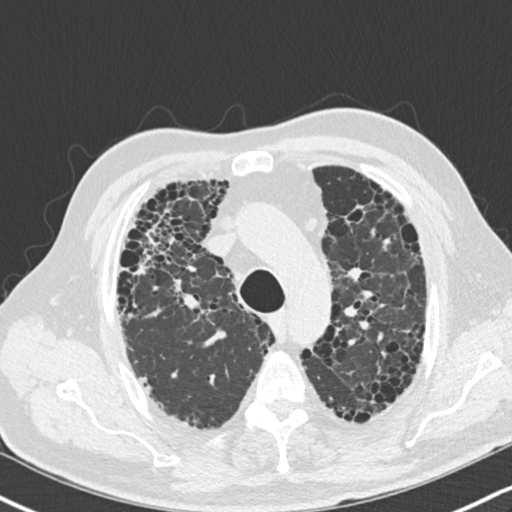
[im 127/155  lung]
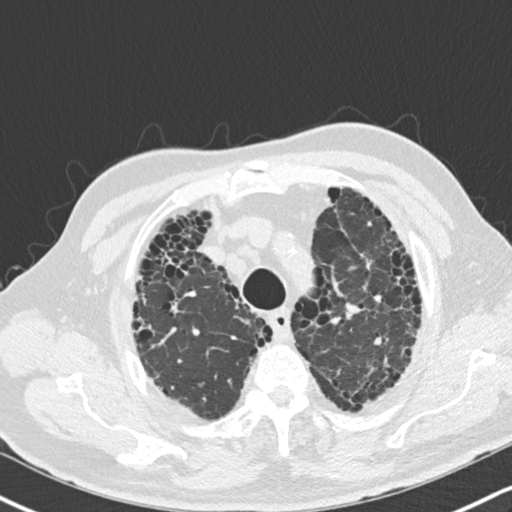
[im 145/155  lung]
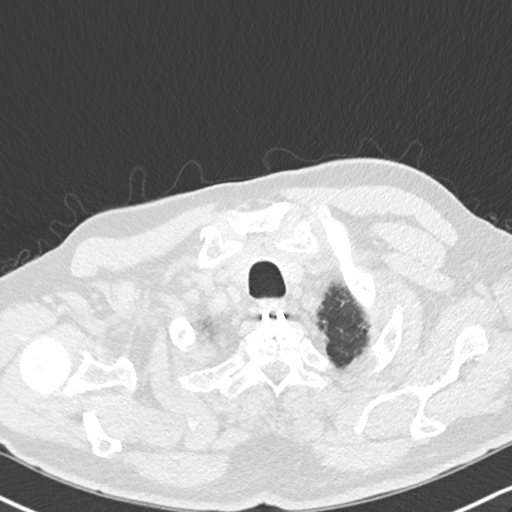

[Series 11: coronal · coronal · 0.59mm/px · 3 of 148 slices shown]
[im 30/148  lung]
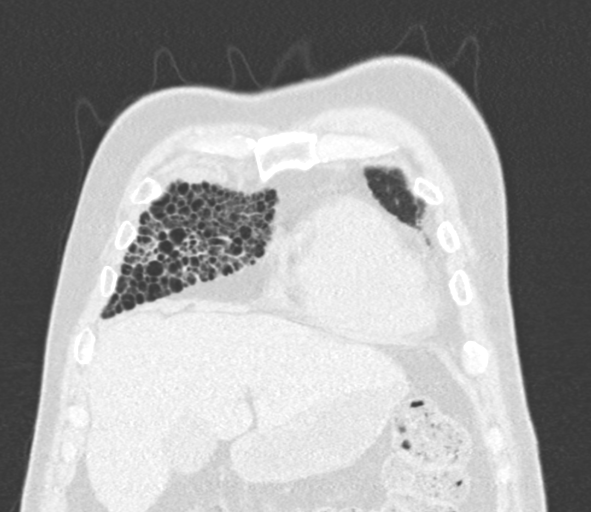
[im 59/148  lung]
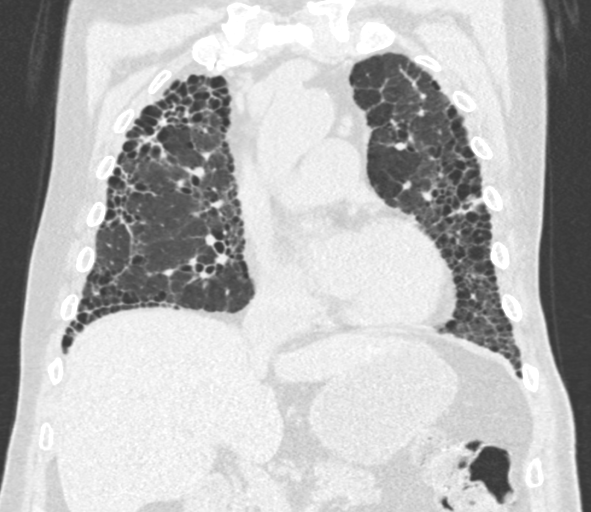
[im 89/148  lung]
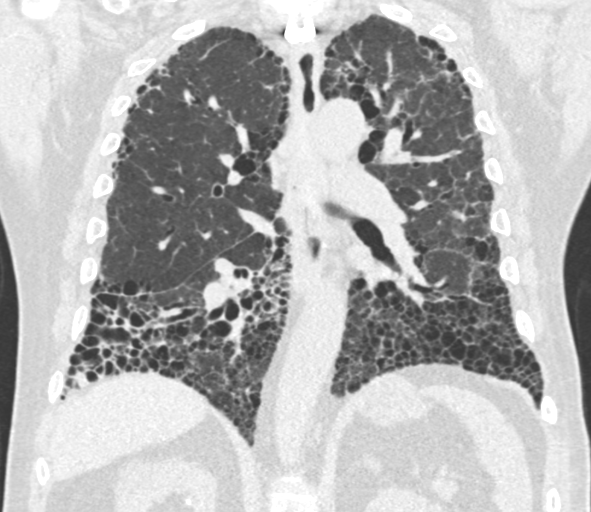

[14 of 36 positions shown; findings below may reference images not displayed]

FINDINGS: Mediastinum/Nodes: Normal heart size. No significant pericardial
fluid/thickening. Left anterior descending coronary atherosclerosis.
Atherosclerotic nonaneurysmal thoracic aorta. Normal caliber
pulmonary arteries. No discrete thyroid nodules. Unremarkable
esophagus. No axillary adenopathy. Stable mild right paratracheal
lymphadenopathy measuring up to 1.4 cm (series 7/image 48). Stable
mild AP window lymphadenopathy measuring up to 1.2 cm (series 2/
image 46). Stable mildly enlarged 1.1 cm subcarinal node (series 7/
image 72). No new pathologically enlarged mediastinal or gross hilar
nodes on this noncontrast study.

Lungs/Pleura: No pneumothorax. No pleural effusion. There is
peripheral basilar predominant subpleural reticulation and traction
bronchiectasis throughout both lungs. There is severe honeycombing
throughout both lungs, largely replacing the bilateral lower lobes,
right middle lobe and lingula with lesser involvement of the upper
lobes. Findings have not appreciably changed in the interval,
although have significantly progressed compared to the oldest chest
CT from 12/19/2007. There is a new 4.1 x 2.9 cm masslike focus of
consolidation in the basilar right upper lobe (series 8/ image 54).
No additional new foci of consolidation or significant pulmonary
nodules.

Upper abdomen: Hypodense 0.6 cm right liver lesion (Series 7/ image
112), too small to characterize, not definitely seen on prior CT
studies. Partially visualize simple appearing 5.9 cm upper right
renal cyst. Stable thickening of the adrenal glands without discrete
adrenal nodule.

Musculoskeletal: No aggressive appearing focal osseous lesions.
Marked thoracic spondylosis. Partially visualized surgical hardware
from ACDF in the lower cervical spine.
IMPRESSION: 1. New masslike focus of consolidation in the basilar right upper
lobe measuring 4.1 x 2.9 cm, indeterminate. Differential includes
neoplasm or infection. Consider one of the following in 3 months for
both low-risk and high-risk individuals: (a) repeat chest CT, (b)
follow-up PET-CT, or (c) tissue sampling. This recommendation
follows the consensus statement: Guidelines for Management of
Incidental Pulmonary Nodules Detected on CT Images:From the
[HOSPITAL] 3537; published online before print
(10.1148/radiol.[id number]).
2. No appreciable interval change in advanced fibrotic interstitial
lung disease diagnostic of usual interstitial pneumonia (UIP).
3. Mild mediastinal lymphadenopathy, not appreciably changed,
nonspecific.
4. Aortic atherosclerosis.  One vessel coronary atherosclerosis.
These results will be called to the ordering clinician or
representative by the Radiologist Assistant, and communication
documented in the PACS or zVision Dashboard.
